# Patient Record
Sex: Male | Born: 1955 | Race: Black or African American | Hispanic: No | Smoking: Current some day smoker
Health system: Southern US, Community
[De-identification: ages and names within clinical notes are randomized; demographics above are authoritative.]

## PROBLEM LIST (undated history)

## (undated) DIAGNOSIS — R5383 Other fatigue: Secondary | ICD-10-CM

## (undated) DIAGNOSIS — R55 Syncope and collapse: Secondary | ICD-10-CM

## (undated) DIAGNOSIS — K219 Gastro-esophageal reflux disease without esophagitis: Secondary | ICD-10-CM

## (undated) DIAGNOSIS — Z87891 Personal history of nicotine dependence: Secondary | ICD-10-CM

## (undated) DIAGNOSIS — Q828 Other specified congenital malformations of skin: Secondary | ICD-10-CM

## (undated) DIAGNOSIS — R21 Rash and other nonspecific skin eruption: Secondary | ICD-10-CM

## (undated) DIAGNOSIS — R5381 Other malaise: Secondary | ICD-10-CM

## (undated) DIAGNOSIS — I10 Essential (primary) hypertension: Secondary | ICD-10-CM

## (undated) DIAGNOSIS — S022XXA Fracture of nasal bones, initial encounter for closed fracture: Secondary | ICD-10-CM

## (undated) DIAGNOSIS — R259 Unspecified abnormal involuntary movements: Secondary | ICD-10-CM

## (undated) DIAGNOSIS — S139XXA Sprain of joints and ligaments of unspecified parts of neck, initial encounter: Secondary | ICD-10-CM

## (undated) DIAGNOSIS — M5412 Radiculopathy, cervical region: Secondary | ICD-10-CM

## (undated) DIAGNOSIS — R209 Unspecified disturbances of skin sensation: Secondary | ICD-10-CM

## (undated) DIAGNOSIS — F1020 Alcohol dependence, uncomplicated: Secondary | ICD-10-CM

## (undated) DIAGNOSIS — M79609 Pain in unspecified limb: Secondary | ICD-10-CM

## (undated) DIAGNOSIS — J45909 Unspecified asthma, uncomplicated: Secondary | ICD-10-CM

## (undated) HISTORY — DX: Other specified congenital malformations of skin: Q82.8

## (undated) HISTORY — DX: Essential (primary) hypertension: I10

## (undated) HISTORY — DX: Radiculopathy, cervical region: M54.12

## (undated) HISTORY — DX: Other fatigue: R53.83

## (undated) HISTORY — DX: Fracture of nasal bones, initial encounter for closed fracture: S02.2XXA

## (undated) HISTORY — DX: Alcohol dependence, uncomplicated: F10.20

## (undated) HISTORY — DX: Unspecified abnormal involuntary movements: R25.9

## (undated) HISTORY — DX: Syncope and collapse: R55

## (undated) HISTORY — DX: Unspecified disturbances of skin sensation: R20.9

## (undated) HISTORY — DX: Unspecified asthma, uncomplicated: J45.909

## (undated) HISTORY — DX: Personal history of nicotine dependence: Z87.891

## (undated) HISTORY — DX: Sprain of joints and ligaments of unspecified parts of neck, initial encounter: S13.9XXA

## (undated) HISTORY — DX: Other malaise: R53.81

## (undated) HISTORY — DX: Rash and other nonspecific skin eruption: R21

## (undated) HISTORY — DX: Pain in unspecified limb: M79.609

## (undated) HISTORY — DX: Gastro-esophageal reflux disease without esophagitis: K21.9

---

## 2004-10-27 ENCOUNTER — Ambulatory Visit: Payer: Self-pay | Admitting: Internal Medicine

## 2005-05-03 ENCOUNTER — Ambulatory Visit: Payer: Self-pay | Admitting: Internal Medicine

## 2005-08-03 ENCOUNTER — Ambulatory Visit: Payer: Self-pay | Admitting: Internal Medicine

## 2006-09-05 ENCOUNTER — Ambulatory Visit: Payer: Self-pay | Admitting: Internal Medicine

## 2006-09-05 LAB — CONVERTED CEMR LAB
BUN: 7 mg/dL (ref 6–23)
Basophils Absolute: 0 10*3/uL (ref 0.0–0.1)
CO2: 24 meq/L (ref 19–32)
Calcium: 10.1 mg/dL (ref 8.4–10.5)
Chloride: 96 meq/L (ref 96–112)
Creatinine, Ser: 1.1 mg/dL (ref 0.4–1.5)
H Pylori IgG: NEGATIVE
Hemoglobin: 14.5 g/dL (ref 13.0–17.0)
Leukocytes, UA: NEGATIVE
Lymphocytes Relative: 14 % (ref 12.0–46.0)
MCHC: 33.5 g/dL (ref 30.0–36.0)
MCV: 92.7 fL (ref 78.0–100.0)
Monocytes Absolute: 1 10*3/uL — ABNORMAL HIGH (ref 0.2–0.7)
Monocytes Relative: 9.7 % (ref 3.0–11.0)
Neutrophils Relative %: 75.3 % (ref 43.0–77.0)
Potassium: 3.3 meq/L — ABNORMAL LOW (ref 3.5–5.1)
TSH: 1.22 microintl units/mL (ref 0.35–5.50)
Total Protein, Urine: 100 mg/dL — AB
Urine Glucose: NEGATIVE mg/dL
Urobilinogen, UA: 0.2 (ref 0.0–1.0)

## 2006-09-15 ENCOUNTER — Ambulatory Visit: Payer: Self-pay | Admitting: Internal Medicine

## 2007-04-18 ENCOUNTER — Ambulatory Visit: Payer: Self-pay | Admitting: Internal Medicine

## 2007-10-15 ENCOUNTER — Ambulatory Visit: Payer: Self-pay | Admitting: Internal Medicine

## 2007-10-15 DIAGNOSIS — K219 Gastro-esophageal reflux disease without esophagitis: Secondary | ICD-10-CM | POA: Insufficient documentation

## 2007-10-16 LAB — CONVERTED CEMR LAB
Albumin: 4.4 g/dL (ref 3.5–5.2)
Alkaline Phosphatase: 77 units/L (ref 39–117)
BUN: 7 mg/dL (ref 6–23)
Basophils Absolute: 0 10*3/uL (ref 0.0–0.1)
Bilirubin Urine: NEGATIVE
Eosinophils Absolute: 0 10*3/uL (ref 0.0–0.6)
GFR calc Af Amer: 102 mL/min
HDL: 74.7 mg/dL (ref >39.0)
Hemoglobin, Urine: NEGATIVE
Hemoglobin: 13.4 g/dL (ref 13.0–17.0)
Leukocytes, UA: NEGATIVE
Lymphocytes Relative: 17.4 % (ref 12.0–46.0)
MCHC: 33.5 g/dL (ref 30.0–36.0)
Monocytes Absolute: 0.6 10*3/uL (ref 0.2–0.7)
Monocytes Relative: 8.5 % (ref 3.0–11.0)
Neutro Abs: 4.9 10*3/uL (ref 1.4–7.7)
Potassium: 3.5 meq/L (ref 3.5–5.1)
TSH: 0.94 microintl units/mL (ref 0.35–5.50)
Triglycerides: 199 mg/dL — ABNORMAL HIGH (ref 0–149)
VLDL: 40 mg/dL (ref 0–40)
pH: 5 (ref 5.0–8.0)

## 2007-10-24 ENCOUNTER — Telehealth: Payer: Self-pay | Admitting: Internal Medicine

## 2007-12-21 ENCOUNTER — Encounter: Payer: Self-pay | Admitting: Internal Medicine

## 2007-12-21 ENCOUNTER — Ambulatory Visit: Payer: Self-pay | Admitting: Internal Medicine

## 2007-12-27 ENCOUNTER — Ambulatory Visit: Payer: Self-pay | Admitting: Internal Medicine

## 2008-01-16 ENCOUNTER — Encounter: Payer: Self-pay | Admitting: Internal Medicine

## 2008-01-23 ENCOUNTER — Encounter: Payer: Self-pay | Admitting: Internal Medicine

## 2008-01-29 ENCOUNTER — Ambulatory Visit: Payer: Self-pay | Admitting: Internal Medicine

## 2008-03-13 ENCOUNTER — Encounter: Payer: Self-pay | Admitting: Internal Medicine

## 2008-04-02 ENCOUNTER — Ambulatory Visit: Payer: Self-pay | Admitting: Internal Medicine

## 2008-05-02 ENCOUNTER — Ambulatory Visit: Payer: Self-pay | Admitting: Internal Medicine

## 2008-09-17 ENCOUNTER — Telehealth: Payer: Self-pay | Admitting: Internal Medicine

## 2008-09-22 ENCOUNTER — Ambulatory Visit: Payer: Self-pay | Admitting: Internal Medicine

## 2008-11-19 ENCOUNTER — Telehealth: Payer: Self-pay | Admitting: Internal Medicine

## 2009-07-14 ENCOUNTER — Ambulatory Visit: Payer: Self-pay | Admitting: Internal Medicine

## 2009-09-06 ENCOUNTER — Inpatient Hospital Stay (HOSPITAL_COMMUNITY): Admission: EM | Admit: 2009-09-06 | Discharge: 2009-09-09 | Payer: Self-pay | Admitting: Emergency Medicine

## 2009-09-15 ENCOUNTER — Ambulatory Visit: Payer: Self-pay | Admitting: Internal Medicine

## 2009-09-17 LAB — CONVERTED CEMR LAB
Albumin: 4.1 g/dL (ref 3.5–5.2)
Alkaline Phosphatase: 53 units/L (ref 39–117)
Bilirubin, Direct: 0.1 mg/dL (ref 0.0–0.3)
Calcium: 9.8 mg/dL (ref 8.4–10.5)
Creatinine, Ser: 0.9 mg/dL (ref 0.4–1.5)
Folate: >20 ng/mL
GFR calc non Af Amer: 114.25 mL/min (ref >60)
Glucose, Bld: 94 mg/dL (ref 70–99)
Hgb A1c MFr Bld: 5.7 % (ref 4.6–6.5)
Sodium: 142 meq/L (ref 135–145)
Vit D, 25-Hydroxy: 20 ng/mL — ABNORMAL LOW (ref 30–89)

## 2009-09-25 ENCOUNTER — Telehealth: Payer: Self-pay | Admitting: Internal Medicine

## 2009-10-05 ENCOUNTER — Ambulatory Visit: Payer: Self-pay | Admitting: Internal Medicine

## 2010-10-10 ENCOUNTER — Encounter: Payer: Self-pay | Admitting: Neurosurgery

## 2010-10-19 NOTE — Assessment & Plan Note (Signed)
Summary: 2 WK ROV /NWS   Vital Signs:  Patient profile:   55 year old male Weight:      194 pounds Temp:     98.8 degrees F oral Pulse rate:   93 / minute BP sitting:   120 / 78  (left arm)  Vitals Entered By: Tora Perches (October 05, 2009 4:06 PM) CC: f/u Is Patient Diabetic? No   CC:  f/u.  History of Present Illness: F/u hand numbness, UE weakness, nose fracture, fall. Doing better - stays dry.  Preventive Screening-Counseling & Management  Alcohol-Tobacco     Smoking Status: quit  Current Medications (verified): 1)  Azor 5-40 Mg  Tabs (Amlodipine-Olmesartan) .[name].. 1 By Mouth Qd 2)  Vitamin D3 2000 Unit Caps (Cholecalciferol) .... Once Daily 3)  Vitamin-B Complex   Tabs (B Complex Vitamins) .[name].. 1 Qd 4)  Lisinopril 20 Mg Tabs (Lisinopril) .[name].. 1 By Mouth Qd 5)  Norvasc 10 Mg Tabs (Amlodipine Besylate) .[name].. 1 By Mouth Qd  Allergies: 1)  ! Propranolol-Hctz Clinical biochemist) 2)  Zegerid (Omeprazole-Sodium Bicarbonate) 3)  Doxycycline  Past History:  Past Medical History: Reviewed history from 09/15/2009 and no changes required. Asthma Hypertension GERD Tremor Probable alcoholism C-spine contusion 2010 after a fall  Social History: Occupation: Married Former Smoker Alcohol use-yes gin 1/2 pint, stopped 12.10  Physical Exam  General:  NAD Eyes:  No corneal or conjunctival inflammation noted. EOMI. Perrla. Nose:  Not swollen NT Mouth:  Not dry Neck:  Cervical spine is tender to palpation over paraspinal muscles and with the ROM  Lungs:  CTA Heart:  RRR, slight tachy Abdomen:  Bowel sounds positive,abdomen soft and non-tender without masses, organomegaly or hernias noted. Msk:  No deformity or scoliosis noted of thoracic or lumbar spine.   Extremities:  No edema Neurologic:  No hand tremoralert & oriented X3, gait normal, and DTRs symmetrical and normal.   Skin:  Dry Psych:  Oriented X3, not anxious appearing, and not depressed appearing.      Impression & Recommendations:  Problem # 1:  WEAKNESS (ICD-780.79) due to spinal cord contusion Assessment Improved  Problem # 2:  NOSE FRACTURE (ICD-802.0) Assessment: Deteriorated Breathing better than before the fracture   Problem # 3:  PARESTHESIA (ICD-782.0) Assessment: Improved  Problem # 4:  SYNCOPE (ICD-780.2), no reccurence Assessment: Comment Only  Problem # 5:  ALCOHOLISM (ICD-303.90) Assessment: Improved Refusing to attend AA  Problem # 6:  HYPERTENSION (ICD-401.9) Assessment: Comment Only  The following medications were removed from the medication list:    Azor 5-40 Mg Tabs (Amlodipine-olmesartan) .[name]... 1 by mouth qd His updated medication list for this problem includes:    Lisinopril 20 Mg Tabs (Lisinopril) .[name]... 1 by mouth qd    Norvasc 10 Mg Tabs (Amlodipine besylate) .[name]... 1 by mouth qd  Complete Medication List: 1)  Vitamin D3 2000 Unit Caps (Cholecalciferol) .... Once daily 2)  Vitamin-b Complex Tabs (B complex vitamins) .[name].. 1 qd 3)  Lisinopril 20 Mg Tabs (Lisinopril) .[name].. 1 by mouth qd 4)  Norvasc 10 Mg Tabs (Amlodipine besylate) .[name].. 1 by mouth qd 5)  Omeprazole 40 Mg Cpdr (Omeprazole) .[name].. 1 by mouth qam for indigestion  Patient Instructions: 1)  Please schedule a follow-up appointment in 4 months. Prescriptions: OMEPRAZOLE 40 MG CPDR (OMEPRAZOLE) 1 by mouth qam for indigestion  #90 x 3   Entered and Authorized by:   Tresa Garter MD   Signed by:   Tresa Garter MD on 10/05/2009  Method used:   [facility] to        [facility]       [address]       [place], [place]  [address]       Ph: [phone]       Fax: [phone]   RxID:   [id number] NORVASC 10 MG TABS (AMLODIPINE BESYLATE) 1 by mouth qd  #90 x 3   Entered and Authorized by:   Tresa Garter MD   Signed by:   Tresa Garter MD on 10/05/2009   Method used:   Electronically to        [facility]  (retail)       [address]       [place], [place]  [address]       Ph: [phone]       Fax: [phone]   RxID:   [id number] LISINOPRIL 20 MG TABS (LISINOPRIL) 1 by mouth qd  #90 x 3   Entered and Authorized by:   Tresa Garter MD   Signed by:   Tresa Garter MD on 10/05/2009   Method used:   Electronically to        [facility] (retail)       [address]       [place], [place]  [address]       Ph: [phone]       Fax: [phone]   RxID:   [id number]

## 2010-10-19 NOTE — Progress Notes (Signed)
Summary: VITAMIN D  Phone Note Call from Patient   Summary of Call: Patient is requesting to know how much Vit D to take. He has bottle of 2000 international units caps.  Initial call taken by: Lamar Sprinkles, CMA,  September 25, 2009 9:19 AM  Follow-up for Phone Call        1 a day Follow-up by: Tresa Garter MD,  September 25, 2009 1:42 PM  Additional Follow-up for Phone Call Additional follow up Details #1::        Patient wife notified Additional Follow-up by: Rock Nephew CMA,  September 25, 2009 2:31 PM    New/Updated Medications: VITAMIN D3 2000 UNIT CAPS (CHOLECALCIFEROL) once daily

## 2010-12-20 LAB — GLUCOSE, CAPILLARY
Glucose-Capillary: 130 mg/dL — ABNORMAL HIGH (ref 70–99)
Glucose-Capillary: 140 mg/dL — ABNORMAL HIGH (ref 70–99)
Glucose-Capillary: 150 mg/dL — ABNORMAL HIGH (ref 70–99)
Glucose-Capillary: 159 mg/dL — ABNORMAL HIGH (ref 70–99)
Glucose-Capillary: 181 mg/dL — ABNORMAL HIGH (ref 70–99)
Glucose-Capillary: 94 mg/dL (ref 70–99)

## 2010-12-20 LAB — BASIC METABOLIC PANEL
BUN: 6 mg/dL (ref 6–23)
BUN: 6 mg/dL (ref 6–23)
Calcium: 8.1 mg/dL — ABNORMAL LOW (ref 8.4–10.5)
Chloride: 102 mEq/L (ref 96–112)
Chloride: 107 mEq/L (ref 96–112)
Creatinine, Ser: 0.91 mg/dL (ref 0.4–1.5)
Creatinine, Ser: 0.98 mg/dL (ref 0.4–1.5)
Glucose, Bld: 165 mg/dL — ABNORMAL HIGH (ref 70–99)
Potassium: 4.3 mEq/L (ref 3.5–5.1)

## 2010-12-20 LAB — CBC
HCT: 34.8 % — ABNORMAL LOW (ref 39.0–52.0)
HCT: 42.6 % (ref 39.0–52.0)
MCV: 94.5 fL (ref 78.0–100.0)
Platelets: 202 10*3/uL (ref 150–400)
Platelets: 267 10*3/uL (ref 150–400)
RDW: 13.2 % (ref 11.5–15.5)
WBC: 10.2 10*3/uL (ref 4.0–10.5)
WBC: 7.2 10*3/uL (ref 4.0–10.5)

## 2010-12-20 LAB — POCT CARDIAC MARKERS
Myoglobin, poc: 91.2 ng/mL (ref 12–200)
Troponin i, poc: <0.05 ng/mL (ref 0.00–0.09)
Troponin i, poc: <0.05 ng/mL (ref 0.00–0.09)

## 2010-12-20 LAB — DIFFERENTIAL
Basophils Absolute: 0 10*3/uL (ref 0.0–0.1)
Basophils Relative: 0 % (ref 0–1)
Eosinophils Absolute: 0.1 10*3/uL (ref 0.0–0.7)
Eosinophils Relative: 1 % (ref 0–5)
Monocytes Absolute: 0.5 10*3/uL (ref 0.1–1.0)
Monocytes Relative: 5 % (ref 3–12)

## 2010-12-20 LAB — COMPREHENSIVE METABOLIC PANEL
ALT: 32 U/L (ref 0–53)
AST: 60 U/L — ABNORMAL HIGH (ref 0–37)
Albumin: 4.5 g/dL (ref 3.5–5.2)
Alkaline Phosphatase: 78 U/L (ref 39–117)
CO2: 25 mEq/L (ref 19–32)
Chloride: 104 mEq/L (ref 96–112)
Creatinine, Ser: 0.93 mg/dL (ref 0.4–1.5)
GFR calc non Af Amer: >60 mL/min (ref >60)
Potassium: 3.7 mEq/L (ref 3.5–5.1)
Sodium: 141 mEq/L (ref 135–145)
Total Bilirubin: 0.4 mg/dL (ref 0.3–1.2)

## 2010-12-20 LAB — ETHANOL

## 2011-02-01 NOTE — Assessment & Plan Note (Signed)
Johnny Mcknight                                 ON-CALL NOTE   Johnny Mcknight, Johnny Mcknight                           MRN:          [id number]  DATE:03/03/2008                            DOB:          08-Nov-1955    Caller is Johnny Mcknight, his wife.  Phone number is [phone].  Primary is  Dr. Posey Rea.   SUBJECTIVE:  [name] reported to his wife that after a very stressful  day with several stressful events he began having sudden onset of  blurred vision in one of his eyes.  He has also been having problems  lately with difficulty sleeping and intermittent numbness, weakness,  confusion per wife.  She states she does not have any specific symptoms  of unilateral weakness suggesting stroke.  He has no headache.  His  blood pressure has been elevated lately.   ASSESSMENT/PLAN:  Given sudden onset of unilateral vision blurring,  recommended evaluation at an urgent care this evening.  This may be  simply due to blood pressure ratio, but given his age and risk factors,  I recommend neurologic evaluation.  This could also suggest a retinal  vision change.  Wife is in agreement, but patient got on the phone  stating he did not want to go to the urgent care and would wait and talk  to his doctor later in the week.  The wife then returned to the phone  and stated that she was interested in calling back later for a unknown  reason that she would not mention given the fact that her husband was in  the background.     Johnny Nora, MD  Electronically Signed    AB/MedQ  DD: 03/03/2008  DT: 03/03/2008  Job #: [id number]

## 2011-02-04 NOTE — Assessment & Plan Note (Signed)
[facility]                                 ON-CALL NOTE   HALO, LASKI                           MRN:          [id number]  DATE:10/07/2006                            DOB:          04-15-1956    CALLER:  Lenor Coffin.   TIME OF CALL:  On October 06, 2006, at 7:50 a.m.   PHONE NUMBER:  [phone]   REASON FOR CALL:  Diarrhea and lightheadedness.   HISTORY OF PRESENT ILLNESS:  Dewayne Hatch says Pavel is an alcoholic who continues  to drink despite warnings.  He has had some diarrhea today and feels  somewhat lightheaded.  She wants to know if it could be from any of his  medications.  She listed his medications for me.  He is on promethazine.  He was on azithromycin, but stopped it.  Sounds like chlordiazepoxide to  help with alcohol withdrawals, Benicar HCT and Zegerid as well as  vitamin B1.  She said his liver counts were elevated recently.  He sees  Dr. Posey Rea.  I told her that the azithromycin could possibly give him  a little diarrhea and the chlordiazepoxide should not be taken while  drinking use to help with withdrawal symptoms.  I said that his diarrhea  and lightheadedness could be from his liver failure, but it also could  be from a mixture of medications and alcohol and I advised him to stop  drinking alcohol immediately.  I told her if his condition does not  improve, she needs to take him to the emergency room for evaluation, and  if not that, definitely set up a follow up with Dr. Posey Rea.  She  would like him to stop the alcohol and watch him closely.  Again, if his  condition decompensates in any way she will take him to the emergency  room.     Marne A. Tower, MD  Electronically Signed    MAT/MedQ  DD: 10/07/2006  DT: 10/07/2006  Job #: [id number]   cc:   Georgina Quint. Plotnikov, MD  Marne A. Milinda Antis, MD

## 2011-07-21 ENCOUNTER — Telehealth: Payer: Self-pay | Admitting: *Deleted

## 2011-07-21 ENCOUNTER — Other Ambulatory Visit: Payer: Self-pay | Admitting: *Deleted

## 2011-07-21 MED ORDER — LISINOPRIL 20 MG PO TABS: 20.0000 mg | ORAL_TABLET | Freq: Every day | ORAL | Status: DC

## 2011-07-21 MED ORDER — AMLODIPINE BESYLATE 10 MG PO TABS: 10.0000 mg | ORAL_TABLET | Freq: Every day | ORAL | Status: DC

## 2011-07-21 NOTE — Telephone Encounter (Signed)
Pt left vm req Rf on lisinopril 20 mg and amlodipine 10 mg. He has existing OV 08/31/11. Will send Rf # 30 X 1 rf.

## 2011-08-30 DIAGNOSIS — R197 Diarrhea, unspecified: Secondary | ICD-10-CM

## 2011-08-30 DIAGNOSIS — M545 Low back pain, unspecified: Secondary | ICD-10-CM

## 2011-08-31 ENCOUNTER — Ambulatory Visit (INDEPENDENT_AMBULATORY_CARE_PROVIDER_SITE_OTHER): Payer: BC Managed Care – PPO | Admitting: Internal Medicine

## 2011-08-31 ENCOUNTER — Encounter: Payer: Self-pay | Admitting: Internal Medicine

## 2011-08-31 VITALS — BP 138/90 | HR 72 | Temp 98.6°F | Resp 16 | Wt 219.0 lb

## 2011-08-31 DIAGNOSIS — M545 Low back pain, unspecified: Secondary | ICD-10-CM

## 2011-08-31 DIAGNOSIS — F1020 Alcohol dependence, uncomplicated: Secondary | ICD-10-CM

## 2011-08-31 DIAGNOSIS — I10 Essential (primary) hypertension: Secondary | ICD-10-CM

## 2011-08-31 DIAGNOSIS — Z87891 Personal history of nicotine dependence: Secondary | ICD-10-CM

## 2011-08-31 MED ORDER — LISINOPRIL 20 MG PO TABS: 20.0000 mg | ORAL_TABLET | Freq: Every day | ORAL | Status: DC

## 2011-08-31 MED ORDER — MELOXICAM 15 MG PO TABS: 15.0000 mg | ORAL_TABLET | Freq: Every day | ORAL | Status: DC | PRN

## 2011-08-31 MED ORDER — VITAMIN D3 50 MCG (2000 UT) PO TABS: 1.0000 | ORAL_TABLET | ORAL | Status: DC

## 2011-08-31 MED ORDER — AMLODIPINE BESYLATE 10 MG PO TABS: 10.0000 mg | ORAL_TABLET | Freq: Every day | ORAL | Status: DC

## 2011-08-31 NOTE — Assessment & Plan Note (Signed)
Better  

## 2011-08-31 NOTE — Assessment & Plan Note (Addendum)
Meloxicam Xray if worse ROM exercise Start PT To work tomorrow if ok

## 2011-08-31 NOTE — Assessment & Plan Note (Signed)
Continue with current prescription therapy as reflected on the Med list re-started.

## 2011-08-31 NOTE — Assessment & Plan Note (Signed)
He quit in 1980

## 2011-08-31 NOTE — Progress Notes (Signed)
  Subjective:    Patient ID: Johnny Mcknight, male    DOB: [birth year], 55 y.o.   MRN: [id number]  HPI  F/u HTN C/o LBP since Nov 17/12  -severe - he did not work off and on x 2 wks due to severe pain  Review of Systems  Constitutional: Negative for appetite change, fatigue and unexpected weight change.  HENT: Negative for nosebleeds, congestion, sore throat, sneezing, trouble swallowing and neck pain.   Eyes: Negative for itching and visual disturbance.  Respiratory: Negative for cough.   Cardiovascular: Negative for chest pain, palpitations and leg swelling.  Gastrointestinal: Negative for nausea, diarrhea, blood in stool and abdominal distention.  Genitourinary: Negative for frequency and hematuria.  Musculoskeletal: Positive for back pain and gait problem. Negative for joint swelling.  Skin: Negative for rash.  Neurological: Negative for dizziness, tremors, speech difficulty and weakness.  Psychiatric/Behavioral: Negative for sleep disturbance, dysphoric mood and agitation. The patient is not nervous/anxious.        Objective:   Physical Exam  Constitutional: He is oriented to person, place, and time. He appears well-developed.  HENT:  Mouth/Throat: Oropharynx is clear and moist.  Eyes: Conjunctivae are normal. Pupils are equal, round, and reactive to light.  Neck: Normal range of motion. No JVD present. No thyromegaly present.  Cardiovascular: Normal rate, regular rhythm, normal heart sounds and intact distal pulses.  Exam reveals no gallop and no friction rub.   No murmur heard. Pulmonary/Chest: Effort normal and breath sounds normal. No respiratory distress. He has no wheezes. He has no rales. He exhibits no tenderness.  Abdominal: Soft. Bowel sounds are normal. He exhibits no distension and no mass. There is no tenderness. There is no rebound and no guarding.  Musculoskeletal: Normal range of motion. He exhibits tenderness. He exhibits no edema.       LS spine is tender w/ROM    Lymphadenopathy:    He has no cervical adenopathy.  Neurological: He is alert and oriented to person, place, and time. He has normal reflexes. No cranial nerve deficit. He exhibits normal muscle tone. Coordination normal.       Str leg elev is neg B  Skin: Skin is warm and dry. No rash noted.  Psychiatric: He has a normal mood and affect. His behavior is normal. Judgment and thought content normal.          Assessment & Plan:

## 2011-10-25 ENCOUNTER — Ambulatory Visit (INDEPENDENT_AMBULATORY_CARE_PROVIDER_SITE_OTHER): Payer: BC Managed Care – PPO | Admitting: Internal Medicine

## 2011-10-25 ENCOUNTER — Encounter: Payer: Self-pay | Admitting: Internal Medicine

## 2011-10-25 DIAGNOSIS — J069 Acute upper respiratory infection, unspecified: Secondary | ICD-10-CM

## 2011-10-25 DIAGNOSIS — J45909 Unspecified asthma, uncomplicated: Secondary | ICD-10-CM

## 2011-10-25 DIAGNOSIS — F1020 Alcohol dependence, uncomplicated: Secondary | ICD-10-CM

## 2011-10-25 MED ORDER — AMOXICILLIN 500 MG PO CAPS: 1000.0000 mg | ORAL_CAPSULE | Freq: Two times a day (BID) | ORAL | Status: AC

## 2011-10-25 MED ORDER — PROMETHAZINE-CODEINE 6.25-10 MG/5ML PO SYRP: 5.0000 mL | ORAL_SOLUTION | ORAL | Status: AC | PRN

## 2011-10-25 MED ORDER — METHYLPREDNISOLONE ACETATE PF 80 MG/ML IJ SUSP
120.0000 mg | Freq: Once | INTRAMUSCULAR | Status: AC
  Administered 2011-10-25: 120 mg via INTRAMUSCULAR

## 2011-10-25 NOTE — Assessment & Plan Note (Signed)
Amoxicillin Prom-cod

## 2011-10-25 NOTE — Assessment & Plan Note (Signed)
Depomedr 120 mg im 

## 2011-10-25 NOTE — Progress Notes (Signed)
  Subjective:    Patient ID: Johnny Mcknight, male    DOB: 07/27/1956, 56 y.o.   MRN: [id number]  HPI  HPI  C/o URI sx's x 20  days. C/o ST, cough, weakness. Not better with OTC medicines. Actually, the patient is getting worse. The patient did not sleep last night due to cough.  Review of Systems  Constitutional: Positive for fever, chills and fatigue.  HENT: Positive for congestion, rhinorrhea, sneezing and postnasal drip.   Eyes: Positive for photophobia and pain. Negative for discharge and visual disturbance.  Respiratory: Positive for cough and wheezing.   Positive for chest pain.  Gastrointestinal: Negative for vomiting, abdominal pain, diarrhea and abdominal distention.  Genitourinary: Negative for dysuria and difficulty urinating.  Skin: Negative for rash.  Neurological: Positive for dizziness, weakness and light-headedness.       Review of Systems     Objective:   Physical Exam  Constitutional: He is oriented to person, place, and time. He appears well-developed.  HENT:       eryth throat  Eyes: Conjunctivae are normal. Pupils are equal, round, and reactive to light.  Neck: Normal range of motion. No JVD present. No thyromegaly present.  Cardiovascular: Normal rate, regular rhythm, normal heart sounds and intact distal pulses.  Exam reveals no gallop and no friction rub.   No murmur heard. Pulmonary/Chest: Effort normal and breath sounds normal. No respiratory distress. He has no wheezes. He has no rales. He exhibits no tenderness.  Abdominal: Soft. Bowel sounds are normal. He exhibits no distension and no mass. There is no tenderness. There is no rebound and no guarding.  Musculoskeletal: Normal range of motion. He exhibits no edema and no tenderness.  Lymphadenopathy:    He has no cervical adenopathy.  Neurological: He is alert and oriented to person, place, and time. He has normal reflexes. No cranial nerve deficit. He exhibits normal muscle tone. Coordination normal.    Skin: Skin is warm and dry. No rash noted.  Psychiatric: He has a normal mood and affect. His behavior is normal. Judgment and thought content normal.          Assessment & Plan:

## 2011-10-25 NOTE — Patient Instructions (Signed)
Use over-the-counter  "cold" medicines  such as"Afrin" nasal spray for nasal congestion as directed instead. Use" Delsym" or" Robitussin" cough syrup varietis for cough.  You can use plain "Tylenol" or "Advil' for fever, chills and achyness.   

## 2011-10-25 NOTE — Assessment & Plan Note (Signed)
Chronic Dry since 2011

## 2011-10-27 ENCOUNTER — Telehealth: Payer: Self-pay | Admitting: *Deleted

## 2011-10-27 NOTE — Telephone Encounter (Signed)
Pt left vm requesting to know the status of his paperwork to return to work. Have you seen this?

## 2011-10-28 NOTE — Telephone Encounter (Signed)
No

## 2011-10-31 NOTE — Telephone Encounter (Signed)
I spoke to pt- he states its not a form. We just ned to give him one of our return to work slips with his restrictions if any. Will complete return to work slip with any restrictions. Advised pt I will have AVp advise/sign new note and he could p/u later today.

## 2011-12-02 ENCOUNTER — Ambulatory Visit: Payer: BC Managed Care – PPO | Admitting: Internal Medicine

## 2011-12-02 DIAGNOSIS — Z0289 Encounter for other administrative examinations: Secondary | ICD-10-CM

## 2012-03-20 ENCOUNTER — Ambulatory Visit: Payer: BC Managed Care – PPO | Admitting: Internal Medicine

## 2012-03-30 ENCOUNTER — Ambulatory Visit: Payer: BC Managed Care – PPO | Admitting: Internal Medicine

## 2012-04-16 ENCOUNTER — Encounter: Payer: Self-pay | Admitting: Internal Medicine

## 2012-04-16 ENCOUNTER — Ambulatory Visit (INDEPENDENT_AMBULATORY_CARE_PROVIDER_SITE_OTHER)
Admission: RE | Admit: 2012-04-16 | Discharge: 2012-04-16 | Disposition: A | Discharge status: Home / Self Care | Payer: BC Managed Care – PPO | Source: Ambulatory Visit | Attending: Internal Medicine | Admitting: Internal Medicine

## 2012-04-16 ENCOUNTER — Ambulatory Visit (INDEPENDENT_AMBULATORY_CARE_PROVIDER_SITE_OTHER): Payer: BC Managed Care – PPO | Admitting: Internal Medicine

## 2012-04-16 VITALS — BP 120/80 | HR 96 | Temp 98.8°F | Resp 16 | Wt 205.0 lb

## 2012-04-16 DIAGNOSIS — M542 Cervicalgia: Secondary | ICD-10-CM

## 2012-04-16 DIAGNOSIS — I10 Essential (primary) hypertension: Secondary | ICD-10-CM

## 2012-04-16 DIAGNOSIS — F1020 Alcohol dependence, uncomplicated: Secondary | ICD-10-CM

## 2012-04-16 MED ORDER — AMLODIPINE BESYLATE 10 MG PO TABS: 10.0000 mg | ORAL_TABLET | Freq: Every day | ORAL | Status: DC

## 2012-04-16 MED ORDER — LISINOPRIL 20 MG PO TABS: 20.0000 mg | ORAL_TABLET | Freq: Every day | ORAL | Status: DC

## 2012-04-16 MED ORDER — OMEPRAZOLE 40 MG PO CPDR: 40.0000 mg | DELAYED_RELEASE_CAPSULE | Freq: Every day | ORAL | Status: DC

## 2012-04-16 MED ORDER — MELOXICAM 15 MG PO TABS
15.0000 mg | ORAL_TABLET | Freq: Every day | ORAL | Status: AC | PRN
Start: 2012-04-16 — End: 2013-04-16

## 2012-04-16 NOTE — Progress Notes (Signed)
Subjective:    Patient ID: Johnny Mcknight, male    DOB: Nov 13, 1955, 56 y.o.   MRN: [id number]  Gastrophageal Reflux He complains of nausea. He reports no chest pain, no coughing or no sore throat. This is a chronic problem. The problem occurs occasionally. The problem has been waxing and waning. The symptoms are aggravated by ETOH and lying down. Pertinent negatives include no fatigue. Risk factors include ETOH use. He has tried an antacid for the symptoms. The treatment provided mild relief.  Shoulder Pain  The pain is present in the neck and right shoulder. This is a new (x 2 wks) problem. The current episode started 1 to 4 weeks ago (he may have pulled a muscle at work). There has been no history of extremity trauma. The problem occurs constantly. The problem has been gradually worsening. The quality of the pain is described as sharp. The pain is moderate. Pertinent negatives include no fever. The symptoms are aggravated by activity. He has tried NSAIDS for the symptoms. The treatment provided mild relief. Family history does not include gout.    F/u HTN F/u on LBP in Nov 12  -severe - resolved  BP Readings from Last 3 Encounters:  04/16/12 120/80  10/25/11 120/90  08/31/11 138/90   Wt Readings from Last 3 Encounters:  04/16/12 205 lb (92.987 kg)  10/25/11 211 lb (95.709 kg)  08/31/11 219 lb (99.338 kg)      Review of Systems  Constitutional: Negative for fever, appetite change, fatigue and unexpected weight change.  HENT: Negative for nosebleeds, congestion, sore throat, sneezing, trouble swallowing and neck pain.   Eyes: Negative for itching and visual disturbance.  Respiratory: Negative for cough.   Cardiovascular: Negative for chest pain, palpitations and leg swelling.  Gastrointestinal: Positive for nausea and vomiting. Negative for diarrhea, blood in stool and abdominal distention.  Genitourinary: Negative for frequency and hematuria.  Musculoskeletal: Negative for myalgias,  back pain, joint swelling, arthralgias and gait problem.       R neck pain  Skin: Negative for rash.  Neurological: Negative for dizziness, tremors, speech difficulty and weakness.  Psychiatric/Behavioral: Negative for disturbed wake/sleep cycle, dysphoric mood and agitation. The patient is not nervous/anxious.        Objective:   Physical Exam  Constitutional: He is oriented to person, place, and time. He appears well-developed.  HENT:  Mouth/Throat: Oropharynx is clear and moist.  Eyes: Conjunctivae are normal. Pupils are equal, round, and reactive to light.  Neck: Normal range of motion. No JVD present. No thyromegaly present.  Cardiovascular: Normal rate, regular rhythm, normal heart sounds and intact distal pulses.  Exam reveals no gallop and no friction rub.   No murmur heard. Pulmonary/Chest: Effort normal and breath sounds normal. No respiratory distress. He has no wheezes. He has no rales. He exhibits no tenderness.  Abdominal: Soft. Bowel sounds are normal. He exhibits no distension and no mass. There is no tenderness. There is no rebound and no guarding.  Musculoskeletal: Normal range of motion. He exhibits tenderness. He exhibits no edema.       LS cerv is tender w/ROM- tilting the head back - R trap  Lymphadenopathy:    He has no cervical adenopathy.  Neurological: He is alert and oriented to person, place, and time. He has normal reflexes. No cranial nerve deficit. He exhibits normal muscle tone. Coordination normal.       Str leg elev is neg B UEs DTRs, MS -- ok  Skin: Skin is  warm and dry. No rash noted.  Psychiatric: He has a normal mood and affect. His behavior is normal. Judgment and thought content normal.    Lab Results  Component Value Date   WBC 7.2 09/07/2009   HGB 12.0 DELTA CHECK NOTED* 09/07/2009   HCT 34.8* 09/07/2009   PLT 202 DELTA CHECK NOTED 09/07/2009   GLUCOSE 94 09/15/2009   CHOL 179 10/15/2007   TRIG 199* 10/15/2007   HDL 74.7 10/15/2007    LDLCALC 65 10/15/2007   ALT 33 09/15/2009   AST 28 09/15/2009   NA 142 09/15/2009   K 3.7 09/15/2009   CL 105 09/15/2009   CREATININE 0.9 09/15/2009   BUN 9 09/15/2009   CO2 30 09/15/2009   TSH 0.56 09/15/2009   PSA 2.14 10/15/2007   INR 0.91 09/06/2009   HGBA1C 5.7 09/15/2009         Assessment & Plan:

## 2012-04-16 NOTE — Assessment & Plan Note (Signed)
7/13 R -- MSK MSK

## 2012-04-16 NOTE — Assessment & Plan Note (Signed)
Continue with current prescription therapy as reflected on the Med list.  

## 2012-04-16 NOTE — Patient Instructions (Signed)
Contour pillow Exercise for ROM

## 2012-04-16 NOTE — Assessment & Plan Note (Signed)
Drinking was discussed

## 2013-04-26 ENCOUNTER — Ambulatory Visit: Payer: BC Managed Care – PPO | Admitting: Internal Medicine

## 2013-04-26 DIAGNOSIS — Z0289 Encounter for other administrative examinations: Secondary | ICD-10-CM

## 2013-04-27 ENCOUNTER — Encounter (HOSPITAL_COMMUNITY): Payer: Self-pay | Admitting: Nurse Practitioner

## 2013-04-27 ENCOUNTER — Emergency Department (HOSPITAL_COMMUNITY)
Admission: EM | Admit: 2013-04-27 | Discharge: 2013-04-27 | Disposition: A | Discharge status: Home / Self Care | Payer: Medicaid Other | Attending: Emergency Medicine | Admitting: Emergency Medicine

## 2013-04-27 DIAGNOSIS — M545 Low back pain, unspecified: Secondary | ICD-10-CM | POA: Insufficient documentation

## 2013-04-27 DIAGNOSIS — K921 Melena: Secondary | ICD-10-CM | POA: Insufficient documentation

## 2013-04-27 DIAGNOSIS — K644 Residual hemorrhoidal skin tags: Secondary | ICD-10-CM | POA: Insufficient documentation

## 2013-04-27 DIAGNOSIS — Z8659 Personal history of other mental and behavioral disorders: Secondary | ICD-10-CM | POA: Insufficient documentation

## 2013-04-27 DIAGNOSIS — Z87891 Personal history of nicotine dependence: Secondary | ICD-10-CM | POA: Insufficient documentation

## 2013-04-27 DIAGNOSIS — Z872 Personal history of diseases of the skin and subcutaneous tissue: Secondary | ICD-10-CM | POA: Insufficient documentation

## 2013-04-27 DIAGNOSIS — M5432 Sciatica, left side: Secondary | ICD-10-CM

## 2013-04-27 DIAGNOSIS — Z8739 Personal history of other diseases of the musculoskeletal system and connective tissue: Secondary | ICD-10-CM | POA: Insufficient documentation

## 2013-04-27 DIAGNOSIS — Q828 Other specified congenital malformations of skin: Secondary | ICD-10-CM | POA: Insufficient documentation

## 2013-04-27 DIAGNOSIS — M5430 Sciatica, unspecified side: Secondary | ICD-10-CM | POA: Insufficient documentation

## 2013-04-27 DIAGNOSIS — J45909 Unspecified asthma, uncomplicated: Secondary | ICD-10-CM | POA: Insufficient documentation

## 2013-04-27 DIAGNOSIS — Z8719 Personal history of other diseases of the digestive system: Secondary | ICD-10-CM | POA: Insufficient documentation

## 2013-04-27 DIAGNOSIS — Z8781 Personal history of (healed) traumatic fracture: Secondary | ICD-10-CM | POA: Insufficient documentation

## 2013-04-27 DIAGNOSIS — M79609 Pain in unspecified limb: Secondary | ICD-10-CM | POA: Insufficient documentation

## 2013-04-27 DIAGNOSIS — I10 Essential (primary) hypertension: Secondary | ICD-10-CM | POA: Insufficient documentation

## 2013-04-27 DIAGNOSIS — Z79899 Other long term (current) drug therapy: Secondary | ICD-10-CM | POA: Insufficient documentation

## 2013-04-27 LAB — CBC
MCHC: 35.5 g/dL (ref 30.0–36.0)
Platelets: 176 10*3/uL (ref 150–400)
RDW: 12.9 % (ref 11.5–15.5)
WBC: 6.7 10*3/uL (ref 4.0–10.5)

## 2013-04-27 LAB — BASIC METABOLIC PANEL
Chloride: 104 mEq/L (ref 96–112)
GFR calc Af Amer: 80 mL/min — ABNORMAL LOW (ref >90)
GFR calc non Af Amer: 69 mL/min — ABNORMAL LOW (ref >90)
Potassium: 3.7 mEq/L (ref 3.5–5.1)
Sodium: 140 mEq/L (ref 135–145)

## 2013-04-27 LAB — OCCULT BLOOD, POC DEVICE: Fecal Occult Bld: POSITIVE — AB

## 2013-04-27 MED ORDER — PREDNISONE 20 MG PO TABS
60.0000 mg | ORAL_TABLET | Freq: Once | ORAL | Status: AC
  Administered 2013-04-27: 60 mg via ORAL
  Filled 2013-04-27: qty 3

## 2013-04-27 MED ORDER — PREDNISONE 20 MG PO TABS: ORAL_TABLET | ORAL | Status: DC

## 2013-04-27 NOTE — ED Notes (Signed)
JYN:[id number]<AO> Expected date:<BR> Expected time:<BR> Means of arrival:<BR> Comments:<BR> Hold for [name]

## 2013-04-27 NOTE — ED Provider Notes (Signed)
CSN: [id number]     Arrival date & time 04/27/13  1950 History     First MD Initiated Contact with Patient 04/27/13 1957     Chief Complaint  Patient presents with  . Rectal Bleeding  . Back Pain  . Leg Pain   (Consider location/radiation/quality/duration/timing/severity/associated sxs/prior Treatment) HPI Comments: 57 year old male with a past medical history of hypertension presents the emergency department complaining of bloody stool over the past 3 months, worsening this morning. Patient states over the past month he's had a small amount of bright red blood in his stool, this morning he was having a normal bowel movement and noticed that the toilet was filled with a large amount of both bright and dark red blood. His stool is formed and has not had any rectal pain or pain with defecating. Denies abdominal pain, nausea or vomiting. He is never had this problem evaluated in the past. Also complaining of low back pain over the past month, more so towards the left radiating down his left leg. Pain worse when he sits in a truck for long period of time and when he is on his feet for long period of time. Denies numbness or tingling, loss of control of bowels or bladder or saddle anesthesia. No known injury or trauma. She does have a history of neck injury back in 2010 but did not hurt his back at that time.  The history is provided by the spouse and the patient.    Past Medical History  Diagnosis Date  . ALCOHOLISM 01/29/2008  . ARM PAIN, RIGHT 05/02/2008  . ASTHMA 04/18/2007  . CERVICAL RADICULITIS 05/02/2008  . DRY SKIN 10/15/2007  . GERD 10/15/2007  . HYPERTENSION 04/18/2007  . NECK SPRAIN AND STRAIN 09/15/2009  . NOSE FRACTURE 09/15/2009  . PARESTHESIA 05/02/2008  . Rash and other nonspecific skin eruption 04/02/2008  . SYNCOPE 09/15/2009  . TOBACCO USE, QUIT 10/05/2009  . TREMOR 04/18/2007  . WEAKNESS 09/15/2009   History reviewed. No pertinent past surgical history. Family History    Problem Relation Age of Onset  . Hypertension Other   . Heart disease Father 71    MI   History  Substance Use Topics  . Smoking status: Former Games developer  . Smokeless tobacco: Not on file  . Alcohol Use: 0.0 oz/week     Comment: gin 1/2 pint beer, stopped in 2011; 1-2 beers/d in 2013    Review of Systems  Constitutional: Negative for activity change and fatigue.  Gastrointestinal: Positive for blood in stool. Negative for nausea, vomiting, abdominal pain, diarrhea, constipation and rectal pain.  Musculoskeletal: Positive for back pain.  Neurological: Negative for weakness and numbness.  All other systems reviewed and are negative.    Allergies  Doxycycline; Omeprazole-sodium bicarbonate; and Propranolol-hctz  Home Medications   Current Outpatient Rx  Name  Route  Sig  Dispense  Refill  . Aspirin-Acetaminophen-Caffeine (GOODY HEADACHE PO)   Oral   Take 1 packet by mouth every 6 (six) hours as needed.          BP 162/105  Pulse 75  Temp(Src) 98.5 F (36.9 C) (Oral)  Resp 18  Ht 6' (1.829 m)  Wt 203 lb (92.08 kg)  BMI 27.53 kg/m2  SpO2 100% Physical Exam  Nursing note and vitals reviewed. Constitutional: He is oriented to person, place, and time. He appears well-developed and well-nourished. No distress.  HENT:  Head: Normocephalic and atraumatic.  Mouth/Throat: Oropharynx is clear and moist.  Eyes: Conjunctivae and EOM  are normal. Pupils are equal, round, and reactive to light.  Neck: Normal range of motion. Neck supple.  Cardiovascular: Normal rate, regular rhythm and normal heart sounds.   Pulmonary/Chest: Effort normal and breath sounds normal.  Abdominal: Soft. Normal appearance and bowel sounds are normal. He exhibits no distension and no mass. There is no tenderness. There is no rigidity, no rebound and no guarding.  Genitourinary: Rectal exam shows external hemorrhoid. Rectal exam shows no internal hemorrhoid, no mass and no tenderness. Fissure: no  thrombosis or bleeding. Guaiac positive stool.  No gross blood on exam glove.  Musculoskeletal: Normal range of motion. He exhibits no edema.       Lumbar back: He exhibits normal range of motion, no edema, no deformity and normal pulse.       Back:  Neurological: He is alert and oriented to person, place, and time. He has normal strength. No sensory deficit. Gait normal.  Skin: Skin is warm and dry. He is not diaphoretic.  Psychiatric: He has a normal mood and affect. His behavior is normal.    ED Course   Procedures (including critical care time)  Labs Reviewed  CBC - Abnormal; Notable for the following:    RBC 3.84 (*)    Hemoglobin 11.9 (*)    HCT 33.5 (*)    All other components within normal limits  BASIC METABOLIC PANEL - Abnormal; Notable for the following:    GFR calc non Af Amer 69 (*)    GFR calc Af Amer 80 (*)    All other components within normal limits  OCCULT BLOOD, POC DEVICE - Abnormal; Notable for the following:    Fecal Occult Bld POSITIVE (*)    All other components within normal limits   No results found. 1. Bloody stool   2. Sciatica, left     MDM  Patient with bloody stool, asymptomatic. No abdominal pain. No gross blood on exam glove. Hemoccult positive. Hgb 11.9, 12.0 in 2010. He is in NAD. F/u outpatient GI for colonoscopy. Regarding back pain- symptoms of sciatica. No red flags concerning patient's back pain. No s/s of central cord compression or cauda equina. Lower extremities are neurovascularly intact and patient is ambulating without difficulty. Close return precautions discussed in detail regarding both issues. Prednisone for sciatica. He will also f/u with PCP. Patient states understanding of treatment care plan and is agreeable.   Trevor Mace, PA-C 04/27/13 2201

## 2013-04-27 NOTE — ED Notes (Signed)
Per pt:  Pt has had small amounts of blood in stool for about 3 months.  This morning, pt had a large amount of blood in toilet.  Denies abdominal pain, nausea or vomiting.  Pt also c/o lower back pain that radiates down to left leg.

## 2013-04-29 ENCOUNTER — Ambulatory Visit (INDEPENDENT_AMBULATORY_CARE_PROVIDER_SITE_OTHER): Payer: Medicaid Other | Admitting: Internal Medicine

## 2013-04-29 ENCOUNTER — Ambulatory Visit (INDEPENDENT_AMBULATORY_CARE_PROVIDER_SITE_OTHER)
Admission: RE | Admit: 2013-04-29 | Discharge: 2013-04-29 | Disposition: A | Discharge status: Home / Self Care | Payer: Medicaid Other | Source: Ambulatory Visit | Attending: Internal Medicine | Admitting: Internal Medicine

## 2013-04-29 ENCOUNTER — Encounter: Payer: Self-pay | Admitting: Internal Medicine

## 2013-04-29 VITALS — BP 132/102 | HR 72 | Temp 98.5°F | Resp 16 | Wt 202.0 lb

## 2013-04-29 DIAGNOSIS — M5432 Sciatica, left side: Secondary | ICD-10-CM

## 2013-04-29 DIAGNOSIS — M5431 Sciatica, right side: Secondary | ICD-10-CM

## 2013-04-29 DIAGNOSIS — M545 Low back pain, unspecified: Secondary | ICD-10-CM

## 2013-04-29 DIAGNOSIS — K921 Melena: Secondary | ICD-10-CM

## 2013-04-29 DIAGNOSIS — M5430 Sciatica, unspecified side: Secondary | ICD-10-CM

## 2013-04-29 MED ORDER — PREDNISONE 10 MG PO TABS: ORAL_TABLET | ORAL | Status: DC

## 2013-04-29 MED ORDER — CYCLOBENZAPRINE HCL 5 MG PO TABS: 5.0000 mg | ORAL_TABLET | Freq: Two times a day (BID) | ORAL | Status: DC | PRN

## 2013-04-29 MED ORDER — TRAMADOL HCL 50 MG PO TABS: 50.0000 mg | ORAL_TABLET | Freq: Two times a day (BID) | ORAL | Status: DC | PRN

## 2013-04-29 NOTE — Assessment & Plan Note (Signed)
S/p ER visit - he is on Prednisone

## 2013-04-29 NOTE — Assessment & Plan Note (Signed)
Xray Prednisone 10 mg: take 4 tabs a day x 3 days; then 3 tabs a day x 4 days; then 2 tabs a day x 4 days, then 1 tab a day x 6 days, then stop. Take pc. Flexeril Exercises

## 2013-04-29 NOTE — Progress Notes (Signed)
  Subjective:    Patient ID: Johnny Mcknight, male    DOB: [birth year], 57 y.o.   MRN: [id number]  Back Pain This is a recurrent problem. The current episode started more than 1 month ago. The problem occurs constantly. The problem is unchanged. The pain is present in the gluteal, lumbar spine and sacro-iliac. The quality of the pain is described as aching. The pain radiates to the left thigh. The pain is at a severity of 7/10. The pain is severe. The symptoms are aggravated by bending and sitting. Pertinent negatives include no fever or headaches.  Rectal Bleeding  The current episode started 3 to 5 days ago. The onset was sudden. Episode frequency: 1-2/d. The problem has been unchanged. The patient is experiencing no pain. The stool is described as soft. Pertinent negatives include no anorexia, no fever, no diarrhea, no rectal pain, no vomiting, no headaches, no difficulty breathing and no rash. He has been eating and drinking normally.      Review of Systems  Constitutional: Negative for fever.  Respiratory: Negative for wheezing and stridor.   Gastrointestinal: Positive for hematochezia. Negative for vomiting, diarrhea, rectal pain and anorexia.  Musculoskeletal: Positive for back pain.  Skin: Negative for rash.  Neurological: Negative for headaches.       Objective:   Physical Exam  Constitutional: He is oriented to person, place, and time. He appears well-developed. No distress.  HENT:  Mouth/Throat: No oropharyngeal exudate.  Neck: No thyromegaly present.  Cardiovascular:  Murmur heard. Abdominal: There is no tenderness. There is no rebound.  Musculoskeletal: He exhibits tenderness (LS spine is tender). He exhibits no edema.  Lymphadenopathy:    He has no cervical adenopathy.  Neurological: He is alert and oriented to person, place, and time. Coordination normal.  Skin: No rash noted. There is erythema.   L strait  leg elev is pos        Assessment & Plan:

## 2013-04-29 NOTE — Assessment & Plan Note (Signed)
R sciatica See meds

## 2013-04-29 NOTE — Assessment & Plan Note (Signed)
GI ref 

## 2013-05-01 NOTE — ED Provider Notes (Signed)
Medical screening examination/treatment/procedure(s) were performed by non-physician practitioner and as supervising physician I was immediately available for consultation/collaboration.  Derwood Kaplan, MD 05/01/13 1422

## 2013-05-02 ENCOUNTER — Telehealth: Payer: Self-pay | Admitting: Gastroenterology

## 2013-05-02 NOTE — Telephone Encounter (Signed)
Patient with 3-5 days of rectal bleeding.  He will come in and see Mike Gip PA 05/03/13 10:00

## 2013-05-03 ENCOUNTER — Encounter: Payer: Self-pay | Admitting: Physician Assistant

## 2013-05-03 ENCOUNTER — Ambulatory Visit (INDEPENDENT_AMBULATORY_CARE_PROVIDER_SITE_OTHER): Payer: Medicaid Other | Admitting: Physician Assistant

## 2013-05-03 VITALS — BP 110/80 | HR 60 | Ht 72.0 in | Wt 204.5 lb

## 2013-05-03 DIAGNOSIS — K921 Melena: Secondary | ICD-10-CM

## 2013-05-03 DIAGNOSIS — D649 Anemia, unspecified: Secondary | ICD-10-CM

## 2013-05-03 MED ORDER — MOVIPREP 100 G PO SOLR: 1.0000 | Freq: Once | ORAL | Status: DC

## 2013-05-03 NOTE — Patient Instructions (Addendum)
You have been scheduled for a colonoscopy with propofol. Please follow written instructions given to you at your visit today.  Please pick up your prep kit at the pharmacy within the next 1-3 days. [facility]. If you use inhalers (even only as needed), please bring them with you on the day of your procedure. Your physician has requested that you go to www.startemmi.com and enter the access code given to you at your visit today. This web site gives a general overview about your procedure. However, you should still follow specific instructions given to you by our office regarding your preparation for the procedure.

## 2013-05-03 NOTE — Progress Notes (Signed)
Subjective:    Patient ID: Johnny Mcknight, male    DOB: [birth year], 57 y.o.   MRN: [id number]  HPI Johnny Mcknight is a pleasant 56 year old African American male new to GI today referred by Dr. Posey Rea for evaluation of new onset of rectal bleeding. Patient has not had any prior GI evaluation. He said he had onset this past weekend noticing bright red blood mixed in with the bowel movement.  Was actually seen in the emergency room and had labs done showing a WBC of 6.7 hemoglobin 11.9 hematocrit of 37.5 MCV of 87. And documented Hemoccult-positive without any gross bleeding He was then seen by Dr. Posey Rea on 811. His also being treated for sciatica. Patient says he had one other episode on Monday, August 11 with a brown bowel movement containing bright red blood. He says he has not noticed any blood since and has not had any melena. He has no complaints of abdominal pain or rectal pain pressure etc. He says his bowel movements have been for the most part normal occasionally has constipation. His appetite has been good his weight has generally been stable. Is not taking any regular aspirin or NSAIDs. He does drink alcohol on a daily basis. Family history is positive for a maternal uncle and cousin's on the maternal side with colon cancer     Review of Systems  Constitutional: Negative.   HENT: Negative.   Eyes: Negative.   Respiratory: Negative.   Cardiovascular: Negative.   Gastrointestinal: Negative.   Endocrine: Negative.   Genitourinary: Negative.   Musculoskeletal: Positive for back pain.  Skin: Negative.   Allergic/Immunologic: Negative.   Neurological: Negative.   Hematological: Negative.   Psychiatric/Behavioral: Negative.    Outpatient Prescriptions Prior to Visit  Medication Sig Dispense Refill  . Aspirin-Acetaminophen-Caffeine (GOODY HEADACHE PO) Take 1 packet by mouth every 6 (six) hours as needed. Took one goodie powder last week for back pain      . cyclobenzaprine (FLEXERIL) 5 MG  tablet Take 1 tablet (5 mg total) by mouth 2 (two) times daily as needed for muscle spasms.  60 tablet  1  . predniSONE (DELTASONE) 10 MG tablet Prednisone 10 mg: take 4 tabs a day x 3 days; then 3 tabs a day x 4 days; then 2 tabs a day x 4 days, then 1 tab a day x 6 days, then stop. Take pc.  38 tablet  1  . traMADol (ULTRAM) 50 MG tablet Take 1-2 tablets (50-100 mg total) by mouth 2 (two) times daily as needed for pain.  60 tablet  1   No facility-administered medications prior to visit.   Allergies  Allergen Reactions  . Doxycycline     REACTION: sleepy  . Omeprazole-Sodium Bicarbonate     REACTION: diarrhea  . Propranolol-Hctz    Patient Active Problem List   Diagnosis Date Noted  . Hematochezia 04/29/2013  . Sciatica of left side 04/29/2013  . Cervical spine pain 04/16/2012  . URI (upper respiratory infection) 10/25/2011  . SYNCOPE 09/15/2009  . WEAKNESS 09/15/2009  . NOSE FRACTURE 09/15/2009  . NECK SPRAIN AND STRAIN 09/15/2009  . CERVICAL RADICULITIS 05/02/2008  . ARM PAIN, RIGHT 05/02/2008  . PARESTHESIA 05/02/2008  . RASH AND OTHER NONSPECIFIC SKIN ERUPTION 04/02/2008  . ALCOHOLISM 01/29/2008  . DIARRHEA 01/29/2008  . LOW BACK PAIN, ACUTE 12/21/2007  . GERD 10/15/2007  . DRY SKIN 10/15/2007  . HYPERTENSION 04/18/2007  . ASTHMA 04/18/2007  . TREMOR 04/18/2007   History  Substance Use Topics  .  Smoking status: Former Games developer  . Smokeless tobacco: Never Used  . Alcohol Use: 0.0 oz/week     Comment: gin 1/2 pint beer, stopped in 2011; 1-2 beers/d in 2013   family history includes Heart disease (age of onset: 9) in his father; Hypertension in his other. Maternal uncle with colon caner     Objective:   Physical Exam well-developed African American male in no acute distress. Blood pressure 110/80 pulse 60 height 6 foot weight 2043 HEENT; nontraumatic normocephalic EOMI PERRLA sclera anicteric, Supple; no JVD, Cardiovascula;r regular rate and rhythm with S1-S2 no  murmur or gallop, Pulmonary; clear bilaterally, Abdomen; soft nontender nondistended bowel sounds are active there is no palpable mass or hepatosplenomegaly, Rectal ;exam not done he had seen Dr. Posey Rea earlier this week he was documented Hemoccult positive, Extremities; no clubbing cyanosis or edema skin warm and dry, Psych; mood and affect normal and appropriate        Assessment & Plan:  #68  57 year old African American male with 2 recent episodes of hematochezia with bright red blood noticed mixed with bowel movement. Patient also has a mild normocytic anemia. Will need to rule out occult colon lesion, versus hemorrhoidal bleeding #2 history of alcoholism #3 hypertension #4 sciatica  Plan; patient is scheduled for colonoscopy with Dr. Jarold Motto. Procedure was discussed in detail with the patient and he is agreeable to proceed. Further plans pending findings of above.

## 2013-05-09 ENCOUNTER — Encounter: Payer: Self-pay | Admitting: Internal Medicine

## 2013-05-10 ENCOUNTER — Encounter: Payer: Self-pay | Admitting: Internal Medicine

## 2013-05-10 ENCOUNTER — Ambulatory Visit (INDEPENDENT_AMBULATORY_CARE_PROVIDER_SITE_OTHER): Payer: Medicaid Other | Admitting: Internal Medicine

## 2013-05-10 VITALS — BP 130/82 | HR 80 | Temp 98.7°F | Resp 16 | Wt 200.0 lb

## 2013-05-10 DIAGNOSIS — M545 Low back pain, unspecified: Secondary | ICD-10-CM

## 2013-05-10 DIAGNOSIS — M79609 Pain in unspecified limb: Secondary | ICD-10-CM

## 2013-05-10 DIAGNOSIS — G8929 Other chronic pain: Secondary | ICD-10-CM

## 2013-05-10 DIAGNOSIS — M79604 Pain in right leg: Secondary | ICD-10-CM

## 2013-05-10 MED ORDER — BUPRENORPHINE 15 MCG/HR TD PTWK: 1.0000 | MEDICATED_PATCH | TRANSDERMAL | Status: DC

## 2013-05-10 MED ORDER — METHYLPREDNISOLONE ACETATE 80 MG/ML IJ SUSP
120.0000 mg | Freq: Once | INTRAMUSCULAR | Status: AC
  Administered 2013-05-10: 120 mg via INTRAMUSCULAR

## 2013-05-10 NOTE — Assessment & Plan Note (Signed)
Poss spinal stenosis 8/14 Refractory pain MRI Depomedrol 80 mg im Butrans patch

## 2013-05-10 NOTE — Assessment & Plan Note (Signed)
Poss spinal stenosis 8/14 Refractory pain MRI Depomedrol 80 mg im Butrans patch  

## 2013-05-10 NOTE — Assessment & Plan Note (Signed)
Not better - irrad to B legs

## 2013-05-10 NOTE — Progress Notes (Signed)
   Subjective:    Back Pain This is a chronic problem. The current episode started more than 1 month ago. The problem occurs constantly. The problem is unchanged. The pain is present in the gluteal, lumbar spine and sacro-iliac. The quality of the pain is described as aching. The pain radiates to the left thigh, left knee, right foot, right knee, right thigh and left foot. The pain is at a severity of 7/10. The pain is severe. The symptoms are aggravated by bending and sitting. Pertinent negatives include no fever or headaches.  Rectal Bleeding  The current episode started more than 2 weeks ago. The onset was sudden. The problem occurs rarely (1-2/d). The problem has been rapidly improving. The patient is experiencing no pain. The stool is described as soft. Pertinent negatives include no anorexia, no fever, no diarrhea, no rectal pain, no vomiting, no headaches, no difficulty breathing and no rash. He has been eating and drinking normally.      Review of Systems  Constitutional: Negative for fever.  Respiratory: Negative for wheezing and stridor.   Gastrointestinal: Positive for hematochezia. Negative for vomiting, diarrhea, rectal pain and anorexia.  Musculoskeletal: Positive for back pain.  Skin: Negative for rash.  Neurological: Negative for headaches.       Objective:   Physical Exam  Constitutional: He is oriented to person, place, and time. He appears well-developed. No distress.  HENT:  Mouth/Throat: No oropharyngeal exudate.  Neck: No thyromegaly present.  Cardiovascular:  Murmur heard. Abdominal: There is no tenderness. There is no rebound.  Musculoskeletal: He exhibits tenderness (LS spine is tender). He exhibits no edema.  Lymphadenopathy:    He has no cervical adenopathy.  Neurological: He is alert and oriented to person, place, and time. Coordination normal.  Skin: No rash noted. There is erythema.   L>R strait  leg elev is pos        Assessment & Plan:

## 2013-05-13 ENCOUNTER — Telehealth: Payer: Self-pay | Admitting: *Deleted

## 2013-05-13 ENCOUNTER — Telehealth: Payer: Self-pay | Admitting: Gastroenterology

## 2013-05-13 NOTE — Telephone Encounter (Signed)
Pt called requesting a call back earlier about Butrans savings card that PCP gave pt at last OV. I called her back and left a detailed mess informing her to call the [phone] help number on the savings card itself.

## 2013-05-13 NOTE — Telephone Encounter (Signed)
Left message for patient to call office back.

## 2013-05-15 ENCOUNTER — Encounter: Payer: Self-pay | Admitting: Gastroenterology

## 2013-05-15 ENCOUNTER — Ambulatory Visit (AMBULATORY_SURGERY_CENTER): Payer: Medicaid Other | Admitting: Gastroenterology

## 2013-05-15 VITALS — BP 135/89 | HR 67 | Temp 97.9°F | Resp 12 | Ht 72.0 in | Wt 200.0 lb

## 2013-05-15 DIAGNOSIS — K649 Unspecified hemorrhoids: Secondary | ICD-10-CM

## 2013-05-15 DIAGNOSIS — Z1211 Encounter for screening for malignant neoplasm of colon: Secondary | ICD-10-CM

## 2013-05-15 DIAGNOSIS — K921 Melena: Secondary | ICD-10-CM

## 2013-05-15 DIAGNOSIS — K5730 Diverticulosis of large intestine without perforation or abscess without bleeding: Secondary | ICD-10-CM

## 2013-05-15 MED ORDER — SODIUM CHLORIDE 0.9 % IV SOLN: 500.0000 mL | INTRAVENOUS | Status: DC

## 2013-05-15 NOTE — Progress Notes (Signed)
Patient did not experience any of the following events: a burn prior to discharge; a fall within the facility; wrong site/side/patient/procedure/implant event; or a hospital transfer or hospital admission upon discharge from the facility. (G8907) Patient did not have preoperative order for IV antibiotic SSI prophylaxis. (G8918)  

## 2013-05-15 NOTE — Op Note (Signed)
[facility] [address] [place], [address]   COLONOSCOPY PROCEDURE REPORT  PATIENT: Mitsuru, Dault  MR#: [id number] BIRTHDATE: 1956/01/11 , 57  yrs. old GENDER: Male ENDOSCOPIST: Mardella Layman, MD, [facility] REFERRED BY:Amy Esterwood, PA-C PROCEDURE DATE:  05/15/2013 PROCEDURE:   Colonoscopy, diagnostic and Colonoscopy, screening First Screening Colonoscopy - Avg.  risk and is 50 yrs.  old or older Yes.  Prior Negative Screening - Now for repeat screening. N/A  History of Adenoma - Now for follow-up colonoscopy & has been > or = to 3 yrs.  N/A ASA CLASS:   Class III INDICATIONS:Rectal Bleeding. MEDICATIONS: propofol (Diprivan) 300mg  IV  DESCRIPTION OF PROCEDURE:   After the risks benefits and alternatives of the procedure were thoroughly explained, informed consent was obtained.  A digital rectal exam revealed external hemorrhoids.   The LB [id number]  endoscope was introduced through the anus and advanced to the cecum, which was identified by both the appendix and ileocecal valve. No adverse events experienced.   The quality of the prep was excellent, using MoviPrep  The instrument was then slowly withdrawn as the colon was fully examined.      COLON FINDINGS: There was moderate diverticulosis noted in the descending colon and sigmoid colon with associated muscular hypertrophy and petechiae.   The colon was otherwise normal.  There was no diverticulosis, inflammation, polyps or cancers unless previously stated.  Retroflexed views revealed no abnormalities. The time to cecum=3 minutes 10 seconds.  Withdrawal time=6 minutes 00 seconds.  The scope was withdrawn and the procedure completed. COMPLICATIONS: There were no complications.  ENDOSCOPIC IMPRESSION: 1.   There was moderate diverticulosis noted in the descending colon and sigmoid colon 2.   The colon was otherwise normal ..no polyps noted and no heme noted...  RECOMMENDATIONS: 1.  Continue  current medications 2.  High fiber diet 3.   Primary care f/u  eSigned:  Mardella Layman, MD, [facility] 05/15/2013 1:55 PM   cc: Linda Hedges.  Plotnikov, MD   PATIENT NAME:  Jeanpierre, Thebeau MR#: [id number]

## 2013-05-15 NOTE — Patient Instructions (Addendum)

## 2013-05-16 ENCOUNTER — Telehealth: Payer: Self-pay | Admitting: *Deleted

## 2013-05-16 NOTE — Telephone Encounter (Signed)
  Follow up Call-  Call back number 05/15/2013  Post procedure Call Back phone  # [phone]  Permission to leave phone message Yes     Left message on answering machie to call us back if experiencing problems or has questions

## 2013-05-17 ENCOUNTER — Ambulatory Visit
Admission: RE | Admit: 2013-05-17 | Discharge: 2013-05-17 | Disposition: A | Discharge status: Home / Self Care | Payer: No Typology Code available for payment source | Source: Ambulatory Visit | Attending: Internal Medicine | Admitting: Internal Medicine

## 2013-05-17 DIAGNOSIS — M545 Low back pain, unspecified: Secondary | ICD-10-CM

## 2013-05-17 DIAGNOSIS — G8929 Other chronic pain: Secondary | ICD-10-CM

## 2013-05-17 DIAGNOSIS — M79604 Pain in right leg: Secondary | ICD-10-CM

## 2013-05-22 ENCOUNTER — Other Ambulatory Visit: Payer: Self-pay | Admitting: Internal Medicine

## 2013-05-22 DIAGNOSIS — M545 Low back pain, unspecified: Secondary | ICD-10-CM

## 2013-05-27 ENCOUNTER — Ambulatory Visit: Payer: BC Managed Care – PPO | Admitting: Gastroenterology

## 2013-06-03 ENCOUNTER — Encounter: Payer: Self-pay | Admitting: Internal Medicine

## 2013-06-03 ENCOUNTER — Ambulatory Visit (INDEPENDENT_AMBULATORY_CARE_PROVIDER_SITE_OTHER): Payer: Medicaid Other | Admitting: Internal Medicine

## 2013-06-03 VITALS — BP 160/94 | HR 72 | Temp 98.6°F | Resp 14 | Wt 198.0 lb

## 2013-06-03 DIAGNOSIS — M545 Low back pain, unspecified: Secondary | ICD-10-CM

## 2013-06-03 DIAGNOSIS — I10 Essential (primary) hypertension: Secondary | ICD-10-CM

## 2013-06-03 MED ORDER — HYDROCODONE-ACETAMINOPHEN 7.5-325 MG PO TABS: 1.0000 | ORAL_TABLET | Freq: Four times a day (QID) | ORAL | Status: DC | PRN

## 2013-06-03 MED ORDER — IBUPROFEN 600 MG PO TABS: 600.0000 mg | ORAL_TABLET | Freq: Three times a day (TID) | ORAL | Status: DC | PRN

## 2013-06-03 MED ORDER — HYDROCHLOROTHIAZIDE 25 MG PO TABS: 25.0000 mg | ORAL_TABLET | Freq: Every day | ORAL | Status: DC

## 2013-06-03 NOTE — Assessment & Plan Note (Signed)
BP Readings from Last 3 Encounters:  06/03/13 160/94  05/15/13 135/89  05/10/13 130/82  start HCTZ NAS diet

## 2013-06-03 NOTE — Progress Notes (Signed)
   Subjective:    Back Pain This is a chronic problem. The current episode started more than 1 month ago. The problem occurs constantly. The problem is unchanged. The pain is present in the gluteal, lumbar spine and sacro-iliac. The quality of the pain is described as aching. The pain radiates to the left thigh, left knee, right foot, right knee, right thigh and left foot. The pain is at a severity of 7/10. The pain is severe. The symptoms are aggravated by bending and sitting. Pertinent negatives include no fever or headaches. The treatment provided no relief.  Rectal Bleeding  The current episode started more than 2 weeks ago. The onset was sudden. The problem occurs rarely (1-2/d). The problem has been rapidly improving. The patient is experiencing no pain. The stool is described as soft. Pertinent negatives include no anorexia, no fever, no diarrhea, no rectal pain, no vomiting, no headaches, no difficulty breathing and no rash. He has been eating and drinking normally. His past medical history does not include inflammatory bowel disease.      Review of Systems  Constitutional: Negative for fever.  Respiratory: Negative for wheezing and stridor.   Gastrointestinal: Positive for hematochezia. Negative for vomiting, diarrhea, rectal pain and anorexia.  Musculoskeletal: Positive for back pain.  Skin: Negative for rash.  Neurological: Negative for headaches.       Objective:   Physical Exam  Constitutional: He is oriented to person, place, and time. He appears well-developed. No distress.  HENT:  Mouth/Throat: No oropharyngeal exudate.  Neck: No thyromegaly present.  Cardiovascular:  Murmur heard. Abdominal: There is no tenderness. There is no rebound.  Musculoskeletal: He exhibits tenderness (LS spine is tender). He exhibits no edema.  Lymphadenopathy:    He has no cervical adenopathy.  Neurological: He is alert and oriented to person, place, and time. Coordination normal.  Skin:  No rash noted. There is erythema.   L>R strait  leg elev is pos   LS MRI     Assessment & Plan:

## 2013-06-03 NOTE — Assessment & Plan Note (Addendum)
05/18/13 MRI: IMPRESSION:  1. 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  2. Broad-based disc bulge at L4-5 results in right greater than  left lateral recess narrowing. Either L5 root could be affected.  The thecal sac appears patent.  Original Report Authenticated By: Holley Dexter, M.D.   NS appt is pending Pt states - no better w/Rx  Change to Vicodin prn Will try PT

## 2014-01-10 ENCOUNTER — Ambulatory Visit: Payer: Medicaid Other | Admitting: Internal Medicine

## 2014-06-09 ENCOUNTER — Encounter: Payer: Self-pay | Admitting: Internal Medicine

## 2014-06-09 ENCOUNTER — Ambulatory Visit: Payer: Self-pay | Attending: Internal Medicine | Admitting: Internal Medicine

## 2014-06-09 VITALS — BP 140/80 | HR 85 | Temp 98.0°F | Resp 16 | Wt 197.0 lb

## 2014-06-09 DIAGNOSIS — R0981 Nasal congestion: Secondary | ICD-10-CM

## 2014-06-09 DIAGNOSIS — G8929 Other chronic pain: Secondary | ICD-10-CM | POA: Insufficient documentation

## 2014-06-09 DIAGNOSIS — Z87891 Personal history of nicotine dependence: Secondary | ICD-10-CM | POA: Insufficient documentation

## 2014-06-09 DIAGNOSIS — R030 Elevated blood-pressure reading, without diagnosis of hypertension: Secondary | ICD-10-CM

## 2014-06-09 DIAGNOSIS — J3489 Other specified disorders of nose and nasal sinuses: Secondary | ICD-10-CM | POA: Insufficient documentation

## 2014-06-09 DIAGNOSIS — M545 Low back pain, unspecified: Secondary | ICD-10-CM | POA: Insufficient documentation

## 2014-06-09 DIAGNOSIS — I10 Essential (primary) hypertension: Secondary | ICD-10-CM | POA: Insufficient documentation

## 2014-06-09 DIAGNOSIS — IMO0001 Reserved for inherently not codable concepts without codable children: Secondary | ICD-10-CM

## 2014-06-09 DIAGNOSIS — Z139 Encounter for screening, unspecified: Secondary | ICD-10-CM

## 2014-06-09 LAB — CBC WITH DIFFERENTIAL/PLATELET
BASOS ABS: 0 10*3/uL (ref 0.0–0.1)
BASOS PCT: 0 % (ref 0–1)
EOS PCT: 1 % (ref 0–5)
Eosinophils Absolute: 0.1 10*3/uL (ref 0.0–0.7)
HEMATOCRIT: 38.3 % — AB (ref 39.0–52.0)
Hemoglobin: 13.6 g/dL (ref 13.0–17.0)
LYMPHS PCT: 14 % (ref 12–46)
Lymphs Abs: 1 10*3/uL (ref 0.7–4.0)
MCH: 32.1 pg (ref 26.0–34.0)
MCHC: 35.5 g/dL (ref 30.0–36.0)
MCV: 90.3 fL (ref 78.0–100.0)
MONO ABS: 0.7 10*3/uL (ref 0.1–1.0)
Monocytes Relative: 10 % (ref 3–12)
NEUTROS ABS: 5.6 10*3/uL (ref 1.7–7.7)
Neutrophils Relative %: 75 % (ref 43–77)
PLATELETS: 210 10*3/uL (ref 150–400)
RBC: 4.24 MIL/uL (ref 4.22–5.81)
RDW: 14.3 % (ref 11.5–15.5)
WBC: 7.4 10*3/uL (ref 4.0–10.5)

## 2014-06-09 LAB — COMPLETE METABOLIC PANEL WITH GFR
ALK PHOS: 98 U/L (ref 39–117)
ALT: 37 U/L (ref 0–53)
AST: 56 U/L — AB (ref 0–37)
Albumin: 4.6 g/dL (ref 3.5–5.2)
BUN: 7 mg/dL (ref 6–23)
CO2: 23 mEq/L (ref 19–32)
CREATININE: 0.96 mg/dL (ref 0.50–1.35)
Calcium: 9.4 mg/dL (ref 8.4–10.5)
Chloride: 104 mEq/L (ref 96–112)
GFR, Est African American: >89 mL/min
GFR, Est Non African American: 88 mL/min
Glucose, Bld: 107 mg/dL — ABNORMAL HIGH (ref 70–99)
POTASSIUM: 4 meq/L (ref 3.5–5.3)
Sodium: 141 mEq/L (ref 135–145)
Total Bilirubin: 1.1 mg/dL (ref 0.2–1.2)
Total Protein: 7.6 g/dL (ref 6.0–8.3)

## 2014-06-09 LAB — LIPID PANEL
CHOLESTEROL: 160 mg/dL (ref 0–200)
HDL: 76 mg/dL (ref >39)
LDL Cholesterol: 68 mg/dL (ref 0–99)
Total CHOL/HDL Ratio: 2.1 Ratio
Triglycerides: 80 mg/dL (ref <150)
VLDL: 16 mg/dL (ref 0–40)

## 2014-06-09 LAB — TSH: TSH: 0.981 u[IU]/mL (ref 0.350–4.500)

## 2014-06-09 MED ORDER — TRAMADOL HCL 50 MG PO TABS: 50.0000 mg | ORAL_TABLET | Freq: Three times a day (TID) | ORAL | Status: DC | PRN

## 2014-06-09 MED ORDER — BACLOFEN 10 MG PO TABS: 10.0000 mg | ORAL_TABLET | Freq: Every day | ORAL | Status: DC

## 2014-06-09 MED ORDER — CLONIDINE HCL 0.1 MG PO TABS
0.2000 mg | ORAL_TABLET | Freq: Once | ORAL | Status: AC
  Administered 2014-06-09: 0.2 mg via ORAL

## 2014-06-09 MED ORDER — LISINOPRIL-HYDROCHLOROTHIAZIDE 10-12.5 MG PO TABS: 1.0000 | ORAL_TABLET | Freq: Every day | ORAL | Status: DC

## 2014-06-09 MED ORDER — FLUTICASONE PROPIONATE 50 MCG/ACT NA SUSP: 2.0000 | Freq: Every day | NASAL | Status: DC

## 2014-06-09 NOTE — Progress Notes (Signed)
Patient here to establish care Presents in office today with elevated blood pressure

## 2014-06-09 NOTE — Patient Instructions (Signed)
DASH Eating Plan °DASH stands for "Dietary Approaches to Stop Hypertension." The DASH eating plan is a healthy eating plan that has been shown to reduce high blood pressure (hypertension). Additional health benefits may include reducing the risk of type 2 diabetes mellitus, heart disease, and stroke. The DASH eating plan may also help with weight loss. °WHAT DO I NEED TO KNOW ABOUT THE DASH EATING PLAN? °For the DASH eating plan, you will follow these general guidelines: °· Choose foods with a percent daily value for sodium of less than 5% (as listed on the food label). °· Use salt-free seasonings or herbs instead of table salt or sea salt. °· Check with your health care provider or pharmacist before using salt substitutes. °· Eat lower-sodium products, often labeled as "lower sodium" or "no salt added." °· Eat fresh foods. °· Eat more vegetables, fruits, and low-fat dairy products. °· Choose whole grains. Look for the word "whole" as the first word in the ingredient list. °· Choose fish and skinless chicken or turkey more often than red meat. Limit fish, poultry, and meat to 6 oz (170 g) each day. °· Limit sweets, desserts, sugars, and sugary drinks. °· Choose heart-healthy fats. °· Limit cheese to 1 oz (28 g) per day. °· Eat more home-cooked food and less restaurant, buffet, and fast food. °· Limit fried foods. °· Cook foods using methods other than frying. °· Limit canned vegetables. If you do use them, rinse them well to decrease the sodium. °· When eating at a restaurant, ask that your food be prepared with less salt, or no salt if possible. °WHAT FOODS CAN I EAT? °Seek help from a dietitian for individual calorie needs. °Grains °Whole grain or whole wheat bread. Brown rice. Whole grain or whole wheat pasta. Quinoa, bulgur, and whole grain cereals. Low-sodium cereals. Corn or whole wheat flour tortillas. Whole grain cornbread. Whole grain crackers. Low-sodium crackers. °Vegetables °Fresh or frozen vegetables  (raw, steamed, roasted, or grilled). Low-sodium or reduced-sodium tomato and vegetable juices. Low-sodium or reduced-sodium tomato sauce and paste. Low-sodium or reduced-sodium canned vegetables.  °Fruits °All fresh, canned (in natural juice), or frozen fruits. °Meat and Other Protein Products °Ground beef (85% or leaner), grass-fed beef, or beef trimmed of fat. Skinless chicken or turkey. Ground chicken or turkey. Pork trimmed of fat. All fish and seafood. Eggs. Dried beans, peas, or lentils. Unsalted nuts and seeds. Unsalted canned beans. °Dairy °Low-fat dairy products, such as skim or 1% milk, 2% or reduced-fat cheeses, low-fat ricotta or cottage cheese, or plain low-fat yogurt. Low-sodium or reduced-sodium cheeses. °Fats and Oils °Tub margarines without trans fats. Light or reduced-fat mayonnaise and salad dressings (reduced sodium). Avocado. Safflower, olive, or canola oils. Natural peanut or almond butter. °Other °Unsalted popcorn and pretzels. °The items listed above may not be a complete list of recommended foods or beverages. Contact your dietitian for more options. °WHAT FOODS ARE NOT RECOMMENDED? °Grains °White bread. White pasta. White rice. Refined cornbread. Bagels and croissants. Crackers that contain trans fat. °Vegetables °Creamed or fried vegetables. Vegetables in a cheese sauce. Regular canned vegetables. Regular canned tomato sauce and paste. Regular tomato and vegetable juices. °Fruits °Dried fruits. Canned fruit in light or heavy syrup. Fruit juice. °Meat and Other Protein Products °Fatty cuts of meat. Ribs, chicken wings, bacon, sausage, bologna, salami, chitterlings, fatback, hot dogs, bratwurst, and packaged luncheon meats. Salted nuts and seeds. Canned beans with salt. °Dairy °Whole or 2% milk, cream, half-and-half, and cream cheese. Whole-fat or sweetened yogurt. Full-fat   cheeses or blue cheese. Nondairy creamers and whipped toppings. Processed cheese, cheese spreads, or cheese  curds. °Condiments °Onion and garlic salt, seasoned salt, table salt, and sea salt. Canned and packaged gravies. Worcestershire sauce. Tartar sauce. Barbecue sauce. Teriyaki sauce. Soy sauce, including reduced sodium. Steak sauce. Fish sauce. Oyster sauce. Cocktail sauce. Horseradish. Ketchup and mustard. Meat flavorings and tenderizers. Bouillon cubes. Hot sauce. Tabasco sauce. Marinades. Taco seasonings. Relishes. °Fats and Oils °Butter, stick margarine, lard, shortening, ghee, and bacon fat. Coconut, palm kernel, or palm oils. Regular salad dressings. °Other °Pickles and olives. Salted popcorn and pretzels. °The items listed above may not be a complete list of foods and beverages to avoid. Contact your dietitian for more information. °WHERE CAN I FIND MORE INFORMATION? °[employer], [employer], [employer]: www.nhlbi.nih.gov/health/health-topics/topics/dash/ °Document Released: 08/25/2011 Document Revised: 01/20/2014 Document Reviewed: 07/10/2013 °Exit[employer]® Patient Information ©2015 [employer]. This information is not intended to replace advice given to you by your health care provider. Make sure you discuss any questions you have with your health care provider. ° °

## 2014-06-09 NOTE — Progress Notes (Signed)
Patient Demographics  Johnny Mcknight, is a 58 y.o. male  GEX:[id number]  WNU:[id number]  DOB - 1956/01/14  CC:  Chief Complaint  Patient presents with  . Establish Care       HPI: Johnny Mcknight is a 58 y.o. male here today to establish medical care. Patient has history of hypertension, chronic low back pain, as per patient he has not taken blood pressure medication for several months today's blood pressure was elevated, he was given clonidine and his repeat manual blood pressure is 140/80, denies any headache dizziness chest and shortness of breath patient is alcohol daily, he had a colonoscopy done last year. As per patient he used to see a neurologist for his back problem has history of disc disease, due to insurance issues he could not followed. Patient is in the process to get insurance. Patient has No headache, No chest pain, No abdominal pain - No Nausea, No new weakness tingling or numbness, No Cough - SOB.  Allergies  Allergen Reactions  . Doxycycline     REACTION: sleepy  . Omeprazole-Sodium Bicarbonate     REACTION: diarrhea  . Propranolol     tired   Past Medical History  Diagnosis Date  . ALCOHOLISM 01/29/2008  . ARM PAIN, RIGHT 05/02/2008  . ASTHMA 04/18/2007  . CERVICAL RADICULITIS 05/02/2008  . DRY SKIN 10/15/2007  . GERD 10/15/2007  . HYPERTENSION 04/18/2007  . NECK SPRAIN AND STRAIN 09/15/2009  . NOSE FRACTURE 09/15/2009  . PARESTHESIA 05/02/2008  . Rash and other nonspecific skin eruption 04/02/2008  . SYNCOPE 09/15/2009  . TOBACCO USE, QUIT 10/05/2009  . TREMOR 04/18/2007  . WEAKNESS 09/15/2009   Current Outpatient Prescriptions on File Prior to Visit  Medication Sig Dispense Refill  . cyclobenzaprine (FLEXERIL) 5 MG tablet Take 1 tablet (5 mg total) by mouth 2 (two) times daily as needed for muscle spasms.  60 tablet  1  . hydrochlorothiazide (HYDRODIURIL) 25 MG tablet Take 1 tablet (25 mg total) by mouth daily.  30 tablet  11  . HYDROcodone-acetaminophen  (NORCO) 7.5-325 MG per tablet Take 1 tablet by mouth 4 (four) times daily as needed for pain.  120 tablet  0  . ibuprofen (ADVIL,MOTRIN) 600 MG tablet Take 1 tablet (600 mg total) by mouth every 8 (eight) hours as needed for pain.  90 tablet  1   No current facility-administered medications on file prior to visit.   Family History  Problem Relation Age of Onset  . Hypertension Other   . Heart disease Father 15    MI  . Hypertension Father   . Hypertension Mother   . Diabetes Mother   . Diabetes Maternal Aunt   . Stroke Maternal Aunt   . Diabetes Maternal Grandmother    History   Social History  . Marital Status: Married    Spouse Name: N/A    Number of Children: N/A  . Years of Education: N/A   Occupational History  . Not on file.   Social History Main Topics  . Smoking status: Former Research scientist (life sciences)  . Smokeless tobacco: Never Used  . Alcohol Use: 0.0 oz/week     Comment: gin 1/2 pint beer, stopped in 2011; 1-2 beers/d in 2013  . Drug Use: No  . Sexual Activity: Yes   Other Topics Concern  . Not on file   Social History Narrative  . No narrative on file    Review of Systems: Constitutional: Negative for fever, chills, diaphoresis, activity change, appetite  change and fatigue. HENT: Negative for ear pain, nosebleeds, congestion, facial swelling, rhinorrhea, neck pain, neck stiffness and ear discharge.  Eyes: Negative for pain, discharge, redness, itching and visual disturbance. Respiratory: Negative for cough, choking, chest tightness, shortness of breath, wheezing and stridor.  Cardiovascular: Negative for chest pain, palpitations and leg swelling. Gastrointestinal: Negative for abdominal distention. Genitourinary: Negative for dysuria, urgency, frequency, hematuria, flank pain, decreased urine volume, difficulty urinating and dyspareunia.  Musculoskeletal: Negative for back pain, joint swelling, arthralgia and gait problem. Neurological: Negative for dizziness, tremors,  seizures, syncope, facial asymmetry, speech difficulty, weakness, light-headedness, numbness and headaches.  Hematological: Negative for adenopathy. Does not bruise/bleed easily. Psychiatric/Behavioral: Negative for hallucinations, behavioral problems, confusion, dysphoric mood, decreased concentration and agitation.    Objective:   Filed Vitals:   06/09/14 1142  BP: 140/80  Pulse:   Temp:   Resp:     Physical Exam: Constitutional: Patient appears well-developed and well-nourished. No distress. HENT: Normocephalic, atraumatic, External right and left ear normal. Oropharynx is clear and moist.  Eyes: Conjunctivae and EOM are normal. PERRLA, no scleral icterus. Neck: Normal ROM. Neck supple. No JVD. No tracheal deviation. No thyromegaly. CVS: RRR, S1/S2 +, no murmurs, no gallops, no carotid bruit.  Pulmonary: Effort and breath sounds normal, no stridor, rhonchi, wheezes, rales.  Abdominal: Soft. BS +, no distension, tenderness, rebound or guarding.  Musculoskeletal: Normal range of motion. No edema and no tenderness. Lower lumbar spinal and paraspinal tenderness  Neuro: Alert. Normal reflexes, muscle tone coordination. No cranial nerve deficit. Skin: Skin is warm and dry. No rash noted. Not diaphoretic. No erythema. No pallor. Psychiatric: Normal mood and affect. Behavior, judgment, thought content normal.  Lab Results  Component Value Date   WBC 6.7 04/27/2013   HGB 11.9* 04/27/2013   HCT 33.5* 04/27/2013   MCV 87.2 04/27/2013   PLT 176 04/27/2013   Lab Results  Component Value Date   CREATININE 1.17 04/27/2013   BUN 11 04/27/2013   NA 140 04/27/2013   K 3.7 04/27/2013   CL 104 04/27/2013   CO2 25 04/27/2013    Lab Results  Component Value Date   HGBA1C 5.7 09/15/2009   Lipid Panel     Component Value Date/Time   CHOL 179 10/15/2007 1118   TRIG 199* 10/15/2007 1118   HDL 74.7 10/15/2007 1118   CHOLHDL 2.4 CALC 10/15/2007 1118   VLDL 40 10/15/2007 1118   LDLCALC 65 10/15/2007 1118        Assessment and plan:   1. Elevated blood pressure  - cloNIDine (CATAPRES) tablet 0.2 mg; Take 2 tablets (0.2 mg total) by mouth once.  2. Unspecified essential hypertension Patient used to be on hydrochlorothiazide in the past, his blood pressure is uncontrolled I have started him on lisinopril/hydrochlorothiazide, advised patient for DASH diet.  - lisinopril-hydrochlorothiazide (PRINZIDE,ZESTORETIC) 10-12.5 MG per tablet; Take 1 tablet by mouth daily.  Dispense: 90 tablet; Refill: 3  3. Chronic LBP  - baclofen (LIORESAL) 10 MG tablet; Take 1 tablet (10 mg total) by mouth at bedtime.  Dispense: 30 each; Refill: 2 - traMADol (ULTRAM) 50 MG tablet; Take 1 tablet (50 mg total) by mouth every 8 (eight) hours as needed for moderate pain.  Dispense: 60 tablet; Refill: 0  4. Nasal congestion  - fluticasone (FLONASE) 50 MCG/ACT nasal spray; Place 2 sprays into both nostrils daily.  Dispense: 16 g; Refill: 3  5. Screening Ordered baseline blood work. - CBC with Differential - COMPLETE METABOLIC PANEL WITH GFR -  TSH - Lipid panel - Vit D  25 hydroxy (rtn osteoporosis monitoring)     Health Maintenance -Colonoscopy: uptodate    -Influenza shot patient declines    Return in about 3 months (around 09/08/2014) for hypertension, back pain, BP check in 2 weeks/Nurse Visit.  Lorayne Marek, MD

## 2014-06-10 ENCOUNTER — Telehealth: Payer: Self-pay | Admitting: Emergency Medicine

## 2014-06-10 ENCOUNTER — Telehealth: Payer: Self-pay | Admitting: Internal Medicine

## 2014-06-10 LAB — VITAMIN D 25 HYDROXY (VIT D DEFICIENCY, FRACTURES): VIT D 25 HYDROXY: 13 ng/mL — AB (ref 30–89)

## 2014-06-10 MED ORDER — VITAMIN D (ERGOCALCIFEROL) 1.25 MG (50000 UNIT) PO CAPS: 50000.0000 [IU] | ORAL_CAPSULE | ORAL | Status: DC

## 2014-06-10 NOTE — Telephone Encounter (Signed)
Message copied by Ricci Barker on Tue Jun 10, 2014 10:46 AM ------      Message from: Lorayne Marek      Created: Tue Jun 10, 2014  9:33 AM       Blood work reviewed noticed impaired fasting glucose, call and advise patient for low carbohydrate diet.      noticed low vitamin D, call patient advise to start ergocalciferol 50,000 units once a week for the duration of  12 weeks.       ------

## 2014-06-10 NOTE — Telephone Encounter (Signed)
Left message for pt to call when message received 

## 2014-06-10 NOTE — Telephone Encounter (Signed)
Left message for pt to call for lab results Medication Vitamin D 50,000 units e-scribed to Sharon

## 2014-06-10 NOTE — Telephone Encounter (Signed)
Pt. Called nurse back regarding results. Please f/u with pt.

## 2014-06-20 ENCOUNTER — Telehealth: Payer: Self-pay

## 2014-06-20 NOTE — Telephone Encounter (Signed)
Message copied by Dorothe Pea on Fri Jun 20, 2014  2:27 PM ------      Message from: Lorayne Marek      Created: Tue Jun 10, 2014  9:33 AM       Blood work reviewed noticed impaired fasting glucose, call and advise patient for low carbohydrate diet.      noticed low vitamin D, call patient advise to start ergocalciferol 50,000 units once a week for the duration of  12 weeks.       ------

## 2014-06-20 NOTE — Telephone Encounter (Signed)
Home number disconnected Unable to leave message on cell number

## 2014-06-23 NOTE — Telephone Encounter (Signed)
Pt's wife called stating that his pain has still not gone away. Please f/u with pt.

## 2014-07-18 ENCOUNTER — Ambulatory Visit: Payer: Self-pay | Admitting: Internal Medicine

## 2014-07-28 ENCOUNTER — Ambulatory Visit: Payer: Self-pay | Admitting: Internal Medicine

## 2015-09-11 ENCOUNTER — Telehealth: Payer: Self-pay | Admitting: Internal Medicine

## 2015-09-11 NOTE — Telephone Encounter (Signed)
Pt is wanting to know if he can re establish with you Please advise

## 2015-09-11 NOTE — Telephone Encounter (Signed)
Unfortunately, I'm not able to accept any more new patients at this time.  I'm sorry! Thank you!  

## 2015-09-22 ENCOUNTER — Encounter (HOSPITAL_COMMUNITY): Payer: Self-pay | Admitting: *Deleted

## 2015-09-22 ENCOUNTER — Inpatient Hospital Stay (HOSPITAL_COMMUNITY)
Admission: EM | Admit: 2015-09-22 | Discharge: 2015-10-01 | DRG: 871 | Disposition: A | Discharge status: Home Health | Payer: Medicare HMO | Attending: Internal Medicine | Admitting: Internal Medicine

## 2015-09-22 ENCOUNTER — Emergency Department (HOSPITAL_COMMUNITY): Payer: Medicare HMO

## 2015-09-22 DIAGNOSIS — D696 Thrombocytopenia, unspecified: Secondary | ICD-10-CM | POA: Diagnosis present

## 2015-09-22 DIAGNOSIS — K219 Gastro-esophageal reflux disease without esophagitis: Secondary | ICD-10-CM | POA: Diagnosis not present

## 2015-09-22 DIAGNOSIS — Z79899 Other long term (current) drug therapy: Secondary | ICD-10-CM

## 2015-09-22 DIAGNOSIS — J690 Pneumonitis due to inhalation of food and vomit: Secondary | ICD-10-CM

## 2015-09-22 DIAGNOSIS — D638 Anemia in other chronic diseases classified elsewhere: Secondary | ICD-10-CM | POA: Diagnosis present

## 2015-09-22 DIAGNOSIS — J154 Pneumonia due to other streptococci: Secondary | ICD-10-CM | POA: Diagnosis present

## 2015-09-22 DIAGNOSIS — A403 Sepsis due to Streptococcus pneumoniae: Principal | ICD-10-CM | POA: Diagnosis present

## 2015-09-22 DIAGNOSIS — R000 Tachycardia, unspecified: Secondary | ICD-10-CM | POA: Diagnosis present

## 2015-09-22 DIAGNOSIS — Z87891 Personal history of nicotine dependence: Secondary | ICD-10-CM | POA: Diagnosis not present

## 2015-09-22 DIAGNOSIS — F1020 Alcohol dependence, uncomplicated: Secondary | ICD-10-CM | POA: Diagnosis not present

## 2015-09-22 DIAGNOSIS — R197 Diarrhea, unspecified: Secondary | ICD-10-CM

## 2015-09-22 DIAGNOSIS — B9689 Other specified bacterial agents as the cause of diseases classified elsewhere: Secondary | ICD-10-CM | POA: Diagnosis present

## 2015-09-22 DIAGNOSIS — E872 Acidosis: Secondary | ICD-10-CM | POA: Diagnosis present

## 2015-09-22 DIAGNOSIS — I10 Essential (primary) hypertension: Secondary | ICD-10-CM | POA: Diagnosis present

## 2015-09-22 DIAGNOSIS — Z823 Family history of stroke: Secondary | ICD-10-CM

## 2015-09-22 DIAGNOSIS — R109 Unspecified abdominal pain: Secondary | ICD-10-CM | POA: Diagnosis present

## 2015-09-22 DIAGNOSIS — R634 Abnormal weight loss: Secondary | ICD-10-CM | POA: Diagnosis present

## 2015-09-22 DIAGNOSIS — J45909 Unspecified asthma, uncomplicated: Secondary | ICD-10-CM | POA: Diagnosis present

## 2015-09-22 DIAGNOSIS — R7881 Bacteremia: Secondary | ICD-10-CM | POA: Diagnosis present

## 2015-09-22 DIAGNOSIS — R002 Palpitations: Secondary | ICD-10-CM | POA: Diagnosis not present

## 2015-09-22 DIAGNOSIS — Z8249 Family history of ischemic heart disease and other diseases of the circulatory system: Secondary | ICD-10-CM | POA: Diagnosis not present

## 2015-09-22 DIAGNOSIS — Z833 Family history of diabetes mellitus: Secondary | ICD-10-CM | POA: Diagnosis not present

## 2015-09-22 DIAGNOSIS — F10239 Alcohol dependence with withdrawal, unspecified: Secondary | ICD-10-CM | POA: Diagnosis present

## 2015-09-22 DIAGNOSIS — J9601 Acute respiratory failure with hypoxia: Secondary | ICD-10-CM | POA: Diagnosis present

## 2015-09-22 DIAGNOSIS — M5410 Radiculopathy, site unspecified: Secondary | ICD-10-CM | POA: Diagnosis present

## 2015-09-22 DIAGNOSIS — Z888 Allergy status to other drugs, medicaments and biological substances status: Secondary | ICD-10-CM

## 2015-09-22 DIAGNOSIS — J13 Pneumonia due to Streptococcus pneumoniae: Secondary | ICD-10-CM | POA: Diagnosis not present

## 2015-09-22 DIAGNOSIS — I340 Nonrheumatic mitral (valve) insufficiency: Secondary | ICD-10-CM | POA: Diagnosis not present

## 2015-09-22 DIAGNOSIS — Z881 Allergy status to other antibiotic agents status: Secondary | ICD-10-CM

## 2015-09-22 DIAGNOSIS — B953 Streptococcus pneumoniae as the cause of diseases classified elsewhere: Secondary | ICD-10-CM | POA: Diagnosis present

## 2015-09-22 DIAGNOSIS — F10288 Alcohol dependence with other alcohol-induced disorder: Secondary | ICD-10-CM | POA: Diagnosis present

## 2015-09-22 DIAGNOSIS — D6959 Other secondary thrombocytopenia: Secondary | ICD-10-CM | POA: Diagnosis present

## 2015-09-22 DIAGNOSIS — F1010 Alcohol abuse, uncomplicated: Secondary | ICD-10-CM | POA: Diagnosis not present

## 2015-09-22 DIAGNOSIS — E876 Hypokalemia: Secondary | ICD-10-CM | POA: Diagnosis present

## 2015-09-22 DIAGNOSIS — R112 Nausea with vomiting, unspecified: Secondary | ICD-10-CM

## 2015-09-22 DIAGNOSIS — R509 Fever, unspecified: Secondary | ICD-10-CM | POA: Diagnosis not present

## 2015-09-22 DIAGNOSIS — J189 Pneumonia, unspecified organism: Secondary | ICD-10-CM | POA: Insufficient documentation

## 2015-09-22 LAB — CBC WITH DIFFERENTIAL/PLATELET
Basophils Absolute: 0 10*3/uL (ref 0.0–0.1)
Basophils Relative: 0 %
Eosinophils Absolute: 0 10*3/uL (ref 0.0–0.7)
Eosinophils Relative: 0 %
HCT: 32.9 % — ABNORMAL LOW (ref 39.0–52.0)
Hemoglobin: 11.3 g/dL — ABNORMAL LOW (ref 13.0–17.0)
Lymphocytes Relative: 7 %
Lymphs Abs: 0.5 10*3/uL — ABNORMAL LOW (ref 0.7–4.0)
MCH: 35.1 pg — ABNORMAL HIGH (ref 26.0–34.0)
MCHC: 34.3 g/dL (ref 30.0–36.0)
MCV: 102.2 fL — ABNORMAL HIGH (ref 78.0–100.0)
Monocytes Absolute: 0 10*3/uL — ABNORMAL LOW (ref 0.1–1.0)
Monocytes Relative: 0 %
Neutro Abs: 6.2 10*3/uL (ref 1.7–7.7)
Neutrophils Relative %: 93 %
Platelets: 146 10*3/uL — ABNORMAL LOW (ref 150–400)
RBC: 3.22 MIL/uL — ABNORMAL LOW (ref 4.22–5.81)
RDW: 17.1 % — ABNORMAL HIGH (ref 11.5–15.5)
WBC: 6.7 10*3/uL (ref 4.0–10.5)

## 2015-09-22 LAB — COMPREHENSIVE METABOLIC PANEL
ALT: 17 U/L (ref 17–63)
AST: 42 U/L — ABNORMAL HIGH (ref 15–41)
Albumin: 2.7 g/dL — ABNORMAL LOW (ref 3.5–5.0)
Alkaline Phosphatase: 90 U/L (ref 38–126)
Anion gap: 14 (ref 5–15)
BUN: 26 mg/dL — ABNORMAL HIGH (ref 6–20)
CO2: 25 mmol/L (ref 22–32)
Calcium: 7.8 mg/dL — ABNORMAL LOW (ref 8.9–10.3)
Chloride: 101 mmol/L (ref 101–111)
Creatinine, Ser: 0.98 mg/dL (ref 0.61–1.24)
GFR calc Af Amer: >60 mL/min (ref >60)
GFR calc non Af Amer: >60 mL/min (ref >60)
Glucose, Bld: 113 mg/dL — ABNORMAL HIGH (ref 65–99)
Potassium: 2.7 mmol/L — CL (ref 3.5–5.1)
Sodium: 140 mmol/L (ref 135–145)
Total Bilirubin: 6 mg/dL — ABNORMAL HIGH (ref 0.3–1.2)
Total Protein: 7.2 g/dL (ref 6.5–8.1)

## 2015-09-22 LAB — I-STAT CG4 LACTIC ACID, ED: Lactic Acid, Venous: 2.84 mmol/L (ref 0.5–2.0)

## 2015-09-22 LAB — URINE MICROSCOPIC-ADD ON

## 2015-09-22 LAB — URINALYSIS, ROUTINE W REFLEX MICROSCOPIC
Glucose, UA: NEGATIVE mg/dL
Ketones, ur: NEGATIVE mg/dL
Nitrite: POSITIVE — AB
Protein, ur: 30 mg/dL — AB
Specific Gravity, Urine: 1.022 (ref 1.005–1.030)
pH: 5.5 (ref 5.0–8.0)

## 2015-09-22 LAB — MAGNESIUM: Magnesium: 1.5 mg/dL — ABNORMAL LOW (ref 1.7–2.4)

## 2015-09-22 LAB — LIPASE, BLOOD: Lipase: 23 U/L (ref 11–51)

## 2015-09-22 LAB — INFLUENZA PANEL BY PCR (TYPE A & B)
H1N1 flu by pcr: NOT DETECTED
INFLAPCR: NEGATIVE
INFLBPCR: NEGATIVE

## 2015-09-22 LAB — ETHANOL: Alcohol, Ethyl (B): <5 mg/dL (ref <5)

## 2015-09-22 LAB — MRSA PCR SCREENING: MRSA BY PCR: NEGATIVE

## 2015-09-22 LAB — STREP PNEUMONIAE URINARY ANTIGEN: STREP PNEUMO URINARY ANTIGEN: POSITIVE — AB

## 2015-09-22 MED ORDER — POTASSIUM CHLORIDE IN NACL 40-0.9 MEQ/L-% IV SOLN
INTRAVENOUS | Status: DC
  Administered 2015-09-22: 125 mL/h via INTRAVENOUS
  Administered 2015-09-26 – 2015-09-29 (×2): 10 mL/h via INTRAVENOUS
  Filled 2015-09-22 (×9): qty 1000

## 2015-09-22 MED ORDER — POTASSIUM CHLORIDE 20 MEQ/15ML (10%) PO SOLN
40.0000 meq | Freq: Two times a day (BID) | ORAL | Status: DC
  Administered 2015-09-22: 40 meq via ORAL
  Filled 2015-09-22: qty 30

## 2015-09-22 MED ORDER — ENOXAPARIN SODIUM 40 MG/0.4ML ~~LOC~~ SOLN
40.0000 mg | SUBCUTANEOUS | Status: DC
  Administered 2015-09-22 – 2015-09-29 (×4): 40 mg via SUBCUTANEOUS
  Filled 2015-09-22 (×8): qty 0.4

## 2015-09-22 MED ORDER — POTASSIUM CHLORIDE CRYS ER 20 MEQ PO TBCR
40.0000 meq | EXTENDED_RELEASE_TABLET | Freq: Once | ORAL | Status: DC
  Filled 2015-09-22: qty 2

## 2015-09-22 MED ORDER — DEXTROMETHORPHAN POLISTIREX ER 30 MG/5ML PO SUER
30.0000 mg | Freq: Two times a day (BID) | ORAL | Status: DC
  Administered 2015-09-22 – 2015-09-30 (×16): 30 mg via ORAL
  Filled 2015-09-22 (×21): qty 5

## 2015-09-22 MED ORDER — MAGNESIUM SULFATE 2 GM/50ML IV SOLN
2.0000 g | INTRAVENOUS | Status: AC
  Administered 2015-09-22: 2 g via INTRAVENOUS
  Filled 2015-09-22: qty 50

## 2015-09-22 MED ORDER — POTASSIUM CHLORIDE CRYS ER 20 MEQ PO TBCR
40.0000 meq | EXTENDED_RELEASE_TABLET | Freq: Two times a day (BID) | ORAL | Status: DC
  Filled 2015-09-22: qty 2

## 2015-09-22 MED ORDER — ADULT MULTIVITAMIN W/MINERALS CH
1.0000 | ORAL_TABLET | Freq: Every day | ORAL | Status: DC
  Administered 2015-09-23 – 2015-09-30 (×6): 1 via ORAL
  Filled 2015-09-22 (×8): qty 1

## 2015-09-22 MED ORDER — VITAMIN B-1 100 MG PO TABS
100.0000 mg | ORAL_TABLET | Freq: Every day | ORAL | Status: DC
  Administered 2015-09-23 – 2015-09-30 (×7): 100 mg via ORAL
  Filled 2015-09-22 (×8): qty 1

## 2015-09-22 MED ORDER — ONDANSETRON HCL 4 MG/2ML IJ SOLN
4.0000 mg | Freq: Once | INTRAMUSCULAR | Status: DC
  Filled 2015-09-22: qty 2

## 2015-09-22 MED ORDER — POTASSIUM CHLORIDE 10 MEQ/100ML IV SOLN
10.0000 meq | Freq: Once | INTRAVENOUS | Status: AC
  Administered 2015-09-22: 10 meq via INTRAVENOUS
  Filled 2015-09-22: qty 100

## 2015-09-22 MED ORDER — LORAZEPAM 2 MG/ML IJ SOLN
0.0000 mg | Freq: Two times a day (BID) | INTRAMUSCULAR | Status: DC
Start: 2015-09-24 — End: 2015-09-25

## 2015-09-22 MED ORDER — LORAZEPAM 2 MG/ML IJ SOLN: 0.0000 mg | Freq: Four times a day (QID) | INTRAMUSCULAR | Status: AC

## 2015-09-22 MED ORDER — IPRATROPIUM-ALBUTEROL 0.5-2.5 (3) MG/3ML IN SOLN
3.0000 mL | Freq: Four times a day (QID) | RESPIRATORY_TRACT | Status: DC
  Administered 2015-09-22: 3 mL via RESPIRATORY_TRACT
  Filled 2015-09-22: qty 3

## 2015-09-22 MED ORDER — SODIUM CHLORIDE 0.9 % IV BOLUS (SEPSIS)
1000.0000 mL | Freq: Once | INTRAVENOUS | Status: AC
  Administered 2015-09-22: 1000 mL via INTRAVENOUS

## 2015-09-22 MED ORDER — DEXTROSE 5 % IV SOLN
500.0000 mg | Freq: Once | INTRAVENOUS | Status: AC
  Administered 2015-09-22: 500 mg via INTRAVENOUS
  Filled 2015-09-22: qty 500

## 2015-09-22 MED ORDER — ONDANSETRON HCL 4 MG/2ML IJ SOLN: 4.0000 mg | Freq: Four times a day (QID) | INTRAMUSCULAR | Status: DC | PRN

## 2015-09-22 MED ORDER — DEXTROSE 5 % IV SOLN
1.0000 g | Freq: Once | INTRAVENOUS | Status: AC
  Administered 2015-09-22: 1 g via INTRAVENOUS
  Filled 2015-09-22: qty 10

## 2015-09-22 MED ORDER — POTASSIUM CHLORIDE 20 MEQ/15ML (10%) PO SOLN
40.0000 meq | Freq: Once | ORAL | Status: AC
  Administered 2015-09-22: 40 meq via ORAL
  Filled 2015-09-22: qty 30

## 2015-09-22 MED ORDER — IPRATROPIUM-ALBUTEROL 0.5-2.5 (3) MG/3ML IN SOLN
3.0000 mL | Freq: Two times a day (BID) | RESPIRATORY_TRACT | Status: DC
  Administered 2015-09-23 – 2015-09-26 (×6): 3 mL via RESPIRATORY_TRACT
  Filled 2015-09-22 (×7): qty 3

## 2015-09-22 MED ORDER — LORAZEPAM 2 MG/ML IJ SOLN: 1.0000 mg | Freq: Four times a day (QID) | INTRAMUSCULAR | Status: DC | PRN

## 2015-09-22 MED ORDER — LEVOFLOXACIN IN D5W 750 MG/150ML IV SOLN
750.0000 mg | INTRAVENOUS | Status: DC
  Administered 2015-09-23: 750 mg via INTRAVENOUS
  Filled 2015-09-22 (×2): qty 150

## 2015-09-22 MED ORDER — THIAMINE HCL 100 MG/ML IJ SOLN
100.0000 mg | Freq: Every day | INTRAMUSCULAR | Status: DC
  Administered 2015-09-22: 100 mg via INTRAVENOUS
  Filled 2015-09-22 (×2): qty 1
  Filled 2015-09-22: qty 2

## 2015-09-22 MED ORDER — LORAZEPAM 1 MG PO TABS: 1.0000 mg | ORAL_TABLET | Freq: Four times a day (QID) | ORAL | Status: DC | PRN

## 2015-09-22 MED ORDER — FOLIC ACID 1 MG PO TABS
1.0000 mg | ORAL_TABLET | Freq: Every day | ORAL | Status: DC
  Administered 2015-09-23 – 2015-09-30 (×7): 1 mg via ORAL
  Filled 2015-09-22 (×8): qty 1

## 2015-09-22 MED ORDER — FAMOTIDINE 20 MG PO TABS
20.0000 mg | ORAL_TABLET | Freq: Two times a day (BID) | ORAL | Status: DC
Start: 2015-09-22 — End: 2015-10-01
  Administered 2015-09-22 – 2015-09-30 (×16): 20 mg via ORAL
  Filled 2015-09-22 (×17): qty 1

## 2015-09-22 NOTE — ED Notes (Signed)
Pt placed on telemetry with continuous pulse ox.  Mask provided for transport.

## 2015-09-22 NOTE — ED Provider Notes (Signed)
Complains of cough for the past 2 weeks accompanied by diarrhea onset 4 days ago and vomiting onset 4 days ago. Chest x-ray reviewed by me in light of immunocompromise state, hypokalemia suggest admission for potassium  replacement, intravenous antibiotics  Orlie Dakin, MD 09/22/15 1333

## 2015-09-22 NOTE — H&P (Signed)
History and Physical:    Johnny Mcknight   [id number] DOB: 06-21-1956 DOA: 09/22/2015  Referring MD/provider: Orlie Dakin, MD PCP: No primary care provider on file.   Chief Complaint: Cough  History of Present Illness:   Johnny Mcknight is an 60 y.o. male with a PMH of asthma, HTN and alcoholism who presents with a chief complaint of a 5 day history of diarrhea, emesis and cough. Diarrhea and emesis began Thursday after 3 shots of vodka. No hematemesis. He has had 7 episodes of diarrhea and 5 episodes of vomiting since Thursday. Denies hematochezia, melena, NSAID use and recent antibiotics. He has not been able to eat since onset and nothing improves his symptoms. This has happened before while drinking. Endorses subjective fever, fatigue, weight loss, wheezing, occasional abdominal pain and weakness. Cough is mostly non-productive. He drinks up to a pint of Vodka daily. CXR shows airspace consolidation consistent with pneumonia throughout the axillary subsegment and posterior segment of the right upper lobe. His lactate is mildly elevated.  He is at high risk for DTs.  ROS:   Review of Systems  Constitutional: Positive for fever, chills and malaise/fatigue.  HENT: Positive for congestion. Negative for ear pain and sore throat.   Eyes: Negative.   Respiratory: Positive for cough and shortness of breath. Negative for hemoptysis and sputum production.   Cardiovascular: Negative.        Right sided pleuritic chest pain  Gastrointestinal: Positive for nausea, vomiting and diarrhea. Negative for heartburn, abdominal pain, blood in stool and melena.  Genitourinary: Positive for dysuria and hematuria.       2 day hx  Musculoskeletal: Positive for joint pain.  Skin: Negative.        Dry skin  Neurological: Positive for weakness. Negative for dizziness, seizures and headaches.  Endo/Heme/Allergies: Negative.      Past Medical History:   Past Medical History  Diagnosis Date  . ALCOHOLISM  01/29/2008  . ARM PAIN, RIGHT 05/02/2008  . ASTHMA 04/18/2007  . CERVICAL RADICULITIS 05/02/2008  . DRY SKIN 10/15/2007  . GERD 10/15/2007  . HYPERTENSION 04/18/2007  . NECK SPRAIN AND STRAIN 09/15/2009  . NOSE FRACTURE 09/15/2009  . PARESTHESIA 05/02/2008  . Rash and other nonspecific skin eruption 04/02/2008  . SYNCOPE 09/15/2009  . TOBACCO USE, QUIT 10/05/2009  . TREMOR 04/18/2007  . WEAKNESS 09/15/2009    Past Surgical History:   History reviewed. No pertinent past surgical history.  Social History:   Social History   Social History  . Marital Status: Married    Spouse Name: N/A  . Number of Children: N/A  . Years of Education: N/A   Occupational History  . Disabled.    Social History Main Topics  . Smoking status: Former Research scientist (life sciences)  . Smokeless tobacco: Never Used  . Alcohol Use: 0.0 oz/week     Comment: gin 1/2 pint beer, stopped in 2011; 1-2 beers/d in 2013, pint of liquor a day on1/3/16  . Drug Use: No  . Sexual Activity: Yes   Other Topics Concern  . Not on file   Social History Narrative   Lives with wife.      Family history:   Family History  Problem Relation Age of Onset  . Hypertension Other   . Heart disease Father 22    MI  . Hypertension Father   . Hypertension Mother   . Diabetes Mother   . Diabetes Maternal Aunt   . Stroke Maternal Aunt   . Diabetes  Maternal Grandmother     Allergies   Doxycycline; Omeprazole-sodium bicarbonate; and Propranolol  Current Medications:   Prior to Admission medications   Medication Sig Start Date End Date Taking? Authorizing Provider  baclofen (LIORESAL) 10 MG tablet Take 1 tablet (10 mg total) by mouth at bedtime. Patient not taking: Reported on 09/22/2015 06/09/14   Lorayne Marek, MD  cyclobenzaprine (FLEXERIL) 5 MG tablet Take 1 tablet (5 mg total) by mouth 2 (two) times daily as needed for muscle spasms. Patient not taking: Reported on 09/22/2015 04/29/13   Cassandria Anger, MD  fluticasone (FLONASE) 50  MCG/ACT nasal spray Place 2 sprays into both nostrils daily. Patient not taking: Reported on 09/22/2015 06/09/14   Lorayne Marek, MD  hydrochlorothiazide (HYDRODIURIL) 25 MG tablet Take 1 tablet (25 mg total) by mouth daily. Patient not taking: Reported on 09/22/2015 06/03/13   Cassandria Anger, MD  lisinopril-hydrochlorothiazide (PRINZIDE,ZESTORETIC) 10-12.5 MG per tablet Take 1 tablet by mouth daily. Patient not taking: Reported on 09/22/2015 06/09/14   Lorayne Marek, MD  traMADol (ULTRAM) 50 MG tablet Take 1 tablet (50 mg total) by mouth every 8 (eight) hours as needed for moderate pain. Patient not taking: Reported on 09/22/2015 06/09/14   Lorayne Marek, MD  Vitamin D, Ergocalciferol, (DRISDOL) 50000 UNITS CAPS capsule Take 1 capsule (50,000 Units total) by mouth every 7 (seven) days. Patient not taking: Reported on 09/22/2015 06/10/14   Lorayne Marek, MD    Physical Exam:   Filed Vitals:   09/22/15 0956 09/22/15 1000 09/22/15 1312 09/22/15 1615  BP: 107/73 107/73 118/75   Pulse: 111 110 116 124  Temp: 100.3 F (37.9 C)  99.3 F (37.4 C) 100.9 F (38.3 C)  TempSrc: Oral  Oral Oral  Resp: 18  18 24   Height: 6' (1.829 m)     SpO2: 99% 99% 99% 91%     Physical Exam: Blood pressure 118/75, pulse 124, temperature 100.9 F (38.3 C), temperature source Oral, resp. rate 24, height 6' (1.829 m), SpO2 91 %. Gen: Mild distress from paroxysms of cough. Head: Normocephalic, atraumatic. Eyes: PERRL, EOMI, sclerae nonicteric. Mouth: Oropharynx with posterior pharyngeal erythema and white coating to tongue. Neck: Supple, no thyromegaly, no lymphadenopathy, no jugular venous distention. Chest: Lungs diminished with scattered rales and rhonchi. CV: Heart sounds are tachy/reg. Abdomen: Soft, nontender, nondistended with normal active bowel sounds. Extremities: Extremities are without C/E/C. Skin: Warm and dry, very dry skin. Neuro: Alert and oriented times 3; Non-focal. Psych: Mood and affect  normal.   Data Review:    Labs: Basic Metabolic Panel:  Recent Labs Lab 09/22/15 1143 09/22/15 1411  NA 140  --   K 2.7*  --   CL 101  --   CO2 25  --   GLUCOSE 113*  --   BUN 26*  --   CREATININE 0.98  --   CALCIUM 7.8*  --   MG  --  1.5*   Liver Function Tests:  Recent Labs Lab 09/22/15 1143  AST 42*  ALT 17  ALKPHOS 90  BILITOT 6.0*  PROT 7.2  ALBUMIN 2.7*    Recent Labs Lab 09/22/15 1143  LIPASE 23   CBC:  Recent Labs Lab 09/22/15 1143  WBC 6.7  NEUTROABS 6.2  HGB 11.3*  HCT 32.9*  MCV 102.2*  PLT 146*    Radiographic Studies: Dg Chest 2 View  09/22/2015  CLINICAL DATA:  Shortness of breath and right-sided chest pain EXAM: CHEST  2 VIEW COMPARISON:  September 06, 2009 FINDINGS: There is airspace consolidation throughout the posterior segment of the right upper lobe. There is also airspace disease in the axillary subsegment of the right upper lobe. Lungs elsewhere clear. Heart size and pulmonary vascularity are normal. No adenopathy. No bone lesions. IMPRESSION: Airspace consolidation consistent with pneumonia throughout the axillary subsegment and posterior segment of the right upper lobe. Lungs elsewhere clear. Followup PA and lateral chest radiographs recommended in 3-4 weeks following trial of antibiotic therapy to ensure resolution and exclude underlying malignancy. Electronically Signed   By: Lowella Grip III M.D.   On: 09/22/2015 12:25   EKG: None done.   Assessment/Plan:   Principal Problem:   Aspiration pneumonia ([place]) / R/O early sepsis / lactic acidosis / asthma - Pneumonia findings noted on CXR, and in the setting of N/V, suspect aspiration PNA. - R/O influenza, strep pneumonia, HIV. - Blood cultures, sputum culture requested. - Lactate elevated. - Admit to SDU given high risk for decompensation. - Duoneb Q 6 hours given h/o asthma. - Delsym for cough.  Active Problems:   GERD - Pepcid ordered.    Alcohol dependency  ([place]) - Detox per CIWA protocol, high risk for DTs. - Supplement thiamine/folic acid.    Nausea vomiting and diarrhea - Possibly from alcoholic gastritis. Lipase WNL. - Pepcid, anti-emetics ordered.    Hypokalemia/hypomagnesemia - Replete.    DVT prophylaxis - Lovenox ordered.  Code Status / Family Communication / Disposition Plan:   Code Status: Full. Family Communication: Wife, Noe Glod. Disposition Plan: Home when stable. Likely 3-4 days.  Attestation regarding necessity of inpatient status:   The appropriate admission status for this patient is INPATIENT. Inpatient status is judged to be reasonable and necessary in order to provide the required intensity of service to ensure the patient's safety. The patient's presenting symptoms, physical exam findings, and initial radiographic and laboratory data in the context of their chronic comorbidities is felt to place them at high risk for further clinical deterioration. Furthermore, it is not anticipated that the patient will be medically stable for discharge from the hospital within 2 midnights of admission. The following factors support the admission status of inpatient.   -The patient's presenting symptoms include cough, dyspnea, N/V/D. - The worrisome physical exam findings include tachypnea, tachycardia. - The initial radiographic and laboratory data are worrisome because of RUL pneumonia, hypokalemia. - The chronic co-morbidities include alcoholism/asthma. - Patient requires inpatient status due to high intensity of service, high risk for further deterioration and high frequency of surveillance required. - I certify that at the point of admission it is my clinical judgment that the patient will require inpatient hospital care spanning beyond 2 midnights from the point of admission.   Time spent: 1 hour.  RAMA,CHRISTINA [facility] Pager [phone] Cell: [phone]   If 7PM-7AM, please contact  night-coverage [email or web address] Password [email or web address] 09/22/2015, 4:25 PM

## 2015-09-22 NOTE — ED Notes (Signed)
Bed: WA06 Expected date:  Expected time:  Means of arrival:  Comments: EMS 

## 2015-09-22 NOTE — ED Notes (Signed)
Per EMS report: pt from home: pt is known to abuse ETOH and doesn't see his PCP often.  He is noncompliant with any medications.  Today, pt has been drinking heavily for the past 5 days and as a result has increased diarrhea.  Pt hx of diarrhea with heavy ETOH use. N/v on and off but no nausea at this time.  Hx diverticulitis. Pt a/o x 4 and ambulatory.

## 2015-09-22 NOTE — ED Notes (Signed)
Critical Potassium: 2.7  Notified primary nurse

## 2015-09-22 NOTE — ED Provider Notes (Signed)
CSN: [id number]     Arrival date & time 09/22/15  [id number] History   First MD Initiated Contact with Patient 09/22/15 1010     Chief Complaint  Patient presents with  . Diarrhea  . Emesis     (Consider location/radiation/quality/duration/timing/severity/associated sxs/prior Treatment) Patient is a 60 y.o. male presenting with diarrhea and vomiting.  Diarrhea Associated symptoms: abdominal pain and vomiting   Associated symptoms: no chills, no diaphoresis and no headaches   Emesis Associated symptoms: abdominal pain and diarrhea   Associated symptoms: no chills, no headaches and no sore throat     Patient presents to the emergency department with diarrhea, emesis and cough. Diarrhea and emesis began Thursday after 3 shots of vodka. . He has had 7 episodes of diarrhea and 5 episodes of vomiting since Thursday. Denies hematochezia, melena, hematemesis. NSAID use and recent antibiotics. He has not been able to eat since onset and nothing improves his symptoms. This has happened before while drinking. Endorses subjective fever, fatigue, weight loss, wheezing, occasional abdominal pain and weakness. Denies HA, dizziness, syncope, CP , palpitations, hemoptysis. Nonproductive cough has been going on for 2 weeks and is associated with sharp right upper chest pain. Patient also endorses peripheral edema, orthopnea and PND. Denies SOB while sitting upright. Patient drinks at least 1 pint of vodka a day and smokes a pipe regularly. Patient denies illicit drug use.   Past Medical History  Diagnosis Date  . ALCOHOLISM 01/29/2008  . ARM PAIN, RIGHT 05/02/2008  . ASTHMA 04/18/2007  . CERVICAL RADICULITIS 05/02/2008  . DRY SKIN 10/15/2007  . GERD 10/15/2007  . HYPERTENSION 04/18/2007  . NECK SPRAIN AND STRAIN 09/15/2009  . NOSE FRACTURE 09/15/2009  . PARESTHESIA 05/02/2008  . Rash and other nonspecific skin eruption 04/02/2008  . SYNCOPE 09/15/2009  . TOBACCO USE, QUIT 10/05/2009  . TREMOR 04/18/2007  . WEAKNESS  09/15/2009   History reviewed. No pertinent past surgical history. Family History  Problem Relation Age of Onset  . Hypertension Other   . Heart disease Father 63    MI  . Hypertension Father   . Hypertension Mother   . Diabetes Mother   . Diabetes Maternal Aunt   . Stroke Maternal Aunt   . Diabetes Maternal Grandmother    Social History  Substance Use Topics  . Smoking status: Former Research scientist (life sciences)  . Smokeless tobacco: Never Used  . Alcohol Use: 0.0 oz/week     Comment: gin 1/2 pint beer, stopped in 2011; 1-2 beers/d in 2013, pint of liquor a day on1/3/16    Review of Systems  Constitutional: Positive for activity change, appetite change, fatigue and unexpected weight change. Negative for chills and diaphoresis.  HENT: Positive for congestion. Negative for mouth sores, nosebleeds and sore throat.   Eyes: Negative for visual disturbance.  Respiratory: Positive for cough. Negative for chest tightness and shortness of breath.   Cardiovascular: Positive for leg swelling. Negative for chest pain and palpitations.  Gastrointestinal: Positive for nausea, vomiting, abdominal pain and diarrhea. Negative for constipation and abdominal distention.  Genitourinary: Negative for dysuria, urgency, hematuria and difficulty urinating.  Skin: Positive for pallor.  Neurological: Positive for weakness. Negative for dizziness, syncope, light-headedness and headaches.      Allergies  Doxycycline; Omeprazole-sodium bicarbonate; and Propranolol  Home Medications   Prior to Admission medications   Medication Sig Start Date End Date Taking? Authorizing Provider  baclofen (LIORESAL) 10 MG tablet Take 1 tablet (10 mg total) by mouth at bedtime. 06/09/14  Lorayne Marek, MD  cyclobenzaprine (FLEXERIL) 5 MG tablet Take 1 tablet (5 mg total) by mouth 2 (two) times daily as needed for muscle spasms. 04/29/13   Aleksei Plotnikov V, MD  fluticasone (FLONASE) 50 MCG/ACT nasal spray Place 2 sprays into both  nostrils daily. 06/09/14   Lorayne Marek, MD  hydrochlorothiazide (HYDRODIURIL) 25 MG tablet Take 1 tablet (25 mg total) by mouth daily. 06/03/13   Aleksei Plotnikov V, MD  HYDROcodone-acetaminophen ([place]) 7.5-325 MG per tablet Take 1 tablet by mouth 4 (four) times daily as needed for pain. 06/03/13   Aleksei Plotnikov V, MD  ibuprofen (ADVIL,MOTRIN) 600 MG tablet Take 1 tablet (600 mg total) by mouth every 8 (eight) hours as needed for pain. 06/03/13   Aleksei Plotnikov V, MD  lisinopril-hydrochlorothiazide (PRINZIDE,ZESTORETIC) 10-12.5 MG per tablet Take 1 tablet by mouth daily. 06/09/14   Lorayne Marek, MD  traMADol (ULTRAM) 50 MG tablet Take 1 tablet (50 mg total) by mouth every 8 (eight) hours as needed for moderate pain. 06/09/14   Lorayne Marek, MD  Vitamin D, Ergocalciferol, (DRISDOL) 50000 UNITS CAPS capsule Take 1 capsule (50,000 Units total) by mouth every 7 (seven) days. 06/10/14   Lorayne Marek, MD   BP 107/73 mmHg  Pulse 111  Temp(Src) 100.3 F (37.9 C) (Oral)  Resp 18  Ht 6' (1.829 m)  SpO2 99% Physical Exam  Constitutional: He is oriented to person, place, and time. He appears well-developed.  HENT:  Head: Normocephalic and atraumatic.  Mouth/Throat: Oropharynx is clear and moist. No oropharyngeal exudate.  Eyes: Pupils are equal, round, and reactive to light.  Neck: Normal range of motion. Neck supple.  Cardiovascular: Normal rate, regular rhythm, normal heart sounds and intact distal pulses.  Exam reveals no gallop and no friction rub.   No murmur heard. Pulmonary/Chest: Effort normal and breath sounds normal. No respiratory distress. He has no wheezes. He exhibits no tenderness.  Decreased breath sounds in LLL  Abdominal: Bowel sounds are normal. He exhibits no distension. There is tenderness. There is no rebound and no guarding.  Musculoskeletal: He exhibits no edema.  Lymphadenopathy:    He has no cervical adenopathy.  Neurological: He is alert and oriented to person,  place, and time. He exhibits normal muscle tone. Coordination normal.  Skin: Skin is warm and dry. No rash noted. He is not diaphoretic. No erythema.  Pallor of palms.Diffuse scaling on bilateral lower extremities.  Elongated nail on right fifth phalanx  Nursing note and vitals reviewed.   ED Course  Procedures (including critical care time) Labs Review Labs Reviewed  COMPREHENSIVE METABOLIC PANEL - Abnormal; Notable for the following:    Potassium 2.7 (*)    Glucose, Bld 113 (*)    BUN 26 (*)    Calcium 7.8 (*)    Albumin 2.7 (*)    AST 42 (*)    Total Bilirubin 6.0 (*)    All other components within normal limits  CBC WITH DIFFERENTIAL/PLATELET - Abnormal; Notable for the following:    RBC 3.22 (*)    Hemoglobin 11.3 (*)    HCT 32.9 (*)    MCV 102.2 (*)    MCH 35.1 (*)    RDW 17.1 (*)    Platelets 146 (*)    Lymphs Abs 0.5 (*)    Monocytes Absolute 0.0 (*)    All other components within normal limits  URINALYSIS, ROUTINE W REFLEX MICROSCOPIC (NOT AT [facility]) - Abnormal; Notable for the following:    Color, Urine  ORANGE (*)    APPearance CLOUDY (*)    Hgb urine dipstick TRACE (*)    Bilirubin Urine LARGE (*)    Protein, ur 30 (*)    Nitrite POSITIVE (*)    Leukocytes, UA SMALL (*)    All other components within normal limits  URINE MICROSCOPIC-ADD ON - Abnormal; Notable for the following:    Squamous Epithelial / LPF 0-5 (*)    Bacteria, UA MANY (*)    Casts GRANULAR CAST (*)    All other components within normal limits  I-STAT CG4 LACTIC ACID, ED - Abnormal; Notable for the following:    Lactic Acid, Venous 2.84 (*)    All other components within normal limits  CULTURE, BLOOD (ROUTINE X 2)  CULTURE, BLOOD (ROUTINE X 2)  ETHANOL  LIPASE, BLOOD  MAGNESIUM    Imaging Review Dg Chest 2 View  09/22/2015  CLINICAL DATA:  Shortness of breath and right-sided chest pain EXAM: CHEST  2 VIEW COMPARISON:  September 06, 2009 FINDINGS: There is airspace consolidation  throughout the posterior segment of the right upper lobe. There is also airspace disease in the axillary subsegment of the right upper lobe. Lungs elsewhere clear. Heart size and pulmonary vascularity are normal. No adenopathy. No bone lesions. IMPRESSION: Airspace consolidation consistent with pneumonia throughout the axillary subsegment and posterior segment of the right upper lobe. Lungs elsewhere clear. Followup PA and lateral chest radiographs recommended in 3-4 weeks following trial of antibiotic therapy to ensure resolution and exclude underlying malignancy. Electronically Signed   By: Lowella Grip III M.D.   On: 09/22/2015 12:25   I have personally reviewed and evaluated these images and lab results as part of my medical decision-making.  Patient be admitted to the hospital for further evaluation and care of his low potassium and community acquired pneumonia.  Dalia Heading, PA-C 09/22/15 1442  Orlie Dakin, MD 09/22/15 [phone]

## 2015-09-23 DIAGNOSIS — A403 Sepsis due to Streptococcus pneumoniae: Principal | ICD-10-CM

## 2015-09-23 DIAGNOSIS — A499 Bacterial infection, unspecified: Secondary | ICD-10-CM

## 2015-09-23 DIAGNOSIS — J9621 Acute and chronic respiratory failure with hypoxia: Secondary | ICD-10-CM

## 2015-09-23 LAB — URINE CULTURE: Culture: 2000

## 2015-09-23 LAB — BASIC METABOLIC PANEL
Anion gap: 9 (ref 5–15)
BUN: 18 mg/dL (ref 6–20)
CHLORIDE: 111 mmol/L (ref 101–111)
CO2: 22 mmol/L (ref 22–32)
Calcium: 7.4 mg/dL — ABNORMAL LOW (ref 8.9–10.3)
Creatinine, Ser: 0.65 mg/dL (ref 0.61–1.24)
GFR calc Af Amer: >60 mL/min (ref >60)
GFR calc non Af Amer: >60 mL/min (ref >60)
GLUCOSE: 98 mg/dL (ref 65–99)
POTASSIUM: 3.4 mmol/L — AB (ref 3.5–5.1)
Sodium: 142 mmol/L (ref 135–145)

## 2015-09-23 LAB — HIV ANTIBODY (ROUTINE TESTING W REFLEX): HIV SCREEN 4TH GENERATION: NONREACTIVE

## 2015-09-23 LAB — CBC
HEMATOCRIT: 30.1 % — AB (ref 39.0–52.0)
Hemoglobin: 10.4 g/dL — ABNORMAL LOW (ref 13.0–17.0)
MCH: 35.1 pg — ABNORMAL HIGH (ref 26.0–34.0)
MCHC: 34.6 g/dL (ref 30.0–36.0)
MCV: 101.7 fL — AB (ref 78.0–100.0)
Platelets: 148 10*3/uL — ABNORMAL LOW (ref 150–400)
RBC: 2.96 MIL/uL — ABNORMAL LOW (ref 4.22–5.81)
RDW: 17.2 % — AB (ref 11.5–15.5)
WBC: 4.9 10*3/uL (ref 4.0–10.5)

## 2015-09-23 MED ORDER — POTASSIUM CHLORIDE 20 MEQ/15ML (10%) PO SOLN
40.0000 meq | Freq: Once | ORAL | Status: AC
  Administered 2015-09-23: 40 meq via ORAL
  Filled 2015-09-23: qty 30

## 2015-09-23 MED ORDER — BACLOFEN 5 MG HALF TABLET
5.0000 mg | ORAL_TABLET | Freq: Three times a day (TID) | ORAL | Status: DC | PRN
  Administered 2015-09-23 (×2): 5 mg via ORAL
  Filled 2015-09-23 (×3): qty 1

## 2015-09-23 MED ORDER — MAGNESIUM SULFATE 2 GM/50ML IV SOLN
2.0000 g | Freq: Once | INTRAVENOUS | Status: AC
  Administered 2015-09-23: 2 g via INTRAVENOUS
  Filled 2015-09-23: qty 50

## 2015-09-23 NOTE — Progress Notes (Addendum)
Patient ID: Johnny Mcknight, male   DOB: 29-Oct-1955, 60 y.o.   MRN: [id number] [facility] PROGRESS NOTE  Johnny Mcknight [id number] DOB: 10-05-1955 DOA: 09/22/2015 PCP: No primary care provider on file. will set up with community [place] [facility]  Brief narrative:    61 y.o. male with past medical history of asthma, hypertension, history of alcohol abuse who presented to [facility] long hospital with reports of ongoing nausea, vomiting, diarrhea for past 5 days prior to this admission. He has been drinking vodka on daily basis. No reports of blood in the stool or hematemesis. Patient also reported subjective fever and wheezing.  On admission chest x-ray demonstrated airspace consolidation consistent with pneumonia in the right lung. He was given azithromycin and Rocephin in ED and then was started on Levaquin to cover for possible aspiration.   Assessment/Plan:    Principal Problem: Acute respiratory failure with hypoxemia / aspiration pneumonia right upper lung lobe / sepsis secondary to Streptococcus pneumonia / gram-positive bacteremia - Sepsis criteria met on the admission with tachycardia, tachypnea, fever, lactic acidosis. Source of infection thought to be pneumonia as seen on chest x-ray. Chest x-ray on the admission showed airspace consolidation in the right lung. Now we note that strep pneumonia is positive so sepsis is secondary to Streptococcus pneumonia. - Patient was given azithromycin and Rocephin in ED but then started Levaquin to cover for possible aspiration pneumonia - Blood cultures obtained on the admission show preliminary of result with gram-positive cocci in chains. We'll follow up on final report - Influenza is negative; HIV is pending - Patient is hemodynamically stable and respiratory status is stable - We will continue DuoNeb nebulizer twice daily - Transfer to telemetry floor today  Active Problems: History of alcohol abuse / alcohol withdrawal without  complications - Alcohol level was within normal limits at the time of the admission - Continue CIWA protocol - Continue IV fluids for next 24 hours.  GERD - Continue Pepcid  Hypokalemia/hypomagnesemia - Secondary to alcohol abuse, dietary depletion - Supplemented  Anemia of chronic disease / thrombocytopenia - Secondary to bone marrow suppression from alcohol abuse - Hemoglobin is stable at 10.4, platelets are 148 - If blood counts drop then stop Lovenox    DVT Prophylaxis  - Lovenox subcutaneous ordered   Code Status: Full.  Family Communication:  plan of care discussed with the patient Disposition Plan: transfer to telemetry today   IV access:  Peripheral IV  Procedures and diagnostic studies:    Dg Chest 2 View 09/22/2015  Airspace consolidation consistent with pneumonia throughout the axillary subsegment and posterior segment of the right upper lobe. Lungs elsewhere clear. Followup PA and lateral chest radiographs recommended in 3-4 weeks following trial of antibiotic therapy to ensure resolution and exclude underlying malignancy.   Medical Consultants:  None   Other Consultants:  Physical therapy   IAnti-Infectives:   Levaquin 09/23/2015 --> Azithromycin and Rocephin on admission 1 dose  Leisa Lenz, MD  [facility] Pager [phone]  Time spent in minutes: 25 minutes  If 7PM-7AM, please contact night-coverage [email or web address] Password [email or web address] 09/23/2015, 10:48 AM   LOS: 1 day    HPI/Subjective: No acute overnight events. Patient reports he feels better.   Objective: Filed Vitals:   09/23/15 0600 09/23/15 0703 09/23/15 0800 09/23/15 1026  BP: 147/79  141/86   Pulse:    110  Temp:  98.6 F (37 C)    TempSrc:      Resp: 29  27 20  Height:      Weight:      SpO2: 94%  96% 98%    Intake/Output Summary (Last 24 hours) at 09/23/15 1048 Last data filed at 09/23/15 0800  Gross per 24 hour  Intake   2650 ml  Output   1050 ml  Net   1600 ml     Exam:   General:  Pt is alert, not in acute distress  Cardiovascular: tachycardic, S1/S2, no murmurs  Respiratory: Clear to auscultation bilaterally, no wheezing, no crackles, no rhonchi  Abdomen: Soft, non tender, non distended, bowel sounds present  Extremities: No edema, pulses DP and PT palpable bilaterally  Neuro: Grossly nonfocal  Data Reviewed: Basic Metabolic Panel:  Recent Labs Lab 09/22/15 1143 09/22/15 1411 09/23/15 0344  NA 140  --  142  K 2.7*  --  3.4*  CL 101  --  111  CO2 25  --  22  GLUCOSE 113*  --  98  BUN 26*  --  18  CREATININE 0.98  --  0.65  CALCIUM 7.8*  --  7.4*  MG  --  1.5*  --    Liver Function Tests:  Recent Labs Lab 09/22/15 1143  AST 42*  ALT 17  ALKPHOS 90  BILITOT 6.0*  PROT 7.2  ALBUMIN 2.7*    Recent Labs Lab 09/22/15 1143  LIPASE 23   No results for input(s): AMMONIA in the last 168 hours. CBC:  Recent Labs Lab 09/22/15 1143 09/23/15 0344  WBC 6.7 4.9  NEUTROABS 6.2  --   HGB 11.3* 10.4*  HCT 32.9* 30.1*  MCV 102.2* 101.7*  PLT 146* 148*   Cardiac Enzymes: No results for input(s): CKTOTAL, CKMB, CKMBINDEX, TROPONINI in the last 168 hours. BNP: Invalid input(s): POCBNP CBG: No results for input(s): GLUCAP in the last 168 hours.  Culture, blood (routine x 2)     Status: None (Preliminary result)   Collection Time: 09/22/15  1:42 PM  Result Value Ref Range Status   Specimen Description BLOOD BLOOD RIGHT FOREARM  Final   Special Requests BOTTLES DRAWN AEROBIC AND ANAEROBIC 5CC EACH  Final   Culture  Setup Time   Final    GRAM POSITIVE COCCI IN CHAINS IN BOTH AEROBIC AND ANAEROBIC BOTTLES    Culture PENDING  Incomplete   Report Status PENDING  Incomplete  MRSA PCR Screening     Status: None   Collection Time: 09/22/15  4:36 PM  Result Value Ref Range Status   MRSA by PCR NEGATIVE NEGATIVE Final  Urine culture     Status: None (Preliminary result)   Collection Time: 09/22/15  6:23 PM  Result  Value Ref Range Status   Specimen Description URINE, RANDOM  Final   Special Requests Immunocompromised  Final   Culture   Final    NO GROWTH < 24 HOURS Performed at [facility]    Report Status PENDING  Incomplete     Scheduled Meds: . dextromethorphan  30 mg Oral BID  . enoxaparin (LOVENOX) injection  40 mg Subcutaneous Q24H  . famotidine  20 mg Oral BID  . folic acid  1 mg Oral Daily  . ipratropium-albuterol  3 mL Nebulization BID  . levofloxacin (LEVAQUIN) IV  750 mg Intravenous Q24H  . LORazepam  0-4 mg Intravenous Q6H   Followed by  . [START ON 09/24/2015] LORazepam  0-4 mg Intravenous Q12H  . multivitamin with minerals  1 tablet Oral Daily  . ondansetron (ZOFRAN) IV  4 mg Intravenous Once  . thiamine  100 mg Oral Daily   Or  . thiamine  100 mg Intravenous Daily   Continuous Infusions: . 0.9 % NaCl with KCl 40 mEq / L 75 mL/hr (09/23/15 0830)

## 2015-09-23 NOTE — Care Management Note (Signed)
Case Management Note  Patient Details  Name: Johnny Mcknight MRN: [id number] Date of Birth: [birth year]  Subjective/Objective:        Confirmed pna            Action/Plan:  Date: September 23, 2015 Chart reviewed for concurrent status and case management needs. Will continue to follow patient for changes and needs: Velva Harman, RN, BSN, [place]   [phone]  Expected Discharge Date:   (UNKNOWN)               Expected Discharge Plan:  Home/Self Care  In-House Referral:  NA  Discharge planning Services  CM Consult  Post Acute Care Choice:    Choice offered to:     DME Arranged:    DME Agency:     HH Arranged:    HH Agency:     Status of Service:  In process, will continue to follow  Medicare Important Message Given:    Date Medicare IM Given:    Medicare IM give by:    Date Additional Medicare IM Given:    Additional Medicare Important Message give by:     If discussed at [place] of Stay Meetings, dates discussed:    Additional Comments:  Leeroy Cha, RN 09/23/2015, 12:56 PM

## 2015-09-24 MED ORDER — CHLORPROMAZINE HCL 10 MG PO TABS
10.0000 mg | ORAL_TABLET | Freq: Three times a day (TID) | ORAL | Status: DC
  Administered 2015-09-24 – 2015-09-25 (×4): 10 mg via ORAL
  Filled 2015-09-24 (×5): qty 1

## 2015-09-24 MED ORDER — ACETAMINOPHEN 325 MG PO TABS
650.0000 mg | ORAL_TABLET | Freq: Four times a day (QID) | ORAL | Status: DC | PRN
  Administered 2015-09-24 – 2015-09-25 (×2): 650 mg via ORAL
  Filled 2015-09-24 (×2): qty 2

## 2015-09-24 MED ORDER — LEVOFLOXACIN 750 MG PO TABS
750.0000 mg | ORAL_TABLET | Freq: Every day | ORAL | Status: DC
  Administered 2015-09-24 – 2015-09-25 (×2): 750 mg via ORAL
  Filled 2015-09-24 (×2): qty 1

## 2015-09-24 NOTE — Evaluation (Signed)
Physical Therapy Evaluation Patient Details Name: Johnny Mcknight MRN: [id number] DOB: Mar 21, 1956 Today's Date: 09/24/2015   History of Present Illness  60 yo male admitted with aspiration pna, acute resp failure, sepsis, diarrhea. Hx of ETOH abuse, non compliance, asthma, HTN.   Clinical Impression  On eval, pt was Min guard assist for mobility-walked ~135 feet while using IV pole for support. Pt is unsteady at times. Limited distance due to pt c/o back, bil knee pain (chronic). LOB x1 during session. Encouraged pt to continue mobilizing/ambulating with supervision of nursing. Recommend HHPT follow up for general strength and balance training.     Follow Up Recommendations Home health PT;Supervision for mobility/OOB    Equipment Recommendations  None recommended by PT    Recommendations for Other Services       Precautions / Restrictions Precautions Precautions: Fall Restrictions Weight Bearing Restrictions: No      Mobility  Bed Mobility Overal bed mobility: Modified Independent Bed Mobility: Supine to Sit;Sit to Supine              Transfers Overall transfer level: Needs assistance   Transfers: Sit to/from Stand Sit to Stand: Supervision         General transfer comment: for safety. LOB x1 while standing at EOB (pt reaching over to tidy up)  Ambulation/Gait Ambulation/Gait assistance: Min guard Ambulation Distance (Feet): 135 Feet Assistive device:  (IV pole) Gait Pattern/deviations: Step-through pattern;Decreased stride length     General Gait Details: close guard for safety. unsteady at times. Used IV pole for support. Pt reports he uses cane sometimes. Tolerated distance well.   Stairs            Wheelchair Mobility    Modified Rankin (Stroke Patients Only)       Balance Overall balance assessment: Needs assistance         Standing balance support: During functional activity Standing balance-Leahy Scale: Fair Standing balance comment: LOB  x1                             Pertinent Vitals/Pain Pain Assessment: 0-10 Pain Score: 5  Pain Location: knees, back Pain Intervention(s): Monitored during session    [place] expects to be discharged to:: Private residence Living Arrangements: Spouse/significant other   Type of Home: House Home Access: Stairs to enter Entrance Stairs-Rails: Right Entrance Stairs-Number of Steps: 7 Home Layout: Two level;Bed/bath upstairs Home Equipment: Cane - single point Additional Comments: borrowed cane    Prior Function Level of Independence: Independent with assistive device(s)               Hand Dominance        Extremity/Trunk Assessment   Upper Extremity Assessment: Overall WFL for tasks assessed           Lower Extremity Assessment: Generalized weakness      Cervical / Trunk Assessment: Normal  Communication   Communication: No difficulties  Cognition Arousal/Alertness: Awake/alert Behavior During Therapy: WFL for tasks assessed/performed Overall Cognitive Status: Within Functional Limits for tasks assessed                      General Comments      Exercises        Assessment/Plan    PT Assessment Patient needs continued PT services  PT Diagnosis Difficulty walking;Generalized weakness   PT Problem List Decreased balance;Decreased mobility  PT Treatment Interventions Gait training;Functional mobility training;Therapeutic activities;Patient/family  education;Balance training;Therapeutic exercise   PT Goals (Current goals can be found in the Care Plan section) Acute Rehab PT Goals Patient Stated Goal: home soon PT Goal Formulation: With patient Time For Goal Achievement: 10/08/15 Potential to Achieve Goals: Good    Frequency Min 3X/week   Barriers to discharge        Co-evaluation               End of Session Equipment Utilized During Treatment: Gait belt Activity Tolerance: Patient tolerated  treatment well Patient left: in bed;with call bell/phone within reach           Time: 1000-1016 PT Time Calculation (min) (ACUTE ONLY): 16 min   Charges:   PT Evaluation $PT Eval Low Complexity: 1 Procedure     PT G Codes:        Weston Anna, MPT Pager: [phone]

## 2015-09-24 NOTE — Care Management Note (Signed)
Case Management Note  Patient Details  Name: Johnny Mcknight MRN: [id number] Date of Birth: 1955-12-25  Subjective/Objective:      60 y/o m admitted w/Asp pna. From home. HHPT-[facility] chosen-TC [facility] rep aware of HHPT order.pcp-dr. Plotikov appt 10/02/15.              Action/Plan:d/c home w/HHPT   Expected Discharge Date:   (UNKNOWN)               Expected Discharge Plan:  [place]  In-House Referral:  NA  Discharge planning Services  CM Consult  Post Acute Care Choice:    Choice offered to:  Patient  DME Arranged:    DME Agency:     HH Arranged:  PT [place]:  [place]  Status of Service:  In process, will continue to follow  Medicare Important Message Given:    Date Medicare IM Given:    Medicare IM give by:    Date Additional Medicare IM Given:    Additional Medicare Important Message give by:     If discussed at [place] of Stay Meetings, dates discussed:    Additional Comments:  Dessa Phi, RN 09/24/2015, 2:56 PM

## 2015-09-24 NOTE — Progress Notes (Signed)
PHARMACIST - PHYSICIAN COMMUNICATION DR:   Charlies Silvers CONCERNING: Antibiotic IV to Oral Route Change Policy  RECOMMENDATION: This patient is receiving Levaquin by the intravenous route.  Based on criteria approved by the Pharmacy and Therapeutics Committee, the antibiotic(s) is/are being converted to the equivalent oral dose form(s).   DESCRIPTION: These criteria include:  Patient being treated for a respiratory tract infection, urinary tract infection, cellulitis or clostridium difficile associated diarrhea if on metronidazole  The patient is not neutropenic and does not exhibit a GI malabsorption state  The patient is eating (either orally or via tube) and/or has been taking other orally administered medications for a least 24 hours  The patient is improving clinically and has a Tmax < 100.5  If you have questions about this conversion, please contact the Pharmacy Department  []   [phone] )  [facility] []   [phone] )  [facility] []   [phone] )  [facility] []   [phone] )  [facility] [x]   [phone] )  [place], PharmD, BCPS Pager: [phone] 09/24/2015@10 :03 AM

## 2015-09-24 NOTE — Progress Notes (Signed)
Patient ID: Johnny Mcknight, male   DOB: March 13, 1956, 60 y.o.   MRN: [id number] [facility] PROGRESS NOTE  Johnny Mcknight [id number] DOB: March 17, 1956 DOA: 09/22/2015 PCP: No primary care provider on file. will set up with community [place] [facility]  Brief narrative:    60 y.o. male with past medical history of asthma, hypertension, history of alcohol abuse who presented to [facility] long hospital with reports of ongoing nausea, vomiting, diarrhea for past 5 days prior to this admission. He has been drinking vodka on daily basis. No reports of blood in the stool or hematemesis. Patient also reported subjective fever and wheezing.   On admission chest x-ray demonstrated airspace consolidation consistent with pneumonia in the right lung. He was given azithromycin and Rocephin in ED and then was started on Levaquin to cover for possible aspiration.   Hospital course complicated with finding of gram positive cocci. In addition, strep pneumonia antigen was positive on this admission.   Transferred to telemetry 09/23/2015.  Assessment/Plan:    Principal Problem: Acute respiratory failure with hypoxemia / aspiration pneumonia right upper lung lobe / sepsis secondary to Streptococcus pneumonia / gram-positive bacteremia - Sepsis criteria met on the admission with tachycardia, tachypnea, fever, lactic acidosis. Source of infection likely pneumonia in the right lung which was seen on chest x-ray.  - Strep pneumonia ag positive - As noted above patient was given azithromycin and Rocephin in ED but then started Levaquin to cover for possible aspiration pneumonia - Blood cultures obtained on the admission show gram-positive cocci in one of the blood cultures. Final report is pending. - Influenza is negative; HIV is negative.  - Continue DuoNeb nebulizer twice daily  Active Problems: History of alcohol abuse / alcohol withdrawal without complications - Alcohol level was within normal limits at the  time of the admission - Continue CIWA protocol - No reports of withdrawals - Continue multivitamin, folic acid and thiamine  GERD - Continue Pepcid  Hypokalemia / hypomagnesemia - Secondary to alcohol abuse, dietary depletion - Supplemented - Check magnesium and BNP tomorrow morning  Anemia of chronic disease / thrombocytopenia - Secondary to bone marrow suppression from alcohol abuse - Hemoglobin is stable at 10.4, platelets are 148 - Check CBC tomorrow morning  Hiccups - Baclofen did not help so we'll try Thorazine today   DVT Prophylaxis  - Lovenox subQ   Code Status: Full.  Family Communication:  plan of care discussed with the patient Disposition Plan: home 09/25/2015.  IV access:  Peripheral IV  Procedures and diagnostic studies:    Dg Chest 2 View 09/22/2015  Airspace consolidation consistent with pneumonia throughout the axillary subsegment and posterior segment of the right upper lobe. Lungs elsewhere clear. Followup PA and lateral chest radiographs recommended in 3-4 weeks following trial of antibiotic therapy to ensure resolution and exclude underlying malignancy.   Medical Consultants:  None   Other Consultants:  Physical therapy   IAnti-Infectives:   Levaquin 09/23/2015 --> Azithromycin and Rocephin on admission 1 dose   Leisa Lenz, MD  [facility] Pager [phone]  Time spent in minutes: 25 minutes  If 7PM-7AM, please contact night-coverage [email or web address] Password [email or web address] 09/24/2015, 1:29 PM   LOS: 2 days    HPI/Subjective: No acute overnight events. Patient reports hiccups.   Objective: Filed Vitals:   09/23/15 2004 09/23/15 2058 09/24/15 0606 09/24/15 1121  BP: 161/99  142/86   Pulse: 119  119   Temp: 98.5 F (36.9 C)  98 F (36.7 C)  TempSrc: Oral  Oral   Resp: 20  18   Height:      Weight:      SpO2: 95% 97% 94% 96%    Intake/Output Summary (Last 24 hours) at 09/24/15 1329 Last data filed at 09/24/15 0948  Gross per 24  hour  Intake   1560 ml  Output   1275 ml  Net    285 ml    Exam:   General:  Pt is alert, awake   Cardiovascular: RRR, (+)S1, S2   Respiratory: No wheezing, no crackles, no rhonchi  Abdomen: (+) BS, non tender   Extremities: No leg swelling, pulses palpable   Neuro: Nonfocal  Data Reviewed: Basic Metabolic Panel:  Recent Labs Lab 09/22/15 1143 09/22/15 1411 09/23/15 0344  NA 140  --  142  K 2.7*  --  3.4*  CL 101  --  111  CO2 25  --  22  GLUCOSE 113*  --  98  BUN 26*  --  18  CREATININE 0.98  --  0.65  CALCIUM 7.8*  --  7.4*  MG  --  1.5*  --    Liver Function Tests:  Recent Labs Lab 09/22/15 1143  AST 42*  ALT 17  ALKPHOS 90  BILITOT 6.0*  PROT 7.2  ALBUMIN 2.7*    Recent Labs Lab 09/22/15 1143  LIPASE 23   No results for input(s): AMMONIA in the last 168 hours. CBC:  Recent Labs Lab 09/22/15 1143 09/23/15 0344  WBC 6.7 4.9  NEUTROABS 6.2  --   HGB 11.3* 10.4*  HCT 32.9* 30.1*  MCV 102.2* 101.7*  PLT 146* 148*   Cardiac Enzymes: No results for input(s): CKTOTAL, CKMB, CKMBINDEX, TROPONINI in the last 168 hours. BNP: Invalid input(s): POCBNP CBG: No results for input(s): GLUCAP in the last 168 hours.  Results for orders placed or performed during the hospital encounter of 09/22/15  Culture, blood (routine x 2)     Status: None (Preliminary result)   Collection Time: 09/22/15  1:42 PM  Result Value Ref Range Status   Specimen Description BLOOD BLOOD RIGHT FOREARM  Final   Special Requests BOTTLES DRAWN AEROBIC AND ANAEROBIC 5CC EACH  Final   Culture  Setup Time   Final    GRAM POSITIVE COCCI IN CHAINS IN BOTH AEROBIC AND ANAEROBIC BOTTLES CRITICAL RESULT CALLED TO, READ BACK BY AND VERIFIED WITH: J SUGGS '@[id number]'  09/23/15 MKELLY    Culture   Final    GRAM POSITIVE COCCI IDENTIFICATION AND SUSCEPTIBILITIES TO FOLLOW Performed at [facility]    Report Status PENDING  Incomplete  Culture, blood (routine x 2)     Status:  None (Preliminary result)   Collection Time: 09/22/15  2:15 PM  Result Value Ref Range Status   Specimen Description BLOOD LEFT HAND  Final   Special Requests IN PEDIATRIC BOTTLE [place]  Final   Culture   Final    NO GROWTH 2 DAYS Performed at [facility]    Report Status PENDING  Incomplete  MRSA PCR Screening     Status: None   Collection Time: 09/22/15  4:36 PM  Result Value Ref Range Status   MRSA by PCR NEGATIVE NEGATIVE Final    Comment:        The GeneXpert MRSA Assay (FDA approved for NASAL specimens only), is one component of a comprehensive MRSA colonization surveillance program. It is not intended to diagnose MRSA infection nor to guide or monitor treatment  for MRSA infections.   Urine culture     Status: None   Collection Time: 09/22/15  6:23 PM  Result Value Ref Range Status   Specimen Description URINE, RANDOM  Final   Special Requests Immunocompromised  Final   Culture   Final    2,000 COLONIES/mL INSIGNIFICANT GROWTH Performed at [facility]    Report Status 09/23/2015 FINAL  Final     Scheduled Meds: . chlorproMAZINE  10 mg Oral TID  . dextromethorphan  30 mg Oral BID  . enoxaparin (LOVENOX) injection  40 mg Subcutaneous Q24H  . famotidine  20 mg Oral BID  . folic acid  1 mg Oral Daily  . ipratropium-albuterol  3 mL Nebulization BID  . levofloxacin  750 mg Oral Daily  . LORazepam  0-4 mg Intravenous Q6H   Followed by  . LORazepam  0-4 mg Intravenous Q12H  . multivitamin with minerals  1 tablet Oral Daily  . ondansetron (ZOFRAN) IV  4 mg Intravenous Once  . thiamine  100 mg Oral Daily   Or  . thiamine  100 mg Intravenous Daily   Continuous Infusions: . 0.9 % NaCl with KCl 40 mEq / L 50 mL/hr (09/23/15 1050)

## 2015-09-25 ENCOUNTER — Inpatient Hospital Stay (HOSPITAL_COMMUNITY): Payer: Medicare HMO

## 2015-09-25 DIAGNOSIS — D638 Anemia in other chronic diseases classified elsewhere: Secondary | ICD-10-CM

## 2015-09-25 DIAGNOSIS — J9601 Acute respiratory failure with hypoxia: Secondary | ICD-10-CM

## 2015-09-25 DIAGNOSIS — R7881 Bacteremia: Secondary | ICD-10-CM

## 2015-09-25 LAB — CULTURE, BLOOD (ROUTINE X 2)

## 2015-09-25 LAB — BASIC METABOLIC PANEL
ANION GAP: 9 (ref 5–15)
BUN: 13 mg/dL (ref 6–20)
CALCIUM: 8 mg/dL — AB (ref 8.9–10.3)
CO2: 22 mmol/L (ref 22–32)
CREATININE: 0.58 mg/dL — AB (ref 0.61–1.24)
Chloride: 107 mmol/L (ref 101–111)
Glucose, Bld: 95 mg/dL (ref 65–99)
Potassium: 3.5 mmol/L (ref 3.5–5.1)
Sodium: 138 mmol/L (ref 135–145)

## 2015-09-25 LAB — MAGNESIUM: Magnesium: 1.6 mg/dL — ABNORMAL LOW (ref 1.7–2.4)

## 2015-09-25 MED ORDER — MAGNESIUM SULFATE 2 GM/50ML IV SOLN
2.0000 g | Freq: Once | INTRAVENOUS | Status: AC
  Administered 2015-09-25: 2 g via INTRAVENOUS
  Filled 2015-09-25: qty 50

## 2015-09-25 MED ORDER — LEVOFLOXACIN IN D5W 750 MG/150ML IV SOLN
750.0000 mg | INTRAVENOUS | Status: DC
  Administered 2015-09-26 – 2015-09-27 (×2): 750 mg via INTRAVENOUS
  Filled 2015-09-25 (×2): qty 150

## 2015-09-25 MED ORDER — LORAZEPAM 2 MG/ML IJ SOLN: 1.0000 mg | Freq: Four times a day (QID) | INTRAMUSCULAR | Status: DC | PRN

## 2015-09-25 MED ORDER — CHLORPROMAZINE HCL 10 MG PO TABS
15.0000 mg | ORAL_TABLET | Freq: Three times a day (TID) | ORAL | Status: DC
  Administered 2015-09-25 – 2015-09-30 (×16): 15 mg via ORAL
  Filled 2015-09-25 (×21): qty 2

## 2015-09-25 NOTE — Progress Notes (Signed)
  Echocardiogram 2D Echocardiogram has been performed.  Diamond Nickel 09/25/2015, 3:55 PM

## 2015-09-25 NOTE — Progress Notes (Signed)
Patient ID: Johnny Mcknight, male   DOB: [birth year], 58 y.o.   MRN: [id number] [facility] PROGRESS NOTE  Johnny Mcknight [id number] DOB: [birth year] DOA: 09/22/2015 PCP: No primary care provider on file. will set up with [facility]  Brief narrative:    60 y.o. male with past medical history of asthma, hypertension, history of alcohol abuse who presented to [facility] long hospital with reports of ongoing nausea, vomiting, diarrhea for past 5 days prior to this admission. He has been drinking vodka on daily basis. No reports of blood in the stool or hematemesis. Patient also reported subjective fever and wheezing.   On admission chest x-ray demonstrated airspace consolidation consistent with pneumonia in the right lung. He was given azithromycin and Rocephin in ED and then was started on Levaquin to cover for possible aspiration.   Strep pneumonia antigen was positive on this admission.    Transferred to telemetry 09/23/2015.  Hospital course complicated with finding of Streptococcus pneumonia on one of the blood cultures collected on the admission 09/22/2015. Patient is already on Levaquin.  Assessment/Plan:    Principal Problem: Acute respiratory failure with hypoxemia / aspiration pneumonia right upper lung lobe / sepsis secondary to Streptococcus pneumonia / streptococcus bacteremia - Sepsis criteria met on the admission with tachycardia, tachypnea, fever, lactic acidosis. CXR on admission showed right upper lung lobe pneumonia. - Strep pneumonia antigen is positive. - Blood culture on admission grew strep pneumonia in 1 of the sets. Repeat blood cultures today. - Influenza is negative; HIV is negative.  - Continue DuoNeb nebulizer twice daily  Active Problems: History of alcohol abuse / alcohol withdrawal without complications - Alcohol level was within normal limits at the time of the admission - No withdrawals - Continue multivitamin, folic acid and  thiamine - We will use Ativan 1 mg every 6 hours as needed   GERD - We will continue Pepcid  Hypokalemia / hypomagnesemia - Secondary to alcohol abuse, dietary depletion - Supplemented and potassium WNL - Magnesium still low at 1.6 and supplemented today as well.  Anemia of chronic disease / thrombocytopenia - Secondary to bone marrow suppression from alcohol abuse - Hemoglobin is stable at 10.4, platelets are 148  Hiccups - Continue Thorazine   DVT Prophylaxis  - Lovenox subQ in hospital   Code Status: Full.  Family Communication:  plan of care discussed with the patient Disposition Plan: home once repeat blood culture results are back.  IV access:  Peripheral IV  Procedures and diagnostic studies:    Dg Chest 2 View 09/22/2015  Airspace consolidation consistent with pneumonia throughout the axillary subsegment and posterior segment of the right upper lobe. Lungs elsewhere clear. Followup PA and lateral chest radiographs recommended in 3-4 weeks following trial of antibiotic therapy to ensure resolution and exclude underlying malignancy.   Medical Consultants:  None   Other Consultants:  Physical therapy   IAnti-Infectives:   Levaquin 09/23/2015 --> Azithromycin and Rocephin on admission 1 dose   Johnny Lenz, MD  [facility] Pager [phone]  Time spent in minutes: 25 minutes  If 7PM-7AM, please contact night-coverage [email or web address] Password [email or web address] 09/25/2015, 12:44 PM   LOS: 3 days    HPI/Subjective: No acute overnight events. Patient reports hiccups are better.  Objective: Filed Vitals:   09/24/15 2142 09/25/15 0004 09/25/15 0458 09/25/15 0804  BP:   141/92   Pulse:   113   Temp: 101.8 F (38.8 C) 99.8 F (37.7 C) 100.3 F (37.9 C)  TempSrc: Oral Oral Oral   Resp:   22   Height:      Weight:      SpO2:   93% 94%    Intake/Output Summary (Last 24 hours) at 09/25/15 1244 Last data filed at 09/25/15 0850  Gross per 24 hour  Intake    864  ml  Output    401 ml  Net    463 ml    Exam:   General:  Pt is not in distress  Cardiovascular: Rate controlled, appreciate S1,S2   Respiratory: bilateral air entry, no wheezing   Abdomen: non tender, (+) BS  Extremities: No swelling, palpable pulses   Neuro: No focal deficits   Data Reviewed: Basic Metabolic Panel:  Recent Labs Lab 09/22/15 1143 09/22/15 1411 09/23/15 0344 09/25/15 0520  NA 140  --  142 138  K 2.7*  --  3.4* 3.5  CL 101  --  111 107  CO2 25  --  22 22  GLUCOSE 113*  --  98 95  BUN 26*  --  18 13  CREATININE 0.98  --  0.65 0.58*  CALCIUM 7.8*  --  7.4* 8.0*  MG  --  1.5*  --  1.6*   Liver Function Tests:  Recent Labs Lab 09/22/15 1143  AST 42*  ALT 17  ALKPHOS 90  BILITOT 6.0*  PROT 7.2  ALBUMIN 2.7*    Recent Labs Lab 09/22/15 1143  LIPASE 23   No results for input(s): AMMONIA in the last 168 hours. CBC:  Recent Labs Lab 09/22/15 1143 09/23/15 0344  WBC 6.7 4.9  NEUTROABS 6.2  --   HGB 11.3* 10.4*  HCT 32.9* 30.1*  MCV 102.2* 101.7*  PLT 146* 148*   Cardiac Enzymes: No results for input(s): CKTOTAL, CKMB, CKMBINDEX, TROPONINI in the last 168 hours. BNP: Invalid input(s): POCBNP CBG: No results for input(s): GLUCAP in the last 168 hours.  Culture, blood (routine x 2)     Status: None   Collection Time: 09/22/15  1:42 PM  Result Value Ref Range Status   Specimen Description BLOOD BLOOD RIGHT FOREARM  Final   Culture  Setup Time   Final   Culture   Final    STREPTOCOCCUS PNEUMONIAE Performed at [facility]    Report Status 09/25/2015 FINAL  Final   Organism ID, Bacteria STREPTOCOCCUS PNEUMONIAE  Final      Susceptibility   Streptococcus pneumoniae - MIC*    ERYTHROMYCIN <=0.12 SENSITIVE Sensitive     LEVOFLOXACIN 1 SENSITIVE Sensitive     VANCOMYCIN 0.25 SENSITIVE Sensitive     PENICILLIN <=0.06 SENSITIVE Sensitive     CEFTRIAXONE <=0.12 SENSITIVE Sensitive     * STREPTOCOCCUS PNEUMONIAE  Culture,  blood (routine x 2)     Status: None (Preliminary result)   Collection Time: 09/22/15  2:15 PM  Result Value Ref Range Status   Specimen Description BLOOD LEFT HAND  Final   Special Requests IN PEDIATRIC BOTTLE 2CC  Final   Culture   Final    NO GROWTH 2 DAYS    Report Status PENDING  Incomplete  MRSA PCR Screening     Status: None   Collection Time: 09/22/15  4:36 PM  Result Value Ref Range Status   MRSA by PCR NEGATIVE NEGATIVE Final  Urine culture     Status: None   Collection Time: 09/22/15  6:23 PM  Result Value Ref Range Status   Specimen Description URINE, RANDOM  Final  Special Requests Immunocompromised  Final   Culture   Final    2,000 COLONIES/mL INSIGNIFICANT GROWTH Performed at [facility]    Report Status 09/23/2015 FINAL  Final     Scheduled Meds: . chlorproMAZINE  15 mg Oral TID  . dextromethorphan  30 mg Oral BID  . enoxaparin (LOVENOX) injection  40 mg Subcutaneous Q24H  . famotidine  20 mg Oral BID  . folic acid  1 mg Oral Daily  . ipratropium-albuterol  3 mL Nebulization BID  . levofloxacin  750 mg Oral Daily  . multivitamin with minerals  1 tablet Oral Daily  . thiamine  100 mg Oral Daily   Continuous Infusions: . 0.9 % NaCl with KCl 40 mEq / L 10 mL/hr (09/24/15 1336)

## 2015-09-25 NOTE — Care Management Important Message (Signed)
Important Message  Patient Details  Name: Lari Zerby MRN: [id number] Date of Birth: [birth year]   Medicare Important Message Given:  Yes    [name] 09/25/2015, 11:48 AMImportant Message  Patient Details  Name: Derrick Mockler MRN: [id number] Date of Birth: [birth year]   Medicare Important Message Given:  Yes    [name] 09/25/2015, 11:48 AM

## 2015-09-26 LAB — BASIC METABOLIC PANEL
Anion gap: 8 (ref 5–15)
BUN: 10 mg/dL (ref 6–20)
CALCIUM: 7.8 mg/dL — AB (ref 8.9–10.3)
CHLORIDE: 108 mmol/L (ref 101–111)
CO2: 21 mmol/L — ABNORMAL LOW (ref 22–32)
CREATININE: 0.56 mg/dL — AB (ref 0.61–1.24)
GFR calc Af Amer: >60 mL/min (ref >60)
GLUCOSE: 89 mg/dL (ref 65–99)
POTASSIUM: 3.7 mmol/L (ref 3.5–5.1)
Sodium: 137 mmol/L (ref 135–145)

## 2015-09-26 LAB — MAGNESIUM: MAGNESIUM: 1.6 mg/dL — AB (ref 1.7–2.4)

## 2015-09-26 MED ORDER — MAGNESIUM SULFATE 2 GM/50ML IV SOLN
2.0000 g | Freq: Once | INTRAVENOUS | Status: AC
  Administered 2015-09-26: 2 g via INTRAVENOUS
  Filled 2015-09-26: qty 50

## 2015-09-26 MED ORDER — MAGNESIUM OXIDE 400 (241.3 MG) MG PO TABS
400.0000 mg | ORAL_TABLET | Freq: Every day | ORAL | Status: DC
  Administered 2015-09-26 – 2015-09-30 (×4): 400 mg via ORAL
  Filled 2015-09-26 (×4): qty 1

## 2015-09-26 NOTE — Progress Notes (Signed)
Pt was transferred from the 4th floor to 1322. Pt was transported by RN and NT accompanied by wife. V/S are stable. Will continue to monitor him closely.

## 2015-09-26 NOTE — Progress Notes (Addendum)
Patient ID: Johnny Mcknight, male   DOB: 20-Aug-1956, 60 y.o.   MRN: [id number] [facility] PROGRESS NOTE  Johnny Mcknight [id number] DOB: Sep 17, 1956 DOA: 09/22/2015 PCP: No primary care provider on file. will set up with community [place] [facility]  Brief narrative:    60 y.o. male with past medical history of asthma, hypertension, history of alcohol abuse who presented to [facility] hospital with reports of ongoing nausea, vomiting, diarrhea for past 5 days prior to this admission. He has been drinking vodka on daily basis. No reports of blood in the stool or hematemesis. Patient also reported subjective fever and wheezing.   On admission chest x-ray demonstrated airspace consolidation consistent with pneumonia in the right lung. He was given azithromycin and Rocephin in ED and then was started on Levaquin to cover for possible aspiration.   Strep pneumonia antigen was positive on this admission.    Transferred to telemetry 09/23/2015.  Hospital course complicated with finding of Streptococcus pneumonia on one of the blood cultures collected on the admission 09/22/2015. Patient is on Levaquin.  Anticipated discharge: 09/27/2015  Assessment/Plan:    Principal Problem: Acute respiratory failure with hypoxemia / sepsis secondary to streptococcus pneumonia of the right upper lung lobe / streptococcus bacteremia - Sepsis criteria met on the admission with tachycardia, tachypnea, fever, lactic acidosis. CXR on admission showed right upper lung lobe pneumonia. - Strep pneumonia antigen is positive. - Blood culture on admission grew strep pneumonia in 1 of the sets. Repeat blood cultures so far show no growth. - Influenza is negative; HIV is negative.  - Continue DuoNeb nebulizer twice daily as needed  Active Problems: History of alcohol abuse / alcohol withdrawal without complications - Alcohol level was within normal limits at the time of the admission - No withdrawals - Continue  multivitamin, folic acid and thiamine - We will use Ativan 1 mg every 6 hours as needed   GERD - Continue Pepcid  Hypokalemia / hypomagnesemia - Secondary to alcohol abuse, dietary depletion - Supplemented and potassium WNL - Magnesium still low at 1.6. We will supplemented with magnesium sulfate IV 1 dose now and will start magnesium oxide supplementation 400 mg daily. - Recheck BMP and magnesium tomorrow morning  Anemia of chronic disease / thrombocytopenia - Secondary to bone marrow suppression from alcohol abuse - Hemoglobin is stable at 10.4, platelets are 148 - Check CBC tomorrow morning   Hiccups - Continue Thorazine, seems to be helping   DVT Prophylaxis  - Lovenox subQ   Code Status: Full.  Family Communication:  plan of care discussed with the patient Disposition Plan: home likely 09/27/2015.  IV access:  Peripheral IV  Procedures and diagnostic studies:    Dg Chest 2 View 09/22/2015  Airspace consolidation consistent with pneumonia throughout the axillary subsegment and posterior segment of the right upper lobe. Lungs elsewhere clear. Followup PA and lateral chest radiographs recommended in 3-4 weeks following trial of antibiotic therapy to ensure resolution and exclude underlying malignancy.   Medical Consultants:  None   Other Consultants:  Physical therapy   IAnti-Infectives:   Levaquin 09/23/2015 --> Azithromycin and Rocephin on admission 1 dose   Leisa Lenz, MD  [facility] Pager [phone]  Time spent in minutes: 25 minutes  If 7PM-7AM, please contact night-coverage [email or web address] Password [email or web address] 09/26/2015, 10:30 AM   LOS: 4 days    HPI/Subjective: No acute overnight events. Patient reports no respiratory distress.  Objective: Filed Vitals:   09/26/15 3154 09/26/15 0086 09/26/15 7619  09/26/15 0854  BP: 127/80 137/79    Pulse: 105 100    Temp: 97.7 F (36.5 C) 99.8 F (37.7 C)    TempSrc: Oral Oral    Resp: 20 20    Height:       Weight:      SpO2: 98% 96% 97% 97%    Intake/Output Summary (Last 24 hours) at 09/26/15 1030 Last data filed at 09/25/15 1900  Gross per 24 hour  Intake    120 ml  Output    250 ml  Net   -130 ml    Exam:   General:  Pt is alert, awake  Cardiovascular: RRR, (+) S1, S2  Respiratory: Clear to auscultation bilaterally, no rhonchi  Abdomen: Appreciate bowel sounds, nontender abdomen  Extremities: No lower extremity edema, pulses palpable  Neuro: Nonfocal  Data Reviewed: Basic Metabolic Panel:  Recent Labs Lab 09/22/15 1143 09/22/15 1411 09/23/15 0344 09/25/15 0520 09/26/15 0635  NA 140  --  142 138 137  K 2.7*  --  3.4* 3.5 3.7  CL 101  --  111 107 108  CO2 25  --  22 22 21*  GLUCOSE 113*  --  98 95 89  BUN 26*  --  '18 13 10  ' CREATININE 0.98  --  0.65 0.58* 0.56*  CALCIUM 7.8*  --  7.4* 8.0* 7.8*  MG  --  1.5*  --  1.6* 1.6*   Liver Function Tests:  Recent Labs Lab 09/22/15 1143  AST 42*  ALT 17  ALKPHOS 90  BILITOT 6.0*  PROT 7.2  ALBUMIN 2.7*    Recent Labs Lab 09/22/15 1143  LIPASE 23   No results for input(s): AMMONIA in the last 168 hours. CBC:  Recent Labs Lab 09/22/15 1143 09/23/15 0344  WBC 6.7 4.9  NEUTROABS 6.2  --   HGB 11.3* 10.4*  HCT 32.9* 30.1*  MCV 102.2* 101.7*  PLT 146* 148*   Cardiac Enzymes: No results for input(s): CKTOTAL, CKMB, CKMBINDEX, TROPONINI in the last 168 hours. BNP: Invalid input(s): POCBNP CBG: No results for input(s): GLUCAP in the last 168 hours. Results for orders placed or performed during the hospital encounter of 09/22/15  Culture, blood (routine x 2)     Status: None   Collection Time: 09/22/15  1:42 PM  Result Value Ref Range Status   Specimen Description BLOOD BLOOD RIGHT FOREARM  Final   Special Requests BOTTLES DRAWN AEROBIC AND ANAEROBIC 5CC EACH  Final   Culture  Setup Time   Final    GRAM POSITIVE COCCI IN CHAINS IN BOTH AEROBIC AND ANAEROBIC BOTTLES CRITICAL RESULT CALLED TO,  READ BACK BY AND VERIFIED WITH: J SUGGS '@[id number]'  09/23/15 MKELLY    Culture   Final    STREPTOCOCCUS PNEUMONIAE Performed at [facility]    Report Status 09/25/2015 FINAL  Final   Organism ID, Bacteria STREPTOCOCCUS PNEUMONIAE  Final      Susceptibility   Streptococcus pneumoniae - MIC*    ERYTHROMYCIN <=0.12 SENSITIVE Sensitive     LEVOFLOXACIN 1 SENSITIVE Sensitive     VANCOMYCIN 0.25 SENSITIVE Sensitive     PENICILLIN <=0.06 SENSITIVE Sensitive     CEFTRIAXONE <=0.12 SENSITIVE Sensitive     * STREPTOCOCCUS PNEUMONIAE  Culture, blood (routine x 2)     Status: None (Preliminary result)   Collection Time: 09/22/15  2:15 PM  Result Value Ref Range Status   Specimen Description BLOOD LEFT HAND  Final   Special Requests IN  PEDIATRIC BOTTLE 2CC  Final   Culture   Final    NO GROWTH 4 DAYS Performed at [facility]    Report Status PENDING  Incomplete  MRSA PCR Screening     Status: None   Collection Time: 09/22/15  4:36 PM  Result Value Ref Range Status   MRSA by PCR NEGATIVE NEGATIVE Final    Comment:        The GeneXpert MRSA Assay (FDA approved for NASAL specimens only), is one component of a comprehensive MRSA colonization surveillance program. It is not intended to diagnose MRSA infection nor to guide or monitor treatment for MRSA infections.   Urine culture     Status: None   Collection Time: 09/22/15  6:23 PM  Result Value Ref Range Status   Specimen Description URINE, RANDOM  Final   Special Requests Immunocompromised  Final   Culture   Final    2,000 COLONIES/mL INSIGNIFICANT GROWTH Performed at [facility]    Report Status 09/23/2015 FINAL  Final  Culture, blood (routine x 2)     Status: None (Preliminary result)   Collection Time: 09/25/15  1:15 PM  Result Value Ref Range Status   Specimen Description BLOOD LEFT ARM  Final   Special Requests BOTTLES DRAWN AEROBIC AND ANAEROBIC  10CC  Final   Culture   Final    NO GROWTH < 24  HOURS Performed at [facility]    Report Status PENDING  Incomplete  Culture, blood (routine x 2)     Status: None (Preliminary result)   Collection Time: 09/25/15  1:15 PM  Result Value Ref Range Status   Specimen Description BLOOD LEFT ARM  Final   Special Requests BOTTLES DRAWN AEROBIC AND ANAEROBIC  10CC  Final   Culture   Final    NO GROWTH < 24 HOURS Performed at  [facility]    Report Status PENDING  Incomplete    Scheduled Meds: . chlorproMAZINE  15 mg Oral TID  . dextromethorphan  30 mg Oral BID  . enoxaparin (LOVENOX) injection  40 mg Subcutaneous Q24H  . famotidine  20 mg Oral BID  . folic acid  1 mg Oral Daily  . levofloxacin (LEVAQUIN) IV  750 mg Intravenous Q24H  . multivitamin with minerals  1 tablet Oral Daily  . thiamine  100 mg Oral Daily   Continuous Infusions: . 0.9 % NaCl with KCl 40 mEq / L 10 mL/hr (09/24/15 1336)

## 2015-09-27 ENCOUNTER — Encounter (HOSPITAL_COMMUNITY): Payer: Self-pay | Admitting: Internal Medicine

## 2015-09-27 DIAGNOSIS — R7881 Bacteremia: Secondary | ICD-10-CM | POA: Diagnosis present

## 2015-09-27 DIAGNOSIS — F10288 Alcohol dependence with other alcohol-induced disorder: Secondary | ICD-10-CM | POA: Diagnosis present

## 2015-09-27 DIAGNOSIS — D696 Thrombocytopenia, unspecified: Secondary | ICD-10-CM | POA: Diagnosis present

## 2015-09-27 DIAGNOSIS — J154 Pneumonia due to other streptococci: Secondary | ICD-10-CM

## 2015-09-27 DIAGNOSIS — B953 Streptococcus pneumoniae as the cause of diseases classified elsewhere: Secondary | ICD-10-CM | POA: Diagnosis present

## 2015-09-27 DIAGNOSIS — J9601 Acute respiratory failure with hypoxia: Secondary | ICD-10-CM | POA: Diagnosis present

## 2015-09-27 DIAGNOSIS — D638 Anemia in other chronic diseases classified elsewhere: Secondary | ICD-10-CM | POA: Diagnosis present

## 2015-09-27 HISTORY — DX: Pneumonia due to other streptococci: J15.4

## 2015-09-27 LAB — MAGNESIUM: MAGNESIUM: 1.7 mg/dL (ref 1.7–2.4)

## 2015-09-27 LAB — CULTURE, BLOOD (ROUTINE X 2): Culture: NO GROWTH

## 2015-09-27 MED ORDER — CEFAZOLIN SODIUM-DEXTROSE 2-3 GM-% IV SOLR
2.0000 g | Freq: Three times a day (TID) | INTRAVENOUS | Status: DC
  Administered 2015-09-27 – 2015-09-30 (×9): 2 g via INTRAVENOUS
  Filled 2015-09-27 (×9): qty 50

## 2015-09-27 NOTE — Progress Notes (Addendum)
Patient ID: Johnny Mcknight, male   DOB: 09-02-1956, 60 y.o.   MRN: [id number] [facility] PROGRESS NOTE  Brix Brearley POE:[id number] DOB: 1956-05-31 DOA: 09/22/2015 PCP: No primary care provider on file. will set up with community [place] [facility]  Brief narrative:    60 y.o. male with past medical history of asthma, hypertension, history of alcohol abuse who presented to [facility] hospital with reports of ongoing nausea, vomiting, diarrhea for past 5 days prior to this admission. He has been drinking vodka on daily basis. Patient also reported subjective fever and wheezing.   On admission chest x-ray demonstrated airspace consolidation consistent with pneumonia in the right lung. He was given azithromycin and Rocephin in ED and then was started on Levaquin to cover for possible aspiration.   During this hospital stay patient was found to have positive streptococcal pneumonia antigen in addition to streptococcus pneumoniae in one of the blood cultures collected on the admission. He was on Levaquin but we changed that to Ancef per infectious disease recommendation 09/27/2015 when we got the report the 2-D echo has possible mobile mass on the mitral valve concerning for SBE. As part of the back to recurrent endocarditis workup cardiology will set up TEE for 09/28/2015.   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 CXR on admission showed right upper lung lobe pneumonia. - Strep pneumonia antigen was positive on the admission - In addition, one of the blood cultures on the admission grew Streptococcus pneumonia. Repeat blood cultures show no growth so far - Influenza is negative; HIV is negative.  - Off note, as part of the  streptococcus bacteremia we ordered 2-D echo for evaluation of possible vegetations, endocarditis. 2 D ECHO concernign for mitral valve possibel small mobile mass on atrial surface of anterior mitral leaflet suggest TEE if clinical suspicion for SBE - Consulted cardiology and appreciate their services setting up TEE for tomorrow  - Infectious disease will see the patient in consultation  - Stop Levaquin and use Ancef    Active Problems: History of alcohol abuse / alcohol withdrawal without complications - Alcohol level was within normal limits at the time of the admission - No reports of withdrawals - Continue multivitamin, folic acid and thiamine  GERD - Continue Pepcid  Hypokalemia / hypomagnesemia - Secondary to alcohol abuse, dietary depletion - Supplemented and potassium WNL - Started supplementation for magnesium, magnesium oxide 400 mg daily and magnesium is within normal limits this morning  Anemia of chronic disease / thrombocytopenia - Secondary to history of alcohol abuse - Hemoglobin is stable at 10.4, platelets are 148  Hiccups - Thorazine seems to be helping   DVT Prophylaxis  - Lovenox subQ in hospital  Code Status: Full.  Family Communication:  plan of care discussed with the patient and his wife at the bedside Disposition Plan: needs TEE tomorrow, anticipate D/C by 1/11or 1/12 depending on when the work up is completed   IV access:  Peripheral IV  Procedures and diagnostic studies:    Dg Chest 2 View 09/22/2015  Airspace consolidation consistent with pneumonia throughout the axillary subsegment and posterior segment of the right upper lobe. Lungs elsewhere clear. Followup PA and lateral chest radiographs recommended in 3-4 weeks following trial of antibiotic therapy to ensure resolution and exclude  underlying malignancy.   Medical Consultants:  Cardio ID  Other Consultants:  Physical therapy   IAnti-Infectives:   Levaquin 09/23/2015 --> Azithromycin and  Rocephin on admission 1 dose   Leisa Lenz, MD  [facility] Pager [phone]  Time spent in minutes: 25 minutes  If 7PM-7AM, please contact night-coverage [email or web address] Password [email or web address] 09/27/2015, 10:22 AM   LOS: 5 days    HPI/Subjective: No acute overnight events. Patient reports hiccups are improved.  Objective: Filed Vitals:   09/26/15 0854 09/26/15 1334 09/26/15 2242 09/27/15 0610  BP:  127/71 146/80 144/80  Pulse:  102 99 98  Temp:  97.3 F (36.3 C) 99.1 F (37.3 C) 98.5 F (36.9 C)  TempSrc:  Oral Oral Oral  Resp:  _0 Height:      Weight:      SpO2: 97% 97% 95% 96%    Intake/Output Summary (Last 24 hours) at 09/27/15 1022 Last data filed at 09/27/15 5733  Gross per 24 hour  Intake    480 ml  Output    800 ml  Net   -320 ml    Exam:   General:  Pt is not in acute distress  Cardiovascular: Rate controlled, appreciate S1-S2  Respiratory: Bilateral air entry, no wheezing  Abdomen: Nontender, nondistended abdomen, appreciate bowel sounds  Extremities: No edema, pulses palpable  Neuro: No focal neurological deficits, affect, mood and thought process normal  Data Reviewed: Basic Metabolic Panel:  Recent Labs Lab 09/22/15 1143 09/22/15 1411 09/23/15 0344 09/25/15 0520 09/26/15 0635 09/27/15 0352  NA 140  --  142 138 137  --   K 2.7*  --  3.4* 3.5 3.7  --   CL 101  --  111 107 108  --   CO2 25  --  22 22 21*  --   GLUCOSE 113*  --  98 95 89  --   BUN 26*  --  _1 --   CREATININE 0.98  --  0.65 0.58* 0.56*  --   CALCIUM 7.8*  --  7.4* 8.0* 7.8*  --   MG  --  1.5*  --  1.6* 1.6* 1.7   Liver Function Tests:  Recent Labs Lab 09/22/15 1143  AST 42*  ALT 17  ALKPHOS 90  BILITOT 6.0*  PROT 7.2  ALBUMIN 2.7*    Recent Labs Lab 09/22/15 1143  LIPASE 23   No results for input(s): AMMONIA in the last 168 hours. CBC:  Recent Labs Lab 09/22/15 1143 09/23/15 0344  WBC 6.7 4.9  NEUTROABS 6.2  --   HGB 11.3* 10.4*   HCT 32.9* 30.1*  MCV 102.2* 101.7*  PLT 146* 148*   Cardiac Enzymes: No results for input(s): CKTOTAL, CKMB, CKMBINDEX, TROPONINI in the last 168 hours. BNP: Invalid input(s): POCBNP CBG: No results for input(s): GLUCAP in the last 168 hours. Results for orders placed or performed during the hospital encounter of 09/22/15  Culture, blood (routine x 2)     Status: None   Collection Time: 09/22/15  1:42 PM  Result Value Ref Range Status   Specimen Description BLOOD BLOOD RIGHT FOREARM  Final   Special Requests BOTTLES DRAWN AEROBIC AND ANAEROBIC 5CC EACH  Final   Culture  Setup Time   Final    GRAM POSITIVE COCCI IN CHAINS IN BOTH AEROBIC AND ANAEROBIC BOTTLES CRITICAL RESULT CALLED TO, READ BACK BY AND VERIFIED WITH: J SUGGS _2  09/23/15 MKELLY    Culture   Final  STREPTOCOCCUS PNEUMONIAE Performed at [facility]    Report Status 09/25/2015 FINAL  Final   Organism ID, Bacteria STREPTOCOCCUS PNEUMONIAE  Final      Susceptibility   Streptococcus pneumoniae - MIC*    ERYTHROMYCIN <=0.12 SENSITIVE Sensitive     LEVOFLOXACIN 1 SENSITIVE Sensitive     VANCOMYCIN 0.25 SENSITIVE Sensitive     PENICILLIN <=0.06 SENSITIVE Sensitive     CEFTRIAXONE <=0.12 SENSITIVE Sensitive     * STREPTOCOCCUS PNEUMONIAE  Culture, blood (routine x 2)     Status: None (Preliminary result)   Collection Time: 09/22/15  2:15 PM  Result Value Ref Range Status   Specimen Description BLOOD LEFT HAND  Final   Special Requests IN PEDIATRIC BOTTLE 2CC  Final   Culture   Final    NO GROWTH 4 DAYS Performed at [facility]    Report Status PENDING  Incomplete  MRSA PCR Screening     Status: None   Collection Time: 09/22/15  4:36 PM  Result Value Ref Range Status   MRSA by PCR NEGATIVE NEGATIVE Final    Comment:        The GeneXpert MRSA Assay (FDA approved for NASAL specimens only), is one component of a comprehensive MRSA colonization surveillance program. It is not intended to  diagnose MRSA infection nor to guide or monitor treatment for MRSA infections.   Urine culture     Status: None   Collection Time: 09/22/15  6:23 PM  Result Value Ref Range Status   Specimen Description URINE, RANDOM  Final   Special Requests Immunocompromised  Final   Culture   Final    2,000 COLONIES/mL INSIGNIFICANT GROWTH Performed at [facility]    Report Status 09/23/2015 FINAL  Final  Culture, blood (routine x 2)     Status: None (Preliminary result)   Collection Time: 09/25/15  1:15 PM  Result Value Ref Range Status   Specimen Description BLOOD LEFT ARM  Final   Special Requests BOTTLES DRAWN AEROBIC AND ANAEROBIC  10CC  Final   Culture   Final    NO GROWTH < 24 HOURS Performed at [facility]    Report Status PENDING  Incomplete  Culture, blood (routine x 2)     Status: None (Preliminary result)   Collection Time: 09/25/15  1:15 PM  Result Value Ref Range Status   Specimen Description BLOOD LEFT ARM  Final   Special Requests BOTTLES DRAWN AEROBIC AND ANAEROBIC  10CC  Final   Culture   Final    NO GROWTH < 24 HOURS Performed at [facility]    Report Status PENDING  Incomplete    Scheduled Meds: . chlorproMAZINE  15 mg Oral TID  . dextromethorphan  30 mg Oral BID  . enoxaparin (LOVENOX) injection  40 mg Subcutaneous Q24H  . famotidine  20 mg Oral BID  . folic acid  1 mg Oral Daily  . levofloxacin (LEVAQUIN) IV  750 mg Intravenous Q24H  . magnesium oxide  400 mg Oral Daily  . multivitamin with minerals  1 tablet Oral Daily  . thiamine  100 mg Oral Daily   Continuous Infusions: . 0.9 % NaCl with KCl 40 mEq / L 10 mL/hr (09/26/15 1111)

## 2015-09-27 NOTE — Progress Notes (Signed)
    [facility] has been requested to perform a transesophageal echocardiogram on 09/28/15 for possible endocarditis.  After careful review of history and examination, the risks and benefits of transesophageal echocardiogram have been explained including risks of esophageal damage, perforation (1:10,000 risk), bleeding, pharyngeal hematoma as well as other potential complications associated with conscious sedation including aspiration, arrhythmia, respiratory failure and death. Alternatives to treatment were discussed, questions were answered. Patient is willing to proceed.  ( i spoke to this patient over the phone)  Eileen Stanford, PA-C 09/27/2015 10:51 AM

## 2015-09-27 NOTE — Progress Notes (Signed)
ANTIBIOTIC CONSULT NOTE - INITIAL  Pharmacy Consult for Ancef Indication: Bacteremia  Allergies  Allergen Reactions  . Doxycycline     REACTION: sleepy  . Omeprazole-Sodium Bicarbonate     REACTION: diarrhea  . Propranolol     tired    Patient Measurements: Height: 6' (182.9 cm) Weight: 171 lb 15.3 oz (78 kg) IBW/kg (Calculated) : 77.6   Vital Signs: Temp: 98.5 F (36.9 C) (01/08 0610) Temp Source: Oral (01/08 0610) BP: 144/80 mmHg (01/08 0610) Pulse Rate: 98 (01/08 0610) Intake/Output from previous day: 01/07 0701 - 01/08 0700 In: 720 [P.O.:720] Out: 1000 [Urine:1000] Intake/Output from this shift:    Labs:  Recent Labs  09/25/15 0520 09/26/15 0635  CREATININE 0.58* 0.56*   Estimated Creatinine Clearance: 111.8 mL/min (by C-G formula based on Cr of 0.56). No results for input(s): VANCOTROUGH, VANCOPEAK, VANCORANDOM, GENTTROUGH, GENTPEAK, GENTRANDOM, TOBRATROUGH, TOBRAPEAK, TOBRARND, AMIKACINPEAK, AMIKACINTROU, AMIKACIN in the last 72 hours.   Microbiology: Recent Results (from the past 720 hour(s))  Culture, blood (routine x 2)     Status: None   Collection Time: 09/22/15  1:42 PM  Result Value Ref Range Status   Specimen Description BLOOD BLOOD RIGHT FOREARM  Final   Special Requests BOTTLES DRAWN AEROBIC AND ANAEROBIC 5CC EACH  Final   Culture  Setup Time   Final    GRAM POSITIVE COCCI IN CHAINS IN BOTH AEROBIC AND ANAEROBIC BOTTLES CRITICAL RESULT CALLED TO, READ BACK BY AND VERIFIED WITH: J SUGGS @0657  09/23/15 MKELLY    Culture   Final    STREPTOCOCCUS PNEUMONIAE Performed at [facility]    Report Status 09/25/2015 FINAL  Final   Organism ID, Bacteria STREPTOCOCCUS PNEUMONIAE  Final      Susceptibility   Streptococcus pneumoniae - MIC*    ERYTHROMYCIN <=0.12 SENSITIVE Sensitive     LEVOFLOXACIN 1 SENSITIVE Sensitive     VANCOMYCIN 0.25 SENSITIVE Sensitive     PENICILLIN <=0.06 SENSITIVE Sensitive     CEFTRIAXONE <=0.12 SENSITIVE  Sensitive     * STREPTOCOCCUS PNEUMONIAE  Culture, blood (routine x 2)     Status: None (Preliminary result)   Collection Time: 09/22/15  2:15 PM  Result Value Ref Range Status   Specimen Description BLOOD LEFT HAND  Final   Special Requests IN PEDIATRIC BOTTLE 2CC  Final   Culture   Final    NO GROWTH 4 DAYS Performed at [facility]    Report Status PENDING  Incomplete  MRSA PCR Screening     Status: None   Collection Time: 09/22/15  4:36 PM  Result Value Ref Range Status   MRSA by PCR NEGATIVE NEGATIVE Final    Comment:        The GeneXpert MRSA Assay (FDA approved for NASAL specimens only), is one component of a comprehensive MRSA colonization surveillance program. It is not intended to diagnose MRSA infection nor to guide or monitor treatment for MRSA infections.   Urine culture     Status: None   Collection Time: 09/22/15  6:23 PM  Result Value Ref Range Status   Specimen Description URINE, RANDOM  Final   Special Requests Immunocompromised  Final   Culture   Final    2,000 COLONIES/mL INSIGNIFICANT GROWTH Performed at [facility]    Report Status 09/23/2015 FINAL  Final  Culture, blood (routine x 2)     Status: None (Preliminary result)   Collection Time: 09/25/15  1:15 PM  Result Value Ref Range Status  Specimen Description BLOOD LEFT ARM  Final   Special Requests BOTTLES DRAWN AEROBIC AND ANAEROBIC  10CC  Final   Culture   Final    NO GROWTH < 24 HOURS Performed at [facility]    Report Status PENDING  Incomplete  Culture, blood (routine x 2)     Status: None (Preliminary result)   Collection Time: 09/25/15  1:15 PM  Result Value Ref Range Status   Specimen Description BLOOD LEFT ARM  Final   Special Requests BOTTLES DRAWN AEROBIC AND ANAEROBIC  10CC  Final   Culture   Final    NO GROWTH < 24 HOURS Performed at [facility]    Report Status PENDING  Incomplete    Medical History: History reviewed. No pertinent past  medical history.  Medications:  Scheduled:  . chlorproMAZINE  15 mg Oral TID  . dextromethorphan  30 mg Oral BID  . enoxaparin (LOVENOX) injection  40 mg Subcutaneous Q24H  . famotidine  20 mg Oral BID  . folic acid  1 mg Oral Daily  . magnesium oxide  400 mg Oral Daily  . multivitamin with minerals  1 tablet Oral Daily  . thiamine  100 mg Oral Daily   Infusions:  . 0.9 % NaCl with KCl 40 mEq / L 10 mL/hr (09/26/15 1111)   PRN: acetaminophen, LORazepam, ondansetron (ZOFRAN) IV   Assessment: 39yoM with PMH of asthma, HTN and h/o of alcohol abuse who presented to the ER on 09/22/15 with ongoing N/V/D x 5 days.  He was started on Rocephin and Azithromycin for CAP and then switched to Levaquin. Pt was found to have positive streptococcal pneumonia antigen in addition to streptococcus pneumoniae in one of the blood cultures collected on the admission. Levaquin being switched to Ancef per infectious disease recommendation.  2-D echo shows possible mobile mass on the mitral valve concerning for SBE.  Cardiology to set up TEE tomorrow.   Goal of Therapy:  Appropriate antibiotic dosing for indication and renal function. Eradication of infection   Plan:  Will start Ancef 2gm IV Q8h.  Will f/u Scr and Cx   Garnet Sierras 09/27/2015,10:42 AM

## 2015-09-28 DIAGNOSIS — R509 Fever, unspecified: Secondary | ICD-10-CM

## 2015-09-28 DIAGNOSIS — J13 Pneumonia due to Streptococcus pneumoniae: Secondary | ICD-10-CM

## 2015-09-28 DIAGNOSIS — F1020 Alcohol dependence, uncomplicated: Secondary | ICD-10-CM

## 2015-09-28 LAB — BASIC METABOLIC PANEL
Anion gap: 8 (ref 5–15)
BUN: 6 mg/dL (ref 6–20)
CO2: 24 mmol/L (ref 22–32)
CREATININE: 0.68 mg/dL (ref 0.61–1.24)
Calcium: 8.4 mg/dL — ABNORMAL LOW (ref 8.9–10.3)
Chloride: 108 mmol/L (ref 101–111)
Glucose, Bld: 90 mg/dL (ref 65–99)
POTASSIUM: 3.5 mmol/L (ref 3.5–5.1)
SODIUM: 140 mmol/L (ref 135–145)

## 2015-09-28 LAB — CBC
HCT: 27.7 % — ABNORMAL LOW (ref 39.0–52.0)
Hemoglobin: 9.4 g/dL — ABNORMAL LOW (ref 13.0–17.0)
MCH: 34.8 pg — AB (ref 26.0–34.0)
MCHC: 33.9 g/dL (ref 30.0–36.0)
MCV: 102.6 fL — ABNORMAL HIGH (ref 78.0–100.0)
PLATELETS: 342 10*3/uL (ref 150–400)
RBC: 2.7 MIL/uL — AB (ref 4.22–5.81)
RDW: 17.5 % — ABNORMAL HIGH (ref 11.5–15.5)
WBC: 9.9 10*3/uL (ref 4.0–10.5)

## 2015-09-28 NOTE — Progress Notes (Signed)
Spoke with endo at [facility] who schedule the TEE.  Was informed that [name] TEE would be performed at 11 a.m. Tuesday 09/29/15.  Spoke with [facility] who are to pick patient up between 9 and 9:30 to take to [facility].  Patient and Dr. Charlies Silvers were informed of the above.

## 2015-09-28 NOTE — Consult Note (Signed)
[facility]  Total days of antibiotics 7        Day 2 cefazolin               Reason for Consult: possible strep pneumo NVE   Referring Physician: devine  Principal Problem:   Acute respiratory failure with hypoxia ([place]) Active Problems:   GERD   Hypokalemia   Hypomagnesemia   Streptococcal pneumonia ([place])   Bacteremia due to Streptococcus pneumoniae   History of alcohol abuse   Anemia of chronic disease   Thrombocytopenia (H[facility])    HPI: Jerico Cruse is a 60 y.o. male  with a PMH of asthma, HTN and alcohol dependence who was admitted on 1/3 who presents with 5 day history of diarrhea, emesis and cough.  He also reported having subjective fever, fatigue, weight loss, wheezing, occasional abdominal pain and weakness. On admit, he was found to have left shift with wbc of 6.7, CXR shows LUL infiltrate. Other labs of interests showed + strep pneumo antigen as well as 1 of 2 blood cx + for s. Pneumo. He underwent TTE that showed possible small mobile mass to anterior mv. He remains afebrile, repeat bloodcx on 1/6 is ngtd. He is slated for TEE. ID consultation sought for determining length of treatment.  He feels better than when he was admitted. He reports that he is only having 1 bm per day, formed. He denies any abdominal cramping. He did have episode of hiccups that our now improved. No jittering, sweating, heart racing from not having any alcohol.  Active Ambulatory Problems    Diagnosis Date Noted  . GERD 10/15/2007   Resolved Ambulatory Problems    Diagnosis Date Noted  . No Resolved Ambulatory Problems   No Additional Past Medical History     Allergies:  Allergies  Allergen Reactions  . Doxycycline     REACTION: sleepy  . Omeprazole-Sodium Bicarbonate     REACTION: diarrhea  . Propranolol     tired     MEDICATIONS: .  ceFAZolin (ANCEF) IV  2 g Intravenous Q8H  . chlorproMAZINE  15 mg Oral TID  . dextromethorphan  30 mg Oral BID  .  enoxaparin (LOVENOX) injection  40 mg Subcutaneous Q24H  . famotidine  20 mg Oral BID  . folic acid  1 mg Oral Daily  . magnesium oxide  400 mg Oral Daily  . multivitamin with minerals  1 tablet Oral Daily  . thiamine  100 mg Oral Daily    Social History  Substance Use Topics  . Smoking status: Former Research scientist (life sciences)  . Smokeless tobacco: Never Used  . Alcohol Use: 0.0 oz/week     Comment: gin 1/2 pint beer, stopped in 2011; 1-2 beers/d in 2013, pint of liquor a day on1/3/16    Family History  Problem Relation Age of Onset  . Hypertension Other   . Heart disease Father 109    MI  . Hypertension Father   . Hypertension Mother   . Diabetes Mother   . Diabetes Maternal Aunt   . Stroke Maternal Aunt   . Diabetes Maternal Grandmother      Review of Systems  Constitutional: Negative for fever, chills, diaphoresis, activity change, appetite change, fatigue and unexpected weight change.  HENT: Negative for congestion, sore throat, rhinorrhea, sneezing, trouble swallowing and sinus pressure.  Eyes: Negative for photophobia and visual disturbance.  Respiratory: positive for cough,but negative  chest tightness, shortness of breath, wheezing and stridor.  Cardiovascular: Negative for chest pain, palpitations and leg swelling.  Gastrointestinal: positive for hiccups. But Negative for nausea, vomiting, abdominal pain, diarrhea, constipation, blood in stool, abdominal distention and anal bleeding.  Genitourinary: Negative for dysuria, hematuria, flank pain and difficulty urinating.  Musculoskeletal: Negative for myalgias, back pain, joint swelling, arthralgias and gait problem.  Skin: Negative for color change, pallor, rash and wound.  Neurological: Negative for dizziness, tremors, weakness and light-headedness.  Hematological: Negative for adenopathy. Does not bruise/bleed easily.  Psychiatric/Behavioral: Negative for behavioral problems, confusion, sleep disturbance, dysphoric mood, decreased  concentration and agitation.     OBJECTIVE: Temp:  [97.5 F (36.4 C)-99.8 F (37.7 C)] 98.4 F (36.9 C) (01/09 0556) Pulse Rate:  [96-100] 100 (01/09 0556) Resp:  [18] 18 (01/09 0556) BP: (131-159)/(77-82) 159/82 mmHg (01/09 0556) SpO2:  [95 %-98 %] 95 % (01/09 0556) Physical Exam  Constitutional: He is oriented to person, place, and time. He appears well-developed and well-nourished. No distress.  HENT: pale conjunctiva Mouth/Throat: Oropharynx is clear and moist. No oropharyngeal exudate. Poor dentition Cardiovascular: Normal rate, regular rhythm and normal heart sounds. Exam reveals no gallop and no friction rub.  No murmur heard.  Pulmonary/Chest: Effort normal and breath sounds normal. No respiratory distress. He has no wheezes.  Abdominal: Soft. Bowel sounds are normal. He exhibits no distension. There is no tenderness.  Lymphadenopathy:  He has no cervical adenopathy.  Neurological: He is alert and oriented to person, place, and time.  Skin: Skin is warm and dry. No rash noted. No erythema.  Psychiatric: He has a normal mood and affect. His behavior is normal.   LABS: Lab Results  Component Value Date   WBC 4.9 09/23/2015   HGB 10.4* 09/23/2015   HCT 30.1* 09/23/2015   MCV 101.7* 09/23/2015   PLT 148* 09/23/2015   BMET    Component Value Date/Time   NA 137 09/26/2015 0635   K 3.7 09/26/2015 0635   CL 108 09/26/2015 0635   CO2 21* 09/26/2015 0635   GLUCOSE 89 09/26/2015 0635   GLUCOSE 113* 09/05/2006 1720   BUN 10 09/26/2015 0635   CREATININE 0.56* 09/26/2015 0635   CREATININE 0.96 06/09/2014 1148   CALCIUM 7.8* 09/26/2015 0635   GFRNONAA >60 09/26/2015 0635   GFRNONAA 88 06/09/2014 1148   GFRAA >60 09/26/2015 0635   GFRAA >89 06/09/2014 1148     MICRO: 1/3 blood cx 1 of 2 + s.pneumo 1/6 blood cx ngtd IMAGING: 1/3 cxr per my read shows RUL infiltrate 1/6 echo + mobile mass, small on MV  Assessment/Plan:  60 yo M admitted for malaise, fever,  diarrhea, cough. Found to have strep pneumo pneumonia with associated bacteremia. 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   - await for TEE results to determine length of therapy ( 14 d vs. 6 wk), if TEE is positive, then can place central line for prolonged antibiotics. For discharge, can switch to ceftriaxone 2gm iv daily. If TEE is negative, then we can consider treating with orals and not need picc line  - for health screening will check for hep C  - alcohol dependence = continue with ciwa protocol. Appears not having any symptoms of withdrawal  Thank you for consultation Caren Griffins B. [facility] for [facility] [phone]

## 2015-09-28 NOTE — Progress Notes (Addendum)
Patient ID: Johnny Mcknight, male   DOB: 11-Feb-1956, 60 y.o.   MRN: [id number] [facility] PROGRESS NOTE  Yani Lal FIE:[id number] DOB: [birth year] DOA: 09/22/2015 PCP: No primary care provider on file. will set up with community [place] [facility]  Brief narrative:    60 y.o. male with past medical history of asthma, hypertension, history of alcohol abuse who presented to [facility] hospital with reports of ongoing nausea, vomiting, diarrhea for past 5 days prior to this admission. He has been drinking vodka on daily basis. Patient also reported subjective fever and wheezing.   On admission chest x-ray demonstrated airspace consolidation consistent with pneumonia in the right lung. He was given azithromycin and Rocephin in ED and then was started on Levaquin to cover for possible aspiration.   During this hospital stay patient was found to have positive streptococcal pneumonia antigen in addition to streptococcus pneumoniae in one of the blood cultures collected on the admission. He was on Levaquin but we changed that to Ancef per infectious disease 09/27/2015. His 2-D echo had possible mobile mass on mitral valve concerning for SBE. Cardiology plans for TEE today.   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 CXR on admission showed right upper lung lobe pneumonia which is source of infection. - Now we also note the patient has strep pneumonia based on strep pneumonia antigen positive result and streptococcus pneumonia in blood cultures obtained on the admission. Please note it was one of the blood cultures that was positive for Streptococcus pneumonia. The other one show no growth. - We repeated blood cultures 09/27/2015 and  there are negative so far - As part of streptococcal bacteremia evaluation we ordered 2-D echo which was concerning for mitral valve possibel small mobile mass on atrial surface of anterior mitral leaflet suggest TEE was suggested if clinical suspicion for SBE - Cardiology plans for TEE today - Infectious disease will see the patient in consultation - Please note patient was on azithromycin and Rocephin given in ED which was then changed to [place] but with possible endocarditis antibiotics were changed to Ancef 09/27/2015 per infectious disease recommendations  Active Problems: History of alcohol abuse / alcohol withdrawal without complications - Alcohol level was within normal limits at the time of the admission - Continue multivitamin, folic acid and thiamine  GERD - Continue Pepcid  Hypokalemia / hypomagnesemia - Secondary to alcohol abuse, dietary depletion - Supplemented  - Patient is on magnesium oxide daily  Anemia of chronic disease / thrombocytopenia - Secondary to history of alcohol abuse - Hemoglobin is stable at 10.4, platelets are 148 - Check CBC and BMP today  Hiccups - Thorazine seems to be helping   DVT Prophylaxis  - Lovenox subQ   Code Status: Full.  Family Communication:  plan of care discussed with the patient and his wife at the bedside Disposition Plan: Plan for TEE today  IV access:  Peripheral IV  Procedures and diagnostic studies:    Dg Chest 2 View 09/22/2015  Airspace consolidation consistent with pneumonia throughout the axillary subsegment and posterior segment of the right upper lobe. Lungs elsewhere clear. Followup PA and lateral chest radiographs recommended in 3-4 weeks following trial of antibiotic therapy to ensure resolution and exclude underlying malignancy.   Medical Consultants:  Cardio ID  Other Consultants:  Physical therapy   IAnti-Infectives:   Ancef 09/27/2015 --> Levaquin 09/23/2015 --> 09/27/2015 Azithromycin and Rocephin  on admission 1 dose   Leisa Lenz, MD  [facility] Pager [phone]  Time spent in minutes: 25 minutes  If 7PM-7AM, please contact night-coverage [email or web address] Password [email or web address] 09/28/2015, 10:24 AM   LOS: 6 days    HPI/Subjective: No acute overnight events. Patient says he feels better today.  Objective: Filed Vitals:   09/27/15 0610 09/27/15 1340 09/27/15 2157 09/28/15 0556  BP: 144/80 131/77 140/80 159/82  Pulse: 98 96 100 100  Temp: 98.5 F (36.9 C) 97.5 F (36.4 C) 99.8 F (37.7 C) 98.4 F (36.9 C)  TempSrc: Oral Oral Oral Oral  Resp: _0 Height:      Weight:      SpO2: 96% 97% 98% 95%    Intake/Output Summary (Last 24 hours) at 09/28/15 1024 Last data filed at 09/28/15 6010  Gross per 24 hour  Intake    653 ml  Output   1200 ml  Net   -547 ml    Exam:   General:  Pt is alert, awake, no distress  Cardiovascular: S1-S2 appreciated, rate controlled  Respiratory: Clear to auscultation bilaterally, no wheezing  Abdomen: Appreciate bowel sounds, nontender abdomen  Extremities: No lower extremity swelling, palpable pulses bilaterally  Neuro: Nonfocal  Data Reviewed: Basic Metabolic Panel:  Recent Labs Lab 09/22/15 1143 09/22/15 1411 09/23/15 0344 09/25/15 0520 09/26/15 0635 09/27/15 0352  NA 140  --  142 138 137  --   K 2.7*  --  3.4* 3.5 3.7  --   CL 101  --  111 107 108  --   CO2 25  --  22 22 21*  --   GLUCOSE 113*  --  98 95 89  --   BUN 26*  --  _1 --   CREATININE 0.98  --  0.65 0.58* 0.56*  --   CALCIUM 7.8*  --  7.4* 8.0* 7.8*  --   MG  --  1.5*  --  1.6* 1.6* 1.7   Liver Function Tests:  Recent Labs Lab 09/22/15 1143  AST 42*  ALT 17  ALKPHOS 90  BILITOT 6.0*  PROT 7.2  ALBUMIN 2.7*    Recent Labs Lab 09/22/15 1143  LIPASE 23   No results for input(s): AMMONIA in the last 168 hours. CBC:  Recent Labs Lab 09/22/15 1143 09/23/15 0344  WBC 6.7 4.9  NEUTROABS 6.2  --   HGB 11.3* 10.4*  HCT  32.9* 30.1*  MCV 102.2* 101.7*  PLT 146* 148*   Cardiac Enzymes: No results for input(s): CKTOTAL, CKMB, CKMBINDEX, TROPONINI in the last 168 hours. BNP: Invalid input(s): POCBNP CBG: No results for input(s): GLUCAP in the last 168 hours. Results for orders placed or performed during the hospital encounter of 09/22/15  Culture, blood (routine x 2)     Status: None   Collection Time: 09/22/15  1:42 PM  Result Value Ref Range Status   Specimen Description BLOOD BLOOD RIGHT FOREARM  Final   Special Requests BOTTLES DRAWN AEROBIC AND ANAEROBIC 5CC EACH  Final   Culture  Setup Time   Final    GRAM POSITIVE COCCI IN CHAINS IN BOTH AEROBIC AND ANAEROBIC BOTTLES CRITICAL RESULT CALLED TO, READ BACK BY AND VERIFIED WITH: J SUGGS _2  09/23/15 MKELLY    Culture   Final    STREPTOCOCCUS PNEUMONIAE Performed at [facility]    Report Status 09/25/2015 FINAL  Final   Organism ID, Bacteria STREPTOCOCCUS PNEUMONIAE  Final      Susceptibility   Streptococcus pneumoniae - MIC*    ERYTHROMYCIN <=0.12 SENSITIVE Sensitive     LEVOFLOXACIN 1 SENSITIVE Sensitive     VANCOMYCIN 0.25 SENSITIVE Sensitive     PENICILLIN <=0.06 SENSITIVE Sensitive     CEFTRIAXONE <=0.12 SENSITIVE Sensitive     * STREPTOCOCCUS PNEUMONIAE  Culture, blood (routine x 2)     Status: None   Collection Time: 09/22/15  2:15 PM  Result Value Ref Range Status   Specimen Description BLOOD LEFT HAND  Final   Special Requests IN PEDIATRIC BOTTLE 2CC  Final   Culture   Final    NO GROWTH 5 DAYS Performed at [facility]    Report Status 09/27/2015 FINAL  Final  MRSA PCR Screening     Status: None   Collection Time: 09/22/15  4:36 PM  Result Value Ref Range Status   MRSA by PCR NEGATIVE NEGATIVE Final    Comment:        The GeneXpert MRSA Assay (FDA approved for NASAL specimens only), is one component of a comprehensive MRSA colonization surveillance program. It is not intended to diagnose MRSA infection  nor to guide or monitor treatment for MRSA infections.   Urine culture     Status: None   Collection Time: 09/22/15  6:23 PM  Result Value Ref Range Status   Specimen Description URINE, RANDOM  Final   Special Requests Immunocompromised  Final   Culture   Final    2,000 COLONIES/mL INSIGNIFICANT GROWTH Performed at [facility]    Report Status 09/23/2015 FINAL  Final  Culture, blood (routine x 2)     Status: None (Preliminary result)   Collection Time: 09/25/15  1:15 PM  Result Value Ref Range Status   Specimen Description BLOOD LEFT ARM  Final   Special Requests BOTTLES DRAWN AEROBIC AND ANAEROBIC  10CC  Final   Culture   Final    NO GROWTH 2 DAYS Performed at [facility]    Report Status PENDING  Incomplete  Culture, blood (routine x 2)     Status: None (Preliminary result)   Collection Time: 09/25/15  1:15 PM  Result Value Ref Range Status   Specimen Description BLOOD LEFT ARM  Final   Special Requests BOTTLES DRAWN AEROBIC AND ANAEROBIC  10CC  Final   Culture   Final    NO GROWTH 2 DAYS Performed at [facility]    Report Status PENDING  Incomplete    Scheduled Meds: .  ceFAZolin (ANCEF) IV  2 g Intravenous Q8H  . chlorproMAZINE  15 mg Oral TID  . dextromethorphan  30 mg Oral BID  . enoxaparin (LOVENOX) injection  40 mg Subcutaneous Q24H  . famotidine  20 mg Oral BID  . folic acid  1 mg Oral Daily  . magnesium oxide  400 mg Oral Daily  . multivitamin with minerals  1 tablet Oral Daily  . thiamine  100 mg Oral Daily   Continuous Infusions: . 0.9 % NaCl with KCl 40 mEq / L 10 mL/hr (09/26/15 1111)

## 2015-09-28 NOTE — Progress Notes (Signed)
Physical Therapy Treatment Patient Details Name: Osher Thu MRN: [id number] DOB: 1955/12/21 Today's Date: 05-Oct-2015    History of Present Illness 60 yo male admitted with aspiration pna, acute resp failure, sepsis, diarrhea. Hx of ETOH abuse, non compliance, asthma, HTN.     PT Comments    Pt progressing well; reports he has not been walking with nursing staff (? Did not verify with nursing), will continue to follow  Follow Up Recommendations  Home health PT;Supervision for mobility/OOB     Equipment Recommendations  None recommended by PT    Recommendations for Other Services       Precautions / Restrictions Precautions Precautions: Fall Restrictions Weight Bearing Restrictions: No    Mobility  Bed Mobility Overal bed mobility: Modified Independent             General bed mobility comments: requires slightly incr time and HOB elevated, although no physical assist  Transfers Overall transfer level: Needs assistance Equipment used: None Transfers: Sit to/from Stand Sit to Stand: Supervision;Modified independent (Device/Increase time)         General transfer comment: for safety  Ambulation/Gait Ambulation/Gait assistance: Supervision Ambulation Distance (Feet): 400 Feet Assistive device: None Gait Pattern/deviations: Step-through pattern;Drifts right/left;Narrow base of support;Decreased stride length   Gait velocity interpretation: Below normal speed for age/gender General Gait Details: close guard for safety. unsteady at times. did not use IV pole but required one standing rest today; pt reports he occasionally uses cane d/t his "knee"   Stairs            Wheelchair Mobility    Modified Rankin (Stroke Patients Only)       Balance             Standing balance-Leahy Scale: Fair (to good) Standing balance comment: improving balance, able to reach ~6" outside his BOS              High level balance activites: Backward  walking;Turns;Head turns;Side stepping      Cognition Arousal/Alertness: Awake/alert Behavior During Therapy: WFL for tasks assessed/performed Overall Cognitive Status: Within Functional Limits for tasks assessed                      Exercises      General Comments        Pertinent Vitals/Pain Pain Assessment: No/denies pain    Home Living                      Prior Function            PT Goals (current goals can now be found in the care plan section) Acute Rehab PT Goals Patient Stated Goal: home soon PT Goal Formulation: With patient Time For Goal Achievement: 10/08/15 Potential to Achieve Goals: Good Progress towards PT goals: Progressing toward goals    Frequency  Min 3X/week    PT Plan Current plan remains appropriate    Co-evaluation             End of Session Equipment Utilized During Treatment: Gait belt Activity Tolerance: Patient tolerated treatment well Patient left: in chair;with call bell/phone within reach;with chair alarm set     Time: [id number] PT Time Calculation (min) (ACUTE ONLY): 27 min  Charges:  $Gait Training: 23-37 mins                    G Codes:      Donyelle Enyeart 10/05/15, 4:20 PM

## 2015-09-28 NOTE — Progress Notes (Signed)
Pt was sitting up in chair finishing dinner when I arrived. He said he has been here a week b/c of pneumonia. He said he is better but they keep finding something else and he has to stay here. He said he wasn't complaining, but admitted he is ready to go home. Please page if additional spt is needed. Chaplain Ernest Haber, M.Div.   09/28/15 1900  Clinical Encounter Type  Visited With Patient

## 2015-09-29 ENCOUNTER — Encounter (HOSPITAL_COMMUNITY): Payer: Self-pay | Admitting: *Deleted

## 2015-09-29 ENCOUNTER — Inpatient Hospital Stay (HOSPITAL_COMMUNITY): Payer: Medicare HMO

## 2015-09-29 ENCOUNTER — Encounter (HOSPITAL_COMMUNITY)
Admission: EM | Disposition: A | Discharge status: Home Health | Payer: Self-pay | Source: Home / Self Care | Attending: Internal Medicine

## 2015-09-29 DIAGNOSIS — R7881 Bacteremia: Secondary | ICD-10-CM

## 2015-09-29 DIAGNOSIS — I340 Nonrheumatic mitral (valve) insufficiency: Secondary | ICD-10-CM

## 2015-09-29 DIAGNOSIS — J154 Pneumonia due to other streptococci: Secondary | ICD-10-CM

## 2015-09-29 HISTORY — PX: TEE WITHOUT CARDIOVERSION: SHX5443

## 2015-09-29 LAB — CREATININE, SERUM
CREATININE: 0.58 mg/dL — AB (ref 0.61–1.24)
GFR calc non Af Amer: >60 mL/min (ref >60)

## 2015-09-29 SURGERY — ECHOCARDIOGRAM, TRANSESOPHAGEAL
Anesthesia: Moderate Sedation

## 2015-09-29 MED ORDER — GUAIFENESIN-DM 100-10 MG/5ML PO SYRP: 5.0000 mL | ORAL_SOLUTION | ORAL | Status: DC | PRN

## 2015-09-29 MED ORDER — BUTAMBEN-TETRACAINE-BENZOCAINE 2-2-14 % EX AERO
INHALATION_SPRAY | CUTANEOUS | Status: DC | PRN
  Administered 2015-09-29: 2 via TOPICAL

## 2015-09-29 MED ORDER — FENTANYL CITRATE (PF) 100 MCG/2ML IJ SOLN
INTRAMUSCULAR | Status: DC | PRN
  Administered 2015-09-29 (×2): 25 ug via INTRAVENOUS

## 2015-09-29 MED ORDER — SODIUM CHLORIDE 0.9 % IV SOLN
INTRAVENOUS | Status: DC
  Administered 2015-09-29: 10:00:00 via INTRAVENOUS

## 2015-09-29 MED ORDER — MIDAZOLAM HCL 5 MG/ML IJ SOLN
INTRAMUSCULAR | Status: AC
  Filled 2015-09-29: qty 2

## 2015-09-29 MED ORDER — FENTANYL CITRATE (PF) 100 MCG/2ML IJ SOLN
INTRAMUSCULAR | Status: AC
  Filled 2015-09-29: qty 2

## 2015-09-29 MED ORDER — MIDAZOLAM HCL 10 MG/2ML IJ SOLN
INTRAMUSCULAR | Status: DC | PRN
  Administered 2015-09-29 (×2): 2 mg via INTRAVENOUS

## 2015-09-29 NOTE — Interval H&P Note (Signed)
History and Physical Interval Note:  09/29/2015 11:11 AM  Johnny Mcknight  has presented today for surgery, with the diagnosis of ENDOCARDITIS  The various methods of treatment have been discussed with the patient and family. After consideration of risks, benefits and other options for treatment, the patient has consented to  Procedure(s): TRANSESOPHAGEAL ECHOCARDIOGRAM (TEE) (N/A) as a surgical intervention .  The patient's history has been reviewed, patient examined, no change in status, stable for surgery.  I have reviewed the patient's chart and labs.  Questions were answered to the patient's satisfaction.     Bryan Goin

## 2015-09-29 NOTE — Progress Notes (Signed)
Echocardiogram Echocardiogram Transesophageal has been performed.  Joelene Millin 09/29/2015, 11:54 AM

## 2015-09-29 NOTE — CV Procedure (Signed)
    TEE  Indications: Evaluate mitral valve ?SBE  Time out performed.   Findings:   - There is a redundant anterior mitral leaflet chordae demonstrating motion into the outflow tract.   - Does not appear to be vegetation.   - normal EF 60%  - Mild MR  Candee Furbish, MD

## 2015-09-29 NOTE — Progress Notes (Signed)
Patient ID: Johnny Mcknight, male   DOB: 1956/06/23, 60 y.o.   MRN: [id number] [facility] PROGRESS NOTE  Johnny Mcknight MVE:[id number] DOB: 23-May-1956 DOA: 09/22/2015 PCP: No primary care provider on file. will set up with community [place] [facility]  Brief narrative:    60 y.o. male with past medical history of asthma, hypertension, history of alcohol abuse who presented to [facility] hospital with reports of ongoing nausea, vomiting, diarrhea for past 5 days prior to this admission. He has been drinking vodka on daily basis. Patient also reported subjective fever and wheezing.   On admission chest x-ray demonstrated airspace consolidation consistent with pneumonia in the right lung. He was given azithromycin and Rocephin in ED and then was started on Levaquin to cover for possible aspiration.   During this hospital stay patient was found to have positive streptococcal pneumonia antigen in addition to streptococcus pneumoniae in one of the blood cultures collected on the admission. He was on Levaquin but we changed that to Ancef per infectious disease 09/27/2015. His 2-D echo had possible mobile mass on mitral valve concerning for SBE. Cardiology plans for TEE today.   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 CXR on admission showed right upper lung lobe pneumonia which is source of infection. - Now we know pt has strep pneumonia based on strep pneumonia antigen positive result and streptococcus pneumonia in blood cultures obtained on the admission. Please note it was one of the blood cultures that was positive for Streptococcus pneumonia. The other blood culture showed no growth.  - Repeat bblood cultures 09/27/2015 show no  growth so far. - As part of streptococcal bacteremia evaluation we ordered 2-D echo which was concerning for mitral valve possibel small mobile mass on atrial surface of anterior mitral leaflet suggest TEE was suggested if clinical suspicion for SBE - Cardiology plans for TEE today - Infectious disease following as well - Please note patient was on azithromycin and Rocephin given in ED which was then changed to Levaquin but with possible endocarditis antibiotics were finally changed to Ancef 09/27/2015 per infectious disease recommendations  Active Problems: History of alcohol abuse / alcohol withdrawal without complications - Alcohol level was within normal limits at the time of the admission - Continue multivitamin, folic acid and thiamine - Stable - No reports of withdrawals   GERD - Continue Pepcid  Hypokalemia / hypomagnesemia - Secondary to alcohol abuse, dietary depletion - Supplemented  - Continue magnesium supplementation daily   Anemia of chronic disease / thrombocytopenia - Secondary to history of alcohol abuse - Hemoglobin is stable at 9.4, platelets normalized   Hiccups - Thorazine seems to be helping   DVT Prophylaxis  - Lovenox subQ ordered   Code Status: Full.  Family Communication:  plan of care discussed with the patient and his wife at the bedside Disposition Plan: Plan for TEE today  IV access:  Peripheral IV  Procedures and diagnostic studies:    Dg Chest 2 View 09/22/2015  Airspace consolidation consistent with pneumonia throughout the axillary subsegment and posterior segment of the right upper lobe. Lungs elsewhere clear. Followup PA and lateral chest radiographs recommended in 3-4 weeks following trial of antibiotic therapy to ensure resolution and exclude underlying malignancy.   Medical Consultants:  Cardio ID  Other  Consultants:  Physical therapy   IAnti-Infectives:   Ancef 09/27/2015 --> Levaquin 09/23/2015 --> 09/27/2015 Azithromycin and  Rocephin on admission 1 dose   Leisa Lenz, MD  [facility] Pager [phone]  Time spent in minutes: 25 minutes  If 7PM-7AM, please contact night-coverage [email or web address] Password [email or web address] 09/29/2015, 10:46 AM   LOS: 7 days    HPI/Subjective: No acute overnight events. Patient says he is coughing more today.   Objective: Filed Vitals:   09/28/15 1310 09/28/15 2031 09/29/15 0603 09/29/15 0933  BP: 135/87 131/85 152/83 149/94  Pulse: 100 108 97 94  Temp: 98.7 F (37.1 C) 98.6 F (37 C) 98.6 F (37 C) 98.6 F (37 C)  TempSrc: Oral Oral Oral Oral  Resp: _0 Height:      Weight:      SpO2: 97% 99% 96% 97%    Intake/Output Summary (Last 24 hours) at 09/29/15 1046 Last data filed at 09/29/15 0800  Gross per 24 hour  Intake    370 ml  Output   1200 ml  Net   -830 ml    Exam:   General:  Pt is not in distress  Cardiovascular: RRR, (+) S1, S2   Respiratory: Congested, no wheezing   Abdomen: non tender, non distended, (+) BS  Extremities: No edema, palpable pulses   Neuro: No focal deficits   Data Reviewed: Basic Metabolic Panel:  Recent Labs Lab 09/22/15 1143 09/22/15 1411 09/23/15 0344 09/25/15 0520 09/26/15 0635 09/27/15 0352 09/28/15 1138 09/29/15 0350  NA 140  --  142 138 137  --  140  --   K 2.7*  --  3.4* 3.5 3.7  --  3.5  --   CL 101  --  111 107 108  --  108  --   CO2 25  --  22 22 21*  --  24  --   GLUCOSE 113*  --  98 95 89  --  90  --   BUN 26*  --  _1 --  6  --   CREATININE 0.98  --  0.65 0.58* 0.56*  --  0.68 0.58*  CALCIUM 7.8*  --  7.4* 8.0* 7.8*  --  8.4*  --   MG  --  1.5*  --  1.6* 1.6* 1.7  --   --    Liver Function Tests:  Recent Labs Lab 09/22/15 1143  AST 42*  ALT 17  ALKPHOS 90  BILITOT 6.0*  PROT 7.2  ALBUMIN 2.7*    Recent Labs Lab 09/22/15 1143  LIPASE 23   No results for input(s): AMMONIA in the last 168 hours. CBC:  Recent Labs Lab 09/22/15 1143 09/23/15 0344 09/28/15 1138   WBC 6.7 4.9 9.9  NEUTROABS 6.2  --   --   HGB 11.3* 10.4* 9.4*  HCT 32.9* 30.1* 27.7*  MCV 102.2* 101.7* 102.6*  PLT 146* 148* 342   Cardiac Enzymes: No results for input(s): CKTOTAL, CKMB, CKMBINDEX, TROPONINI in the last 168 hours. BNP: Invalid input(s): POCBNP CBG: No results for input(s): GLUCAP in the last 168 hours. Results for orders placed or performed during the hospital encounter of 09/22/15  Culture, blood (routine x 2)     Status: None   Collection Time: 09/22/15  1:42 PM  Result Value Ref Range Status   Specimen Description BLOOD BLOOD RIGHT FOREARM  Final   Special Requests BOTTLES DRAWN AEROBIC AND ANAEROBIC [facility] EACH  Final   Culture  Setup Time   Final   Culture   Final    STREPTOCOCCUS PNEUMONIAE Performed at [facility]    Report Status 09/25/2015 FINAL  Final   Organism ID, Bacteria STREPTOCOCCUS PNEUMONIAE  Final      Susceptibility   Streptococcus pneumoniae - MIC*    ERYTHROMYCIN <=0.12 SENSITIVE Sensitive     LEVOFLOXACIN 1 SENSITIVE Sensitive     VANCOMYCIN 0.25 SENSITIVE Sensitive     PENICILLIN <=0.06 SENSITIVE Sensitive     CEFTRIAXONE <=0.12 SENSITIVE Sensitive     * STREPTOCOCCUS PNEUMONIAE  Culture, blood (routine x 2)     Status: None   Collection Time: 09/22/15  2:15 PM  Result Value Ref Range Status   Specimen Description BLOOD LEFT HAND  Final   Special Requests IN PEDIATRIC BOTTLE 2CC  Final   Culture   Final    NO GROWTH 5 DAYS Performed at [facility]    Report Status 09/27/2015 FINAL  Final  MRSA PCR Screening     Status: None   Collection Time: 09/22/15  4:36 PM  Result Value Ref Range Status   MRSA by PCR NEGATIVE NEGATIVE Final    Comment:        The GeneXpert MRSA Assay (FDA approved for NASAL specimens only), is one component of a comprehensive MRSA colonization surveillance program. It is not intended to diagnose MRSA infection nor to guide or monitor treatment for MRSA infections.   Urine culture      Status: None   Collection Time: 09/22/15  6:23 PM  Result Value Ref Range Status   Specimen Description URINE, RANDOM  Final   Special Requests Immunocompromised  Final   Culture   Final    2,000 COLONIES/mL INSIGNIFICANT GROWTH Performed at [facility]    Report Status 09/23/2015 FINAL  Final  Culture, blood (routine x 2)     Status: None (Preliminary result)   Collection Time: 09/25/15  1:15 PM  Result Value Ref Range Status   Specimen Description BLOOD LEFT ARM  Final   Special Requests BOTTLES DRAWN AEROBIC AND ANAEROBIC  10CC  Final   Culture   Final    NO GROWTH 3 DAYS Performed at [facility]    Report Status PENDING  Incomplete  Culture, blood (routine x 2)     Status: None (Preliminary result)   Collection Time: 09/25/15  1:15 PM  Result Value Ref Range Status   Specimen Description BLOOD LEFT ARM  Final   Special Requests BOTTLES DRAWN AEROBIC AND ANAEROBIC  10CC  Final   Culture   Final    NO GROWTH 3 DAYS Performed at [facility]    Report Status PENDING  Incomplete    .  ceFAZolin (ANCEF) IV  2 g Intravenous Q8H  . chlorproMAZINE  15 mg Oral TID  . dextromethorphan  30 mg Oral BID  . enoxaparin (LOVENOX) injection  40 mg Subcutaneous Q24H  . famotidine  20 mg Oral BID  . folic acid  1 mg Oral Daily  . magnesium oxide  400 mg Oral Daily  . multivitamin with minerals  1 tablet Oral Daily  . thiamine  100 mg Oral Daily

## 2015-09-29 NOTE — Care Management Important Message (Signed)
Important Message  Patient Details  Name: Johnny Mcknight MRN: [id number] Date of Birth: [birth year]   Medicare Important Message Given:  Yes    [name] 09/29/2015, 10:00 AMImportant Message  Patient Details  Name: Johnny Mcknight MRN: [id number] Date of Birth: March 04, 1956   Medicare Important Message Given:  Yes    [name] 09/29/2015, 10:00 AM

## 2015-09-29 NOTE — Progress Notes (Signed)
    [facility] for Infectious Disease    Date of Admission:  09/22/2015   Total days of antibiotics 8        Day 3 cefazolin           ID: Johnny Mcknight is a 60 y.o. male with streptococcal pneumonia with secondary bacteremia Principal Problem:   Acute respiratory failure with hypoxia (HCC) Active Problems:   GERD   Hypokalemia   Hypomagnesemia   Streptococcal pneumonia ([place])   Bacteremia due to Streptococcus pneumoniae   History of alcohol abuse   Anemia of chronic disease   Thrombocytopenia (HCC)    Subjective/interval hx: Afebrile. Underwent TEE which showed redundant anterior mitral leaflet chordae rather than a vegetation  Medications:  .  ceFAZolin (ANCEF) IV  2 g Intravenous Q8H  . chlorproMAZINE  15 mg Oral TID  . dextromethorphan  30 mg Oral BID  . enoxaparin (LOVENOX) injection  40 mg Subcutaneous Q24H  . famotidine  20 mg Oral BID  . folic acid  1 mg Oral Daily  . magnesium oxide  400 mg Oral Daily  . multivitamin with minerals  1 tablet Oral Daily  . thiamine  100 mg Oral Daily    Objective: Vital signs in last 24 hours: Temp:  [97.8 F (36.6 C)-98.6 F (37 C)] 97.8 F (36.6 C) (01/10 1240) Pulse Rate:  [86-108] 97 (01/10 1240) Resp:  [15-30] 18 (01/10 1240) BP: (131-165)/(72-99) 138/78 mmHg (01/10 1240) SpO2:  [94 %-100 %] 99 % (01/10 1240) Physical Exam  Constitutional: He is oriented to person, place, and time. He appears well-developed and well-nourished. No distress.  HENT:  Mouth/Throat: Oropharynx is clear and moist. No oropharyngeal exudate.  Cardiovascular: Normal rate, regular rhythm and normal heart sounds. Exam reveals no gallop and no friction rub.  No murmur heard.  Pulmonary/Chest: Effort normal and breath sounds normal. No respiratory distress. Rhonchi at base Lymphadenopathy:  He has no cervical adenopathy.  Neurological: He is alert and oriented to person, place, and time.  Skin: Skin is warm and dry. No rash noted. No erythema.    Psychiatric: He has a normal mood and affect. His behavior is normal.     Lab Results  Recent Labs  09/28/15 1138 09/29/15 0350  WBC 9.9  --   HGB 9.4*  --   HCT 27.7*  --   NA 140  --   K 3.5  --   CL 108  --   CO2 24  --   BUN 6  --   CREATININE 0.68 0.58*    Microbiology: 1/3 blood cx 1 of 2 sets with strep pneumo 1/6 blood cx ngtd   Assessment/Plan: 60yo M with streptococcal pneumonia with secondary bacteremia. TEE ruled out endocarditis. Currently on day 8 of 14. For discharge, can switch to cefuroxime 500mg  BID for 5 more days.  Will sign off.  Baxter Flattery [facility] for Infectious Diseases Cell: [phone] Pager: [phone]  09/29/2015, 2:15 PM

## 2015-09-29 NOTE — H&P (View-Only) (Signed)
[facility] for Infectious Disease  Total days of antibiotics 7        Day 2 cefazolin               Reason for Consult: possible strep pneumo NVE   Referring Physician: devine  Principal Problem:   Acute respiratory failure with hypoxia ([place]) Active Problems:   GERD   Hypokalemia   Hypomagnesemia   Streptococcal pneumonia ([place])   Bacteremia due to Streptococcus pneumoniae   History of alcohol abuse   Anemia of chronic disease   Thrombocytopenia (HCC)    HPI: Johnny Mcknight is a 60 y.o. male  with a PMH of asthma, HTN and alcohol dependence who was admitted on 1/3 who presents with 5 day history of diarrhea, emesis and cough.  He also reported having subjective fever, fatigue, weight loss, wheezing, occasional abdominal pain and weakness. On admit, he was found to have left shift with wbc of 6.7, CXR shows LUL infiltrate. Other labs of interests showed + strep pneumo antigen as well as 1 of 2 blood cx + for s. Pneumo. He underwent TTE that showed possible small mobile mass to anterior mv. He remains afebrile, repeat bloodcx on 1/6 is ngtd. He is slated for TEE. ID consultation sought for determining length of treatment.  He feels better than when he was admitted. He reports that he is only having 1 bm per day, formed. He denies any abdominal cramping. He did have episode of hiccups that our now improved. No jittering, sweating, heart racing from not having any alcohol.  Active Ambulatory Problems    Diagnosis Date Noted  . GERD 10/15/2007   Resolved Ambulatory Problems    Diagnosis Date Noted  . No Resolved Ambulatory Problems   No Additional Past Medical History     Allergies:  Allergies  Allergen Reactions  . Doxycycline     REACTION: sleepy  . Omeprazole-Sodium Bicarbonate     REACTION: diarrhea  . Propranolol     tired     MEDICATIONS: .  ceFAZolin (ANCEF) IV  2 g Intravenous Q8H  . chlorproMAZINE  15 mg Oral TID  . dextromethorphan  30 mg Oral BID  .  enoxaparin (LOVENOX) injection  40 mg Subcutaneous Q24H  . famotidine  20 mg Oral BID  . folic acid  1 mg Oral Daily  . magnesium oxide  400 mg Oral Daily  . multivitamin with minerals  1 tablet Oral Daily  . thiamine  100 mg Oral Daily    Social History  Substance Use Topics  . Smoking status: Former Research scientist (life sciences)  . Smokeless tobacco: Never Used  . Alcohol Use: 0.0 oz/week     Comment: gin 1/2 pint beer, stopped in 2011; 1-2 beers/d in 2013, pint of liquor a day on1/3/16    Family History  Problem Relation Age of Onset  . Hypertension Other   . Heart disease Father 74    MI  . Hypertension Father   . Hypertension Mother   . Diabetes Mother   . Diabetes Maternal Aunt   . Stroke Maternal Aunt   . Diabetes Maternal Grandmother      Review of Systems  Constitutional: Negative for fever, chills, diaphoresis, activity change, appetite change, fatigue and unexpected weight change.  HENT: Negative for congestion, sore throat, rhinorrhea, sneezing, trouble swallowing and sinus pressure.  Eyes: Negative for photophobia and visual disturbance.  Respiratory: positive for cough,but negative  chest tightness, shortness of breath, wheezing and stridor.  Cardiovascular: Negative for chest pain, palpitations and leg swelling.  Gastrointestinal: positive for hiccups. But Negative for nausea, vomiting, abdominal pain, diarrhea, constipation, blood in stool, abdominal distention and anal bleeding.  Genitourinary: Negative for dysuria, hematuria, flank pain and difficulty urinating.  Musculoskeletal: Negative for myalgias, back pain, joint swelling, arthralgias and gait problem.  Skin: Negative for color change, pallor, rash and wound.  Neurological: Negative for dizziness, tremors, weakness and light-headedness.  Hematological: Negative for adenopathy. Does not bruise/bleed easily.  Psychiatric/Behavioral: Negative for behavioral problems, confusion, sleep disturbance, dysphoric mood, decreased  concentration and agitation.     OBJECTIVE: Temp:  [97.5 F (36.4 C)-99.8 F (37.7 C)] 98.4 F (36.9 C) (01/09 0556) Pulse Rate:  [96-100] 100 (01/09 0556) Resp:  [18] 18 (01/09 0556) BP: (131-159)/(77-82) 159/82 mmHg (01/09 0556) SpO2:  [95 %-98 %] 95 % (01/09 0556) Physical Exam  Constitutional: He is oriented to person, place, and time. He appears well-developed and well-nourished. No distress.  HENT: pale conjunctiva Mouth/Throat: Oropharynx is clear and moist. No oropharyngeal exudate. Poor dentition Cardiovascular: Normal rate, regular rhythm and normal heart sounds. Exam reveals no gallop and no friction rub.  No murmur heard.  Pulmonary/Chest: Effort normal and breath sounds normal. No respiratory distress. He has no wheezes.  Abdominal: Soft. Bowel sounds are normal. He exhibits no distension. There is no tenderness.  Lymphadenopathy:  He has no cervical adenopathy.  Neurological: He is alert and oriented to person, place, and time.  Skin: Skin is warm and dry. No rash noted. No erythema.  Psychiatric: He has a normal mood and affect. His behavior is normal.   LABS: Lab Results  Component Value Date   WBC 4.9 09/23/2015   HGB 10.4* 09/23/2015   HCT 30.1* 09/23/2015   MCV 101.7* 09/23/2015   PLT 148* 09/23/2015   BMET    Component Value Date/Time   NA 137 09/26/2015 0635   K 3.7 09/26/2015 0635   CL 108 09/26/2015 0635   CO2 21* 09/26/2015 0635   GLUCOSE 89 09/26/2015 0635   GLUCOSE 113* 09/05/2006 1720   BUN 10 09/26/2015 0635   CREATININE 0.56* 09/26/2015 0635   CREATININE 0.96 06/09/2014 1148   CALCIUM 7.8* 09/26/2015 0635   GFRNONAA >60 09/26/2015 0635   GFRNONAA 88 06/09/2014 1148   GFRAA >60 09/26/2015 0635   GFRAA >89 06/09/2014 1148     MICRO: 1/3 blood cx 1 of 2 + s.pneumo 1/6 blood cx ngtd IMAGING: 1/3 cxr per my read shows RUL infiltrate 1/6 echo + mobile mass, small on MV  Assessment/Plan:  60 yo M admitted for malaise, fever,  diarrhea, cough. Found to have strep pneumo pneumonia with associated bacteremia. 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   - await for TEE results to determine length of therapy ( 14 d vs. 6 wk), if TEE is positive, then can place central line for prolonged antibiotics. For discharge, can switch to ceftriaxone 2gm iv daily. If TEE is negative, then we can consider treating with orals and not need picc line  - for health screening will check for hep C  - alcohol dependence = continue with ciwa protocol. Appears not having any symptoms of withdrawal  Thank you for consultation [facility] for Infectious Diseases [phone]

## 2015-09-30 ENCOUNTER — Encounter (HOSPITAL_COMMUNITY): Payer: Self-pay | Admitting: Cardiology

## 2015-09-30 DIAGNOSIS — F1010 Alcohol abuse, uncomplicated: Secondary | ICD-10-CM

## 2015-09-30 DIAGNOSIS — J189 Pneumonia, unspecified organism: Secondary | ICD-10-CM | POA: Insufficient documentation

## 2015-09-30 DIAGNOSIS — B953 Streptococcus pneumoniae as the cause of diseases classified elsewhere: Secondary | ICD-10-CM

## 2015-09-30 LAB — COMPREHENSIVE METABOLIC PANEL
ALT: 38 U/L (ref 17–63)
AST: 128 U/L — ABNORMAL HIGH (ref 15–41)
Albumin: 2.3 g/dL — ABNORMAL LOW (ref 3.5–5.0)
Alkaline Phosphatase: 296 U/L — ABNORMAL HIGH (ref 38–126)
Anion gap: 8 (ref 5–15)
BUN: 5 mg/dL — ABNORMAL LOW (ref 6–20)
CHLORIDE: 107 mmol/L (ref 101–111)
CO2: 25 mmol/L (ref 22–32)
CREATININE: 0.55 mg/dL — AB (ref 0.61–1.24)
Calcium: 8.1 mg/dL — ABNORMAL LOW (ref 8.9–10.3)
GFR calc non Af Amer: >60 mL/min (ref >60)
Glucose, Bld: 122 mg/dL — ABNORMAL HIGH (ref 65–99)
Potassium: 3.5 mmol/L (ref 3.5–5.1)
SODIUM: 140 mmol/L (ref 135–145)
Total Bilirubin: 1.5 mg/dL — ABNORMAL HIGH (ref 0.3–1.2)
Total Protein: 6.9 g/dL (ref 6.5–8.1)

## 2015-09-30 LAB — CULTURE, BLOOD (ROUTINE X 2)
Culture: NO GROWTH
Culture: NO GROWTH

## 2015-09-30 LAB — CBC
HCT: 26.6 % — ABNORMAL LOW (ref 39.0–52.0)
Hemoglobin: 9.3 g/dL — ABNORMAL LOW (ref 13.0–17.0)
MCH: 35.9 pg — AB (ref 26.0–34.0)
MCHC: 35 g/dL (ref 30.0–36.0)
MCV: 102.7 fL — ABNORMAL HIGH (ref 78.0–100.0)
PLATELETS: 384 10*3/uL (ref 150–400)
RBC: 2.59 MIL/uL — AB (ref 4.22–5.81)
RDW: 17.2 % — ABNORMAL HIGH (ref 11.5–15.5)
WBC: 15.1 10*3/uL — AB (ref 4.0–10.5)

## 2015-09-30 LAB — HEPATITIS C ANTIBODY

## 2015-09-30 MED ORDER — CEFUROXIME AXETIL 500 MG PO TABS
500.0000 mg | ORAL_TABLET | Freq: Two times a day (BID) | ORAL | Status: DC
  Administered 2015-09-30: 500 mg via ORAL
  Filled 2015-09-30 (×2): qty 1

## 2015-09-30 MED ORDER — FOLIC ACID 1 MG PO TABS: 1.0000 mg | ORAL_TABLET | Freq: Every day | ORAL | Status: DC

## 2015-09-30 MED ORDER — LEVOFLOXACIN 500 MG PO TABS
500.0000 mg | ORAL_TABLET | Freq: Every day | ORAL | Status: DC
  Administered 2015-09-30: 500 mg via ORAL
  Filled 2015-09-30: qty 1

## 2015-09-30 MED ORDER — SODIUM CHLORIDE 0.9 % IV BOLUS (SEPSIS)
500.0000 mL | Freq: Once | INTRAVENOUS | Status: AC
  Administered 2015-09-30: 500 mL via INTRAVENOUS

## 2015-09-30 MED ORDER — CHLORPROMAZINE HCL 10 MG PO TABS: 15.0000 mg | ORAL_TABLET | Freq: Three times a day (TID) | ORAL | Status: DC | PRN

## 2015-09-30 MED ORDER — THIAMINE HCL 100 MG PO TABS: 100.0000 mg | ORAL_TABLET | Freq: Every day | ORAL | Status: DC

## 2015-09-30 MED ORDER — ADULT MULTIVITAMIN W/MINERALS CH: 1.0000 | ORAL_TABLET | Freq: Every day | ORAL | Status: DC

## 2015-09-30 MED ORDER — MAGNESIUM OXIDE 400 (241.3 MG) MG PO TABS: 400.0000 mg | ORAL_TABLET | Freq: Every day | ORAL | Status: DC

## 2015-09-30 NOTE — Progress Notes (Signed)
Pharmacy Antibiotic Follow-up Note  Johnny Mcknight is a 60 y.o. year-old male admitted on 09/22/2015.  The patient is currently on day 4 of Ancef for strep pneumo pneumonia with associated bacteremia.    Assessment/Plan: TEE performed on 1/10 does not appear to show vegetation per notes.  Repeat blood cultures remain negative.  Afebrile with WBC WNL.  Continue Ancef 2g IV q8h.  F/u duration of therapy and transition to oral antibiotics if ok with ID.    Temp (24hrs), Avg:98.2 F (36.8 C), Min:97.8 F (36.6 C), Max:98.6 F (37 C)   Recent Labs Lab 09/28/15 1138  WBC 9.9    Recent Labs Lab 09/25/15 0520 09/26/15 0635 09/28/15 1138 09/29/15 0350  CREATININE 0.58* 0.56* 0.68 0.58*   Estimated Creatinine Clearance: 111.8 mL/min (by C-G formula based on Cr of 0.58).    Allergies  Allergen Reactions  . Doxycycline     REACTION: sleepy  . Omeprazole-Sodium Bicarbonate     REACTION: diarrhea  . Propranolol     tired    Antimicrobials this admission: 1/8 Ancef >>  1/3 Rocephin>>1/3 1/3 Zithromax>>1/3  1/4 Levaquin>>1/8  Levels/dose changes this admission: N/A  Microbiology results: 1/6  blood x 2: NGTD 1/3  blood x 1: NGTD 1/3  blood x 1: Strep pneumoniae: pan sensitive 1/3 urine: insig growth 1/3 strep UA: positive  Thank you for allowing pharmacy to be a part of this patient's care.  Ralene Bathe, PharmD, BCPS 09/30/2015, 7:44 AM  Pager: [phone]

## 2015-09-30 NOTE — Progress Notes (Signed)
Patient ID: Johnny Wentzell, male   DOB: [birth year], 60 y.o.   MRN: [id number] [facility] PROGRESS NOTE  Aizik Reh [id number] DOB: 1955-09-29 DOA: 09/22/2015  PCP: Walker Kehr, MD   Brief narrative:    60 y.o. male with past medical history of asthma, hypertension, history of alcohol abuse who presented to [facility] long hospital with reports of ongoing nausea, vomiting, diarrhea for past 5 days prior to this admission. He has been drinking vodka on daily basis. No reports of blood in the stool or hematemesis. Patient also reported subjective fever and wheezing.   On admission chest x-ray demonstrated airspace consolidation consistent with pneumonia in the right lung. He was given azithromycin and Rocephin in ED and then was started on Levaquin.   Strep pneumonia antigen was positive on this admission.    Transferred to telemetry 09/23/2015.  Hospital course complicated with finding of Streptococcus pneumonia on one of the blood cultures collected on the admission 09/22/2015.   Assessment/Plan:    Acute respiratory failure with hypoxemia / aspiration pneumonia right upper lung lobe / sepsis secondary to Streptococcus pneumonia / streptococcus bacteremia - Sepsis criteria met on the admission with tachycardia, tachypnea, fever, lactic acidosis. CXR on admission showed right upper lung lobe pneumonia. - Strep pneumonia antigen is positive. - Blood culture on admission grew strep pneumonia in 1 of the sets. Repeat blood cultures negative so far. - Influenza is negative; HIV is negative.  - Continue DuoNeb nebulizer twice daily - TEE negative for endocarditis. Infectious disease recommended Ceftin. After his first dose of Ceftin patient started complaining of palpitations and headache. We'll discuss with ID. Change back to Levaquin for now. EKG ordered. Patient denies any chest pain. His vital signs are all stable.   History of alcohol abuse / alcohol withdrawal without complications -  Alcohol level was within normal limits at the time of the admission - No withdrawals - Continue multivitamin, folic acid and thiamine - Patient counseled.  GERD - We will continue Pepcid  Hypokalemia / hypomagnesemia - Secondary to alcohol abuse, dietary depletion - Supplemented and potassium WNL - Magnesium supplementation to continue orally.   Anemia of chronic disease / thrombocytopenia - Secondary to bone marrow suppression from alcohol abuse - Hemoglobin is stable at 10.4, platelets are 148  Hiccups - Improved with Thorazine   DVT Prophylaxis  - Lovenox subQ in hospital   Code Status: Full.  Family Communication:  plan of care discussed with the patient and his wife Disposition Plan: Hold off on discharge today. Anticipate discharge tomorrow.  IV access:  Peripheral IV  Procedures and diagnostic studies:    Dg Chest 2 View 09/22/2015  Airspace consolidation consistent with pneumonia throughout the axillary subsegment and posterior segment of the right upper lobe. Lungs elsewhere clear. Followup PA and lateral chest radiographs recommended in 3-4 weeks following trial of antibiotic therapy to ensure resolution and exclude underlying malignancy.   Medical Consultants:  Infectious disease  Other Consultants:  Physical therapy   IAnti-Infectives:   Was given ceftriaxone and azithromycin at admission. Transitioned to Levaquin. Then transitioned to Ancef.   Bonnielee Haff, MD  [facility] Pager [phone]  Time spent in minutes: 25 minutes  If 7PM-7AM, please contact night-coverage [email or web address] Password [email or web address] 09/30/2015, 2:58 PM   LOS: 8 days    Subjective: Patient denies any complaints. Cough is improved. No shortness of breath. No nausea or vomiting.   Objective: Filed Vitals:   09/29/15 1240 09/29/15 2120 09/30/15 0649 09/30/15 1420  BP: 138/78  148/83 149/87 132/76  Pulse: 97 95 94 100  Temp: 97.8 F (36.6 C) 98.2 F (36.8 C) 98 F (36.7 C) 98  F (36.7 C)  TempSrc: Oral Oral Oral Oral  Resp: '18 20 20 20  ' Height:      Weight:      SpO2: 99% 98% 95% 98%    Intake/Output Summary (Last 24 hours) at 09/30/15 1458 Last data filed at 09/30/15 1000  Gross per 24 hour  Intake    240 ml  Output    950 ml  Net   -710 ml    Exam:   General:  Awake and alert. No distress  Cardiovascular: S1, S2 is normal, regular. No S3, S4. No rubs, murmurs, or bruit  Respiratory: Good air entry bilaterally. No wheezing, rhonchi. No crackles.  Abdomen: Soft, nontender, nondistended. Bowel sounds are present. No masses or organomegaly.  Neuro: No focal deficits   Data Reviewed: Basic Metabolic Panel:  Recent Labs Lab 09/25/15 0520 09/26/15 0635 09/27/15 0352 09/28/15 1138 09/29/15 0350 09/30/15 0845  NA 138 137  --  140  --  140  K 3.5 3.7  --  3.5  --  3.5  CL 107 108  --  108  --  107  CO2 22 21*  --  24  --  25  GLUCOSE 95 89  --  90  --  122*  BUN 13 10  --  6  --  5*  CREATININE 0.58* 0.56*  --  0.68 0.58* 0.55*  CALCIUM 8.0* 7.8*  --  8.4*  --  8.1*  MG 1.6* 1.6* 1.7  --   --   --    Liver Function Tests:  Recent Labs Lab 09/30/15 0845  AST 128*  ALT 38  ALKPHOS 296*  BILITOT 1.5*  PROT 6.9  ALBUMIN 2.3*    CBC:  Recent Labs Lab 09/28/15 1138 09/30/15 0845  WBC 9.9 15.1*  HGB 9.4* 9.3*  HCT 27.7* 26.6*  MCV 102.6* 102.7*  PLT 342 384     Culture, blood (routine x 2)     Status: None   Collection Time: 09/22/15  1:42 PM  Result Value Ref Range Status   Specimen Description BLOOD BLOOD RIGHT FOREARM  Final   Culture  Setup Time   Final   Culture   Final    STREPTOCOCCUS PNEUMONIAE Performed at [facility]    Report Status 09/25/2015 FINAL  Final   Organism ID, Bacteria STREPTOCOCCUS PNEUMONIAE  Final      Susceptibility   Streptococcus pneumoniae - MIC*    ERYTHROMYCIN <=0.12 SENSITIVE Sensitive     LEVOFLOXACIN 1 SENSITIVE Sensitive     VANCOMYCIN 0.25 SENSITIVE Sensitive      PENICILLIN <=0.06 SENSITIVE Sensitive     CEFTRIAXONE <=0.12 SENSITIVE Sensitive     * STREPTOCOCCUS PNEUMONIAE  Culture, blood (routine x 2)     Status: None (Preliminary result)   Collection Time: 09/22/15  2:15 PM  Result Value Ref Range Status   Specimen Description BLOOD LEFT HAND  Final   Special Requests IN PEDIATRIC BOTTLE 2CC  Final   Culture   Final    NO GROWTH 2 DAYS    Report Status PENDING  Incomplete  MRSA PCR Screening     Status: None   Collection Time: 09/22/15  4:36 PM  Result Value Ref Range Status   MRSA by PCR NEGATIVE NEGATIVE Final  Urine culture     Status: None  Collection Time: 09/22/15  6:23 PM  Result Value Ref Range Status   Specimen Description URINE, RANDOM  Final   Special Requests Immunocompromised  Final   Culture   Final    2,000 COLONIES/mL INSIGNIFICANT GROWTH Performed at [facility]    Report Status 09/23/2015 FINAL  Final     Scheduled Meds: . chlorproMAZINE  15 mg Oral TID  . dextromethorphan  30 mg Oral BID  . enoxaparin (LOVENOX) injection  40 mg Subcutaneous Q24H  . famotidine  20 mg Oral BID  . folic acid  1 mg Oral Daily  . magnesium oxide  400 mg Oral Daily  . multivitamin with minerals  1 tablet Oral Daily  . thiamine  100 mg Oral Daily   Continuous Infusions: . 0.9 % NaCl with KCl 40 mEq / L 10 mL/hr (09/29/15 1252)    Bonnielee Haff, MD  [facility] Pager [phone]  Time spent in minutes: 25 minutes  If 7PM-7AM, please contact night-coverage [email or web address] Password [email or web address] 09/30/2015, 2:58 PM  LOS: 8 days

## 2015-09-30 NOTE — Progress Notes (Signed)
[facility]  Patient Status: New pt for [facility] this admissoin  [facility] is providing the following services: HHRN and Home infusion pharmacy if pt is determined to need home IV ABX.  [facility] hospital team will follow Mr. Seufert and Dr. Alvira Philips recommendations regarding ABX to support home needs as ordered.  If patient discharges after hours, please call [phone].   Larry Sierras 09/30/2015, 6:53 AM

## 2015-09-30 NOTE — Progress Notes (Signed)
Physical Therapy Treatment Patient Details Name: Brylen Collens MRN: [id number] DOB: Apr 16, 1956 Today's Date: 09/30/2015    History of Present Illness 60 yo male admitted with aspiration pna, acute resp failure, sepsis, diarrhea. Hx of ETOH abuse, non compliance, asthma, HTN.     PT Comments    Pt moving well with ambulation at S to MOD I level with no AD, with pt reporting his knee is not bothering him today.  Initiated stair training with S to MIN/guard and 1 rail.  Follow Up Recommendations  No PT follow up;Supervision - Intermittent     Equipment Recommendations  None recommended by PT    Recommendations for Other Services       Precautions / Restrictions Restrictions Weight Bearing Restrictions: No    Mobility  Bed Mobility Overal bed mobility: Modified Independent             General bed mobility comments: HOB elevated, and states he sleeps in chair at home due to sinuses  Transfers Overall transfer level: Modified independent Equipment used: None Transfers: Sit to/from Stand Sit to Stand: Modified independent (Device/Increase time)            Ambulation/Gait Ambulation/Gait assistance: Supervision;Modified independent (Device/Increase time) Ambulation Distance (Feet): 800 Feet Assistive device: None Gait Pattern/deviations: WFL(Within Functional Limits);Step-through pattern Gait velocity: WNL   General Gait Details: pt ambulated well today stating his knee was not bothering him. pt able to turn head with gait with no LOB, take steps backwards.   Stairs Stairs: Yes Stairs assistance: Min guard Stair Management: One rail Left;Alternating pattern;Forwards Number of Stairs: 8 General stair comments: S with ascent and min/guard with descent due to pt having concern about his knees although states his knees feel pretty good today.  Step to pattern on first step, but step through the remaining ones.  Wheelchair Mobility    Modified Rankin (Stroke  Patients Only)       Balance Overall balance assessment: No apparent balance deficits (not formally assessed)           Standing balance-Leahy Scale: Good Standing balance comment: Pt able to move about in room with reaching out of BOS, gait with head turns, backwards with no LOB                    Cognition Arousal/Alertness: Awake/alert Behavior During Therapy: WFL for tasks assessed/performed Overall Cognitive Status: Within Functional Limits for tasks assessed                      Exercises      General Comments        Pertinent Vitals/Pain Pain Assessment: No/denies pain    Home Living                      Prior Function            PT Goals (current goals can now be found in the care plan section) Acute Rehab PT Goals Patient Stated Goal: home soon PT Goal Formulation: With patient Time For Goal Achievement: 10/08/15 Potential to Achieve Goals: Good Progress towards PT goals: Progressing toward goals    Frequency  Min 3X/week    PT Plan Discharge plan needs to be updated    Co-evaluation             End of Session Equipment Utilized During Treatment: Gait belt Activity Tolerance: Patient tolerated treatment well Patient left: in chair;with call bell/phone within reach  Time: [id number] PT Time Calculation (min) (ACUTE ONLY): 18 min  Charges:  $Gait Training: 8-22 mins                    G Codes:      Josey Forcier LUBECK 09/30/2015, 12:02 PM

## 2015-10-01 ENCOUNTER — Encounter: Payer: Self-pay | Admitting: Urgent Care

## 2015-10-01 LAB — CBC
HEMATOCRIT: 24.7 % — AB (ref 39.0–52.0)
HEMOGLOBIN: 8.4 g/dL — AB (ref 13.0–17.0)
MCH: 35.1 pg — ABNORMAL HIGH (ref 26.0–34.0)
MCHC: 34 g/dL (ref 30.0–36.0)
MCV: 103.3 fL — ABNORMAL HIGH (ref 78.0–100.0)
Platelets: 408 10*3/uL — ABNORMAL HIGH (ref 150–400)
RBC: 2.39 MIL/uL — ABNORMAL LOW (ref 4.22–5.81)
RDW: 17.4 % — AB (ref 11.5–15.5)
WBC: 15.7 10*3/uL — AB (ref 4.0–10.5)

## 2015-10-01 LAB — COMPREHENSIVE METABOLIC PANEL
ALT: 34 U/L (ref 17–63)
ANION GAP: 7 (ref 5–15)
AST: 110 U/L — ABNORMAL HIGH (ref 15–41)
Albumin: 2.1 g/dL — ABNORMAL LOW (ref 3.5–5.0)
Alkaline Phosphatase: 247 U/L — ABNORMAL HIGH (ref 38–126)
BILIRUBIN TOTAL: 1.2 mg/dL (ref 0.3–1.2)
BUN: 5 mg/dL — ABNORMAL LOW (ref 6–20)
CO2: 24 mmol/L (ref 22–32)
Calcium: 8.3 mg/dL — ABNORMAL LOW (ref 8.9–10.3)
Chloride: 110 mmol/L (ref 101–111)
Creatinine, Ser: 0.61 mg/dL (ref 0.61–1.24)
GFR calc Af Amer: >60 mL/min (ref >60)
Glucose, Bld: 115 mg/dL — ABNORMAL HIGH (ref 65–99)
POTASSIUM: 3.6 mmol/L (ref 3.5–5.1)
Sodium: 141 mmol/L (ref 135–145)
TOTAL PROTEIN: 6.3 g/dL — AB (ref 6.5–8.1)

## 2015-10-01 MED ORDER — LEVOFLOXACIN 500 MG PO TABS: 500.0000 mg | ORAL_TABLET | Freq: Every day | ORAL | Status: DC

## 2015-10-01 MED ORDER — LOPERAMIDE HCL 2 MG PO TABS: 2.0000 mg | ORAL_TABLET | Freq: Four times a day (QID) | ORAL | Status: DC | PRN

## 2015-10-01 MED ORDER — LOPERAMIDE HCL 2 MG PO CAPS
2.0000 mg | ORAL_CAPSULE | Freq: Once | ORAL | Status: AC
  Administered 2015-10-01: 2 mg via ORAL
  Filled 2015-10-01: qty 1

## 2015-10-01 MED ORDER — SACCHAROMYCES BOULARDII 250 MG PO CAPS: 250.0000 mg | ORAL_CAPSULE | Freq: Two times a day (BID) | ORAL | Status: DC

## 2015-10-01 NOTE — Discharge Instructions (Signed)

## 2015-10-01 NOTE — Discharge Summary (Signed)
[facility]  Physician Discharge Summary   Patient ID: Johnny Mcknight MRN: [id number] DOB/AGE: Aug 14, 1956 60 y.o.  Admit date: 09/22/2015 Discharge date: 10/01/2015  PCP: Walker Kehr, MD  DISCHARGE DIAGNOSES:  Principal Problem:   Acute respiratory failure with hypoxia ([place]) Active Problems:   GERD   Hypokalemia   Hypomagnesemia   Streptococcal pneumonia ([place])   Bacteremia due to Streptococcus pneumoniae   History of alcohol abuse   Anemia of chronic disease   Thrombocytopenia (HCC)   CAP (community acquired pneumonia)   RECOMMENDATIONS FOR OUTPATIENT FOLLOW UP: 1. Close follow-up with PCP tomorrow.  2. Patient extensively counseled regarding his alcohol intake. 3. Please repeat CBC as outpatient to monitor WBC and hemoglobin 4. Monitor blood pressure as outpatient   DISCHARGE CONDITION: fair  Diet recommendation: Low-sodium  Filed Weights   09/22/15 1643  Weight: 78 kg (171 lb 15.3 oz)    INITIAL HISTORY: 60 y.o. male with past medical history of asthma, hypertension, history of alcohol abuse who presented to [facility] hospital with reports of ongoing nausea, vomiting, diarrhea for past 5 days prior to this admission. He has been drinking vodka on daily basis. No reports of blood in the stool or hematemesis. Patient also reported subjective fever and wheezing. On admission chest x-ray demonstrated airspace consolidation consistent with pneumonia in the right lung. He was given azithromycin and Rocephin in ED and then was started on Levaquin. Strep pneumonia antigen was positive on this admission.   Consultations:  ID  Procedures:  None  HOSPITAL COURSE:   Acute respiratory failure with hypoxemia / aspiration pneumonia right upper lung lobe / sepsis secondary to Streptococcus pneumonia / streptococcus bacteremia Sepsis criteria met on the admission with tachycardia, tachypnea, fever, lactic acidosis. CXR on admission showed right upper lung lobe  pneumonia. Strep pneumonia antigen is positive. Blood culture on admission grew strep pneumonia in 1 of the sets. Repeat blood cultures negative so far. Influenza is negative; HIV is negative. Patient was initially given ceftriaxone and azithromycin. On admission, he was changed over to [place]. Due to positive blood cultures. He was transitioned to Ancef. This was changed initially to oral Ceftin which patient did not tolerate due to nausea and palpitations. Hence, he was changed back to oral Levaquin. TEE negative for endocarditis. Elevated WBC noted. He is afebrile. Can be monitored as outpatient.  History of alcohol abuse / alcohol withdrawal without complications Alcohol level was within normal limits at the time of the admission. No withdrawals during this hospitalization. Patient was extensively counseled.  GERD Patient given Pepcid during this hospitalization.   Hypokalemia / hypomagnesemia Secondary to alcohol abuse, dietary depletion. Supplemented and potassium WNL. Magnesium supplementation to continue orally.   Anemia of chronic disease / thrombocytopenia Secondary to bone marrow suppression from alcohol abuse. Some drop in hemoglobin due to dilution. No overt bleeding. Repeat as outpatient.  Hiccups Improved with Thorazine   Overall improved. Feels much better. Did have one episode of loose stool this morning. He will be given a dose of Imodium prior to discharge. His abdomen is benign. He denies any nausea, vomiting. Okay for discharge today.   PERTINENT LABS:  The results of significant diagnostics from this hospitalization (including imaging, microbiology, ancillary and laboratory) are listed below for reference.    Microbiology: Recent Results (from the past 240 hour(s))  Culture, blood (routine x 2)     Status: None   Collection Time: 09/22/15  1:42 PM  Result Value Ref Range Status   Specimen  Description BLOOD BLOOD RIGHT FOREARM  Final   Special Requests BOTTLES  DRAWN AEROBIC AND ANAEROBIC 5CC EACH  Final   Culture  Setup Time   Final    GRAM POSITIVE COCCI IN CHAINS IN BOTH AEROBIC AND ANAEROBIC BOTTLES CRITICAL RESULT CALLED TO, READ BACK BY AND VERIFIED WITH: J SUGGS '@[id number]'$  09/23/15 MKELLY    Culture   Final    STREPTOCOCCUS PNEUMONIAE Performed at [facility]    Report Status 09/25/2015 FINAL  Final   Organism ID, Bacteria STREPTOCOCCUS PNEUMONIAE  Final      Susceptibility   Streptococcus pneumoniae - MIC*    ERYTHROMYCIN <=0.12 SENSITIVE Sensitive     LEVOFLOXACIN 1 SENSITIVE Sensitive     VANCOMYCIN 0.25 SENSITIVE Sensitive     PENICILLIN <=0.06 SENSITIVE Sensitive     CEFTRIAXONE <=0.12 SENSITIVE Sensitive     * STREPTOCOCCUS PNEUMONIAE  Culture, blood (routine x 2)     Status: None   Collection Time: 09/22/15  2:15 PM  Result Value Ref Range Status   Specimen Description BLOOD LEFT HAND  Final   Special Requests IN PEDIATRIC BOTTLE [place]  Final   Culture   Final    NO GROWTH 5 DAYS Performed at [facility]    Report Status 09/27/2015 FINAL  Final  MRSA PCR Screening     Status: None   Collection Time: 09/22/15  4:36 PM  Result Value Ref Range Status   MRSA by PCR NEGATIVE NEGATIVE Final    Comment:        The GeneXpert MRSA Assay (FDA approved for NASAL specimens only), is one component of a comprehensive MRSA colonization surveillance program. It is not intended to diagnose MRSA infection nor to guide or monitor treatment for MRSA infections.   Urine culture     Status: None   Collection Time: 09/22/15  6:23 PM  Result Value Ref Range Status   Specimen Description URINE, RANDOM  Final   Special Requests Immunocompromised  Final   Culture   Final    2,000 COLONIES/mL INSIGNIFICANT GROWTH Performed at [facility]    Report Status 09/23/2015 FINAL  Final  Culture, blood (routine x 2)     Status: None   Collection Time: 09/25/15  1:15 PM  Result Value Ref Range Status   Specimen Description  BLOOD LEFT ARM  Final   Special Requests BOTTLES DRAWN AEROBIC AND ANAEROBIC  10CC  Final   Culture   Final    NO GROWTH 5 DAYS Performed at [facility]    Report Status 09/30/2015 FINAL  Final  Culture, blood (routine x 2)     Status: None   Collection Time: 09/25/15  1:15 PM  Result Value Ref Range Status   Specimen Description BLOOD LEFT ARM  Final   Special Requests BOTTLES DRAWN AEROBIC AND ANAEROBIC  10CC  Final   Culture   Final    NO GROWTH 5 DAYS Performed at [facility]    Report Status 09/30/2015 FINAL  Final     Labs: Basic Metabolic Panel:  Recent Labs Lab 09/25/15 0520 09/26/15 0635 09/27/15 0352 09/28/15 1138 09/29/15 0350 09/30/15 0845 10/01/15 0402  NA 138 137  --  140  --  140 141  K 3.5 3.7  --  3.5  --  3.5 3.6  CL 107 108  --  108  --  107 110  CO2 22 21*  --  24  --  25 24  GLUCOSE 95 89  --  90  --  122* 115*  BUN 13 10  --  6  --  5* 5*  CREATININE 0.58* 0.56*  --  0.68 0.58* 0.55* 0.61  CALCIUM 8.0* 7.8*  --  8.4*  --  8.1* 8.3*  MG 1.6* 1.6* 1.7  --   --   --   --    Liver Function Tests:  Recent Labs Lab 09/30/15 0845 10/01/15 0402  AST 128* 110*  ALT 38 34  ALKPHOS 296* 247*  BILITOT 1.5* 1.2  PROT 6.9 6.3*  ALBUMIN 2.3* 2.1*   CBC:  Recent Labs Lab 09/28/15 1138 09/30/15 0845 10/01/15 0402  WBC 9.9 15.1* 15.7*  HGB 9.4* 9.3* 8.4*  HCT 27.7* 26.6* 24.7*  MCV 102.6* 102.7* 103.3*  PLT 342 384 408*    IMAGING STUDIES Dg Chest 2 View  09/22/2015  CLINICAL DATA:  Shortness of breath and right-sided chest pain EXAM: CHEST  2 VIEW COMPARISON:  September 06, 2009 FINDINGS: There is airspace consolidation throughout the posterior segment of the right upper lobe. There is also airspace disease in the axillary subsegment of the right upper lobe. Lungs elsewhere clear. Heart size and pulmonary vascularity are normal. No adenopathy. No bone lesions. IMPRESSION: Airspace consolidation consistent with pneumonia  throughout the axillary subsegment and posterior segment of the right upper lobe. Lungs elsewhere clear. Followup PA and lateral chest radiographs recommended in 3-4 weeks following trial of antibiotic therapy to ensure resolution and exclude underlying malignancy. Electronically Signed   By: Lowella Grip III M.D.   On: 09/22/2015 12:25    DISCHARGE EXAMINATION: Filed Vitals:   09/30/15 1420 09/30/15 2120 10/01/15 0500 10/01/15 0800  BP: 132/76 134/74 153/80   Pulse: 100 93 99   Temp: 98 F (36.7 C) 98.4 F (36.9 C) 99.8 F (37.7 C) 98 F (36.7 C)  TempSrc: Oral Oral Oral Oral  Resp: _0 Height:      Weight:      SpO2: 98% 97% 97%    General appearance: alert, cooperative, appears stated age and no distress Resp: Much improved air entry bilaterally. No wheezing, rales or rhonchi. Cardio: regular rate and rhythm, S1, S2 normal, no murmur, click, rub or gallop GI: soft, non-tender; bowel sounds normal; no masses,  no organomegaly Extremities: extremities normal, atraumatic, no cyanosis or edema   DISPOSITION: Home with wife  Discharge Instructions    Call MD for:  difficulty breathing, headache or visual disturbances    Complete by:  As directed      Call MD for:  extreme fatigue    Complete by:  As directed      Call MD for:  hives    Complete by:  As directed      Call MD for:  persistant dizziness or light-headedness    Complete by:  As directed      Call MD for:  persistant nausea and vomiting    Complete by:  As directed      Call MD for:  severe uncontrolled pain    Complete by:  As directed      Call MD for:  temperature >100.4    Complete by:  As directed      Diet - low sodium heart healthy    Complete by:  As directed      Discharge instructions    Complete by:  As directed   Please see your PCP tomorrow as scheduled. Please  mention to him if diarrhea gets worse or if you develop high fevers or severe abdominal pain. Take your medications as  prescribed. Please stop drinking alcohol as it predisposes you to several medical problems.  You were cared for by a hospitalist during your hospital stay. If you have any questions about your discharge medications or the care you received while you were in the hospital after you are discharged, you can call the unit and asked to speak with the hospitalist on call if the hospitalist that took care of you is not available. Once you are discharged, your primary care physician will handle any further medical issues. Please note that NO REFILLS for any discharge medications will be authorized once you are discharged, as it is imperative that you return to your primary care physician (or establish a relationship with a primary care physician if you do not have one) for your aftercare needs so that they can reassess your need for medications and monitor your lab values. If you do not have a primary care physician, you can call [phone] for a physician referral.     Increase activity slowly    Complete by:  As directed            ALLERGIES:  Allergies  Allergen Reactions  . Doxycycline     REACTION: sleepy  . Omeprazole-Sodium Bicarbonate     REACTION: diarrhea  . Propranolol     tired      Discharge Medication List as of 10/01/2015  9:28 AM    START taking these medications   Details  chlorproMAZINE (THORAZINE) 10 MG tablet Take 1.5 tablets (15 mg total) by mouth 3 (three) times daily as needed for hiccoughs., Starting 09/30/2015, Until Discontinued, Normal    folic acid (FOLVITE) 1 MG tablet Take 1 tablet (1 mg total) by mouth daily., Starting 09/30/2015, Until Discontinued, Normal    levofloxacin (LEVAQUIN) 500 MG tablet Take 1 tablet (500 mg total) by mouth daily. For 5 more days starting at [place], Starting 10/01/2015, Until Discontinued, Normal    loperamide (IMODIUM A-D) 2 MG tablet Take 1 tablet (2 mg total) by mouth 4 (four) times daily as needed for diarrhea or loose stools.,  Starting 10/01/2015, Until Discontinued, Normal    magnesium oxide (MAG-OX) 400 (241.3 Mg) MG tablet Take 1 tablet (400 mg total) by mouth daily., Starting 09/30/2015, Until Discontinued, Normal    Multiple Vitamin (MULTIVITAMIN WITH MINERALS) TABS tablet Take 1 tablet by mouth daily., Starting 09/30/2015, Until Discontinued, Normal    saccharomyces boulardii (FLORASTOR) 250 MG capsule Take 1 capsule (250 mg total) by mouth 2 (two) times daily., Starting 10/01/2015, Until Discontinued, Normal    thiamine 100 MG tablet Take 1 tablet (100 mg total) by mouth daily., Starting 09/30/2015, Until Discontinued, Normal      CONTINUE these medications which have NOT CHANGED   Details  baclofen (LIORESAL) 10 MG tablet Take 1 tablet (10 mg total) by mouth at bedtime., Starting 06/09/2014, Until Discontinued, Print      STOP taking these medications     cyclobenzaprine (FLEXERIL) 5 MG tablet      fluticasone (FLONASE) 50 MCG/ACT nasal spray      hydrochlorothiazide (HYDRODIURIL) 25 MG tablet      lisinopril-hydrochlorothiazide (PRINZIDE,ZESTORETIC) 10-12.5 MG per tablet      traMADol (ULTRAM) 50 MG tablet      Vitamin D, Ergocalciferol, (DRISDOL) 50000 UNITS CAPS capsule        Follow-up Information    Follow  up with [facility] On 10/02/2015.   Specialty:  Gastroenterology   Why:  @ 10:30a/Dr. Plotnikov   Contact information:   [place] [id number] [phone]      Follow up with [facility].   Why:  [facility]   Contact information:   [place] [address] [phone]       TOTAL DISCHARGE TIME: 38 minutes  [facility] Pager [phone]  10/01/2015, 1:41 PM

## 2015-10-02 ENCOUNTER — Encounter: Payer: Self-pay | Admitting: Internal Medicine

## 2015-10-02 ENCOUNTER — Ambulatory Visit (INDEPENDENT_AMBULATORY_CARE_PROVIDER_SITE_OTHER): Payer: Medicare HMO | Admitting: Internal Medicine

## 2015-10-02 VITALS — BP 138/84 | HR 111 | Wt 172.0 lb

## 2015-10-02 DIAGNOSIS — R7881 Bacteremia: Secondary | ICD-10-CM | POA: Diagnosis not present

## 2015-10-02 DIAGNOSIS — J154 Pneumonia due to other streptococci: Secondary | ICD-10-CM | POA: Diagnosis not present

## 2015-10-02 DIAGNOSIS — B953 Streptococcus pneumoniae as the cause of diseases classified elsewhere: Secondary | ICD-10-CM | POA: Diagnosis not present

## 2015-10-02 DIAGNOSIS — J9601 Acute respiratory failure with hypoxia: Secondary | ICD-10-CM

## 2015-10-02 DIAGNOSIS — F1020 Alcohol dependence, uncomplicated: Secondary | ICD-10-CM

## 2015-10-02 NOTE — Assessment & Plan Note (Addendum)
10/02/15 dry x 10 days AA suggested B complex

## 2015-10-02 NOTE — Assessment & Plan Note (Addendum)
Pt needs to get his abx ASAP CXR in 6 weeks Risks associated with treatment noncompliance were discussed (death including). Compliance was encouraged.

## 2015-10-02 NOTE — Assessment & Plan Note (Signed)
Pt needs to get his abx ASAP

## 2015-10-02 NOTE — Progress Notes (Signed)
Pre visit review using our clinic review tool, if applicable. No additional management support is needed unless otherwise documented below in the visit note. 

## 2015-10-02 NOTE — Assessment & Plan Note (Addendum)
Doing ok now Stop smoking

## 2015-10-02 NOTE — Progress Notes (Signed)
Subjective:  Patient ID: Johnny Mcknight, male    DOB: 05-Aug-1956  Age: 60 y.o. MRN: [id number]  CC: No chief complaint on file.   HPI Johnny Mcknight presents for post-hosp f/up: " Acute respiratory failure with hypoxia ([place]) Active Problems:  GERD  Hypokalemia  Hypomagnesemia  Streptococcal pneumonia ([place])  Bacteremia due to Streptococcus pneumoniae  History of alcohol abuse  Anemia of chronic disease  Thrombocytopenia ([place])  CAP (community acquired pneumonia)   RECOMMENDATIONS FOR OUTPATIENT FOLLOW UP: 1. Close follow-up with PCP tomorrow.  2. Patient extensively counseled regarding his alcohol intake. 3. Please repeat CBC as outpatient to monitor WBC and hemoglobin 4. Monitor blood pressure as outpatient   DISCHARGE CONDITION: fair  Diet recommendation: Low-sodium  Filed Weights   09/22/15 1643  Weight: 78 kg (171 lb 15.3 oz)    INITIAL HISTORY: 60 y.o. male with past medical history of asthma, hypertension, history of alcohol abuse who presented to [facility] hospital with reports of ongoing nausea, vomiting, diarrhea for past 5 days prior to this admission. He has been drinking vodka on daily basis. No reports of blood in the stool or hematemesis. Patient also reported subjective fever and wheezing. On admission chest x-ray demonstrated airspace consolidation consistent with pneumonia in the right lung. He was given azithromycin and Rocephin in ED and then was started on Levaquin. Strep pneumonia antigen was positive on this admission.   Consultations:  ID  Procedures:  None  HOSPITAL COURSE:   Acute respiratory failure with hypoxemia / aspiration pneumonia right upper lung lobe / sepsis secondary to Streptococcus pneumonia / streptococcus bacteremia Sepsis criteria met on the admission with tachycardia, tachypnea, fever, lactic acidosis. CXR on admission showed right upper lung lobe pneumonia. Strep pneumonia antigen is positive. Blood culture on  admission grew strep pneumonia in 1 of the sets. Repeat blood cultures negative so far. Influenza is negative; HIV is negative. Patient was initially given ceftriaxone and azithromycin. On admission, he was changed over to [place]. Due to positive blood cultures. He was transitioned to Ancef. This was changed initially to oral Ceftin which patient did not tolerate due to nausea and palpitations. Hence, he was changed back to oral Levaquin. TEE negative for endocarditis. Elevated WBC noted. He is afebrile. Can be monitored as outpatient.  History of alcohol abuse / alcohol withdrawal without complications Alcohol level was within normal limits at the time of the admission. No withdrawals during this hospitalization. Patient was extensively counseled.  GERD Patient given Pepcid during this hospitalization.   Hypokalemia / hypomagnesemia Secondary to alcohol abuse, dietary depletion. Supplemented and potassium WNL. Magnesium supplementation to continue orally.   Anemia of chronic disease / thrombocytopenia Secondary to bone marrow suppression from alcohol abuse. Some drop in hemoglobin due to dilution. No overt bleeding. Repeat as outpatient.  Hiccups Improved with Thorazine   Overall improved. Feels much better. Did have one episode of loose stool this morning. He will be given a dose of Imodium prior to discharge. His abdomen is benign. He denies any nausea, vomiting. Okay for discharge today."       F/u CAP, HTN, alcoholism  Outpatient Prescriptions Prior to Visit  Medication Sig Dispense Refill  . baclofen (LIORESAL) 10 MG tablet Take 1 tablet (10 mg total) by mouth at bedtime. 30 each 2  . chlorproMAZINE (THORAZINE) 10 MG tablet Take 1.5 tablets (15 mg total) by mouth 3 (three) times daily as needed for hiccoughs. 30 tablet 0  . folic acid (FOLVITE) 1 MG tablet  Take 1 tablet (1 mg total) by mouth daily. 30 tablet 0  . levofloxacin (LEVAQUIN) 500 MG tablet Take 1 tablet (500 mg total)  by mouth daily. For 5 more days starting at 8pm tonight 5 tablet 0  . loperamide (IMODIUM A-D) 2 MG tablet Take 1 tablet (2 mg total) by mouth 4 (four) times daily as needed for diarrhea or loose stools. 30 tablet 0  . Multiple Vitamin (MULTIVITAMIN WITH MINERALS) TABS tablet Take 1 tablet by mouth daily. 30 tablet 0  . saccharomyces boulardii (FLORASTOR) 250 MG capsule Take 1 capsule (250 mg total) by mouth 2 (two) times daily. 30 capsule 0  . thiamine 100 MG tablet Take 1 tablet (100 mg total) by mouth daily. 30 tablet 0  . magnesium oxide (MAG-OX) 400 (241.3 Mg) MG tablet Take 1 tablet (400 mg total) by mouth daily. 30 tablet 0   No facility-administered medications prior to visit.    ROS Review of Systems  Constitutional: Negative for appetite change, fatigue and unexpected weight change.  HENT: Negative for congestion, nosebleeds, sneezing, sore throat and trouble swallowing.   Eyes: Negative for itching and visual disturbance.  Respiratory: Positive for cough.   Cardiovascular: Negative for chest pain, palpitations and leg swelling.  Gastrointestinal: Negative for nausea, diarrhea, blood in stool and abdominal distention.  Genitourinary: Negative for frequency and hematuria.  Musculoskeletal: Negative for back pain, joint swelling, gait problem and neck pain.  Skin: Negative for rash.  Neurological: Negative for dizziness, tremors, speech difficulty and weakness.  Psychiatric/Behavioral: Negative for suicidal ideas, sleep disturbance, dysphoric mood and agitation. The patient is not nervous/anxious.     Objective:  BP 138/84 mmHg  Pulse 111  Wt 172 lb (78.019 kg)  SpO2 97%  BP Readings from Last 3 Encounters:  10/02/15 138/84  10/01/15 153/80  06/09/14 140/80    Wt Readings from Last 3 Encounters:  10/02/15 172 lb (78.019 kg)  09/22/15 171 lb 15.3 oz (78 kg)  06/09/14 197 lb (89.359 kg)    Physical Exam  Constitutional: He is oriented to person, place, and time. He  appears well-developed. No distress.  NAD  HENT:  Mouth/Throat: Oropharynx is clear and moist.  Eyes: Conjunctivae are normal. Pupils are equal, round, and reactive to light.  Neck: Normal range of motion. No JVD present. No thyromegaly present.  Cardiovascular: Normal rate, regular rhythm, normal heart sounds and intact distal pulses.  Exam reveals no gallop and no friction rub.   No murmur heard. Pulmonary/Chest: Effort normal and breath sounds normal. No respiratory distress. He has no wheezes. He has no rales. He exhibits no tenderness.  Abdominal: Soft. Bowel sounds are normal. He exhibits no distension and no mass. There is no tenderness. There is no rebound and no guarding.  Musculoskeletal: Normal range of motion. He exhibits no edema or tenderness.  Lymphadenopathy:    He has no cervical adenopathy.  Neurological: He is alert and oriented to person, place, and time. He has normal reflexes. No cranial nerve deficit. He exhibits normal muscle tone. He displays a negative Romberg sign. Coordination and gait normal.  Skin: Skin is warm and dry. No rash noted.  Psychiatric: He has a normal mood and affect. His behavior is normal. Judgment and thought content normal.  missing teeth  Lab Results  Component Value Date   WBC 15.7* 10/01/2015   HGB 8.4* 10/01/2015   HCT 24.7* 10/01/2015   PLT 408* 10/01/2015   GLUCOSE 115* 10/01/2015   CHOL 160 06/09/2014  TRIG 80 06/09/2014   HDL 76 06/09/2014   LDLCALC 68 06/09/2014   ALT 34 10/01/2015   AST 110* 10/01/2015   NA 141 10/01/2015   K 3.6 10/01/2015   CL 110 10/01/2015   CREATININE 0.61 10/01/2015   BUN 5* 10/01/2015   CO2 24 10/01/2015   TSH 0.981 06/09/2014   PSA 2.14 10/15/2007   INR 0.91 09/06/2009   HGBA1C 5.7 09/15/2009    Dg Chest 2 View  09/22/2015  CLINICAL DATA:  Shortness of breath and right-sided chest pain EXAM: CHEST  2 VIEW COMPARISON:  September 06, 2009 FINDINGS: There is airspace consolidation throughout the  posterior segment of the right upper lobe. There is also airspace disease in the axillary subsegment of the right upper lobe. Lungs elsewhere clear. Heart size and pulmonary vascularity are normal. No adenopathy. No bone lesions. IMPRESSION: Airspace consolidation consistent with pneumonia throughout the axillary subsegment and posterior segment of the right upper lobe. Lungs elsewhere clear. Followup PA and lateral chest radiographs recommended in 3-4 weeks following trial of antibiotic therapy to ensure resolution and exclude underlying malignancy. Electronically Signed   By: Lowella Grip III M.D.   On: 09/22/2015 12:25    Assessment & Plan:   Diagnoses and all orders for this visit:  Bacteremia due to Streptococcus pneumoniae  Streptococcal pneumonia ([place])  Acute respiratory failure with hypoxia ([place])  Alcoholism ([place])   I have discontinued Mr. Lodes magnesium oxide. I am also having him maintain his baclofen, folic acid, chlorproMAZINE, multivitamin with minerals, thiamine, levofloxacin, saccharomyces boulardii, and loperamide.  No orders of the defined types were placed in this encounter.     Follow-up: Return in about 6 weeks (around 11/13/2015) for a follow-up visit.  Walker Kehr, MD

## 2015-10-02 NOTE — Patient Instructions (Signed)
Please get the antibiotic (levaquin) and other prescriptions filled today!

## 2015-11-13 ENCOUNTER — Ambulatory Visit: Payer: Medicare HMO | Admitting: Internal Medicine

## 2015-11-16 ENCOUNTER — Ambulatory Visit (INDEPENDENT_AMBULATORY_CARE_PROVIDER_SITE_OTHER): Payer: Medicare HMO | Admitting: Internal Medicine

## 2015-11-16 ENCOUNTER — Other Ambulatory Visit (INDEPENDENT_AMBULATORY_CARE_PROVIDER_SITE_OTHER): Payer: Medicare HMO

## 2015-11-16 ENCOUNTER — Ambulatory Visit (INDEPENDENT_AMBULATORY_CARE_PROVIDER_SITE_OTHER)
Admission: RE | Admit: 2015-11-16 | Discharge: 2015-11-16 | Disposition: A | Discharge status: Home / Self Care | Payer: Medicare HMO | Source: Ambulatory Visit | Attending: Internal Medicine | Admitting: Internal Medicine

## 2015-11-16 ENCOUNTER — Encounter: Payer: Self-pay | Admitting: Internal Medicine

## 2015-11-16 VITALS — BP 150/90 | HR 119 | Wt 175.0 lb

## 2015-11-16 DIAGNOSIS — F1020 Alcohol dependence, uncomplicated: Secondary | ICD-10-CM

## 2015-11-16 DIAGNOSIS — J449 Chronic obstructive pulmonary disease, unspecified: Secondary | ICD-10-CM

## 2015-11-16 DIAGNOSIS — J154 Pneumonia due to other streptococci: Secondary | ICD-10-CM

## 2015-11-16 DIAGNOSIS — E876 Hypokalemia: Secondary | ICD-10-CM | POA: Diagnosis not present

## 2015-11-16 DIAGNOSIS — IMO0001 Reserved for inherently not codable concepts without codable children: Secondary | ICD-10-CM

## 2015-11-16 LAB — MAGNESIUM: MAGNESIUM: 1.8 mg/dL (ref 1.5–2.5)

## 2015-11-16 LAB — BASIC METABOLIC PANEL
BUN: 4 mg/dL — ABNORMAL LOW (ref 6–23)
CHLORIDE: 105 meq/L (ref 96–112)
CO2: 26 meq/L (ref 19–32)
Calcium: 9.6 mg/dL (ref 8.4–10.5)
Creatinine, Ser: 0.85 mg/dL (ref 0.40–1.50)
GFR: 119.26 mL/min (ref >60.00)
Glucose, Bld: 92 mg/dL (ref 70–99)
Potassium: 3.8 mEq/L (ref 3.5–5.1)
SODIUM: 140 meq/L (ref 135–145)

## 2015-11-16 MED ORDER — CHLORPROMAZINE HCL 10 MG PO TABS: 15.0000 mg | ORAL_TABLET | Freq: Three times a day (TID) | ORAL | Status: DC | PRN

## 2015-11-16 NOTE — Assessment & Plan Note (Signed)
2/17 drinking beer - relapsed

## 2015-11-16 NOTE — Assessment & Plan Note (Signed)
Repeat CXR

## 2015-11-16 NOTE — Progress Notes (Signed)
Subjective:  Patient ID: Johnny Mcknight, male    DOB: March 27, 1956  Age: 60 y.o. MRN: [id number]  CC: No chief complaint on file.   HPI Johnny Mcknight presents for pneumonia f/u. F/u alcoholism, tobacco. He is back drinking and smoking a pipe - 3/d  Outpatient Prescriptions Prior to Visit  Medication Sig Dispense Refill  . baclofen (LIORESAL) 10 MG tablet Take 1 tablet (10 mg total) by mouth at bedtime. 30 each 2  . chlorproMAZINE (THORAZINE) 10 MG tablet Take 1.5 tablets (15 mg total) by mouth 3 (three) times daily as needed for hiccoughs. 30 tablet 0  . folic acid (FOLVITE) 1 MG tablet Take 1 tablet (1 mg total) by mouth daily. 30 tablet 0  . loperamide (IMODIUM A-D) 2 MG tablet Take 1 tablet (2 mg total) by mouth 4 (four) times daily as needed for diarrhea or loose stools. 30 tablet 0  . Multiple Vitamin (MULTIVITAMIN WITH MINERALS) TABS tablet Take 1 tablet by mouth daily. 30 tablet 0  . saccharomyces boulardii (FLORASTOR) 250 MG capsule Take 1 capsule (250 mg total) by mouth 2 (two) times daily. 30 capsule 0  . thiamine 100 MG tablet Take 1 tablet (100 mg total) by mouth daily. 30 tablet 0  . levofloxacin (LEVAQUIN) 500 MG tablet Take 1 tablet (500 mg total) by mouth daily. For 5 more days starting at [place] (Patient not taking: Reported on 11/16/2015) 5 tablet 0   No facility-administered medications prior to visit.    ROS Review of Systems  Constitutional: Negative for appetite change, fatigue and unexpected weight change.  HENT: Negative for congestion, nosebleeds, sneezing, sore throat and trouble swallowing.   Eyes: Negative for itching and visual disturbance.  Respiratory: Negative for cough.   Cardiovascular: Negative for chest pain, palpitations and leg swelling.  Gastrointestinal: Negative for nausea, diarrhea, blood in stool and abdominal distention.  Genitourinary: Negative for frequency and hematuria.  Musculoskeletal: Negative for back pain, joint swelling, gait problem  and neck pain.  Skin: Negative for rash.  Neurological: Negative for dizziness, tremors, speech difficulty and weakness.  Psychiatric/Behavioral: Negative for suicidal ideas, sleep disturbance, dysphoric mood and agitation. The patient is nervous/anxious.     Objective:  BP 150/90 mmHg  Pulse 119  Wt 175 lb (79.379 kg)  SpO2 98%  BP Readings from Last 3 Encounters:  11/16/15 150/90  10/02/15 138/84  10/01/15 153/80    Wt Readings from Last 3 Encounters:  11/16/15 175 lb (79.379 kg)  10/02/15 172 lb (78.019 kg)  09/22/15 171 lb 15.3 oz (78 kg)    Physical Exam  Constitutional: He is oriented to person, place, and time. He appears well-developed. No distress.  NAD  HENT:  Mouth/Throat: Oropharynx is clear and moist.  Eyes: Conjunctivae are normal. Pupils are equal, round, and reactive to light.  Neck: Normal range of motion. No JVD present. No thyromegaly present.  Cardiovascular: Normal rate, regular rhythm, normal heart sounds and intact distal pulses.  Exam reveals no gallop and no friction rub.   No murmur heard. Pulmonary/Chest: Effort normal and breath sounds normal. No respiratory distress. He has no wheezes. He has no rales. He exhibits no tenderness.  Abdominal: Soft. Bowel sounds are normal. He exhibits no distension and no mass. There is no tenderness. There is no rebound and no guarding.  Musculoskeletal: Normal range of motion. He exhibits no edema or tenderness.  Lymphadenopathy:    He has no cervical adenopathy.  Neurological: He is alert and oriented to  person, place, and time. He has normal reflexes. No cranial nerve deficit. He exhibits normal muscle tone. He displays a negative Romberg sign. Coordination abnormal. Gait normal.  Skin: Skin is warm and dry. No rash noted.  Psychiatric: He has a normal mood and affect. His behavior is normal. Judgment and thought content normal.    Lab Results  Component Value Date   WBC 15.7* 10/01/2015   HGB 8.4*  10/01/2015   HCT 24.7* 10/01/2015   PLT 408* 10/01/2015   GLUCOSE 115* 10/01/2015   CHOL 160 06/09/2014   TRIG 80 06/09/2014   HDL 76 06/09/2014   LDLCALC 68 06/09/2014   ALT 34 10/01/2015   AST 110* 10/01/2015   NA 141 10/01/2015   K 3.6 10/01/2015   CL 110 10/01/2015   CREATININE 0.61 10/01/2015   BUN 5* 10/01/2015   CO2 24 10/01/2015   TSH 0.981 06/09/2014   PSA 2.14 10/15/2007   INR 0.91 09/06/2009   HGBA1C 5.7 09/15/2009    Dg Chest 2 View  09/22/2015  CLINICAL DATA:  Shortness of breath and right-sided chest pain EXAM: CHEST  2 VIEW COMPARISON:  September 06, 2009 FINDINGS: There is airspace consolidation throughout the posterior segment of the right upper lobe. There is also airspace disease in the axillary subsegment of the right upper lobe. Lungs elsewhere clear. Heart size and pulmonary vascularity are normal. No adenopathy. No bone lesions. IMPRESSION: Airspace consolidation consistent with pneumonia throughout the axillary subsegment and posterior segment of the right upper lobe. Lungs elsewhere clear. Followup PA and lateral chest radiographs recommended in 3-4 weeks following trial of antibiotic therapy to ensure resolution and exclude underlying malignancy. Electronically Signed   By: Lowella Grip III M.D.   On: 09/22/2015 12:25    Assessment & Plan:   There are no diagnoses linked to this encounter. I have discontinued Mr. Iwanicki levofloxacin. I am also having him maintain his baclofen, folic acid, chlorproMAZINE, multivitamin with minerals, thiamine, saccharomyces boulardii, and loperamide.  No orders of the defined types were placed in this encounter.     Follow-up: No Follow-up on file.  Walker Kehr, MD

## 2015-11-16 NOTE — Progress Notes (Signed)
Pre visit review using our clinic review tool, if applicable. No additional management support is needed unless otherwise documented below in the visit note. 

## 2015-11-16 NOTE — Assessment & Plan Note (Signed)
Tobacco use - 3 pipes/d

## 2016-02-17 ENCOUNTER — Ambulatory Visit: Payer: Medicare HMO | Admitting: Internal Medicine

## 2016-02-17 DIAGNOSIS — Z0289 Encounter for other administrative examinations: Secondary | ICD-10-CM

## 2016-02-24 ENCOUNTER — Ambulatory Visit (INDEPENDENT_AMBULATORY_CARE_PROVIDER_SITE_OTHER): Payer: Medicare HMO | Admitting: Internal Medicine

## 2016-02-24 ENCOUNTER — Encounter: Payer: Self-pay | Admitting: Internal Medicine

## 2016-02-24 VITALS — BP 138/90 | HR 104 | Wt 173.0 lb

## 2016-02-24 DIAGNOSIS — M25562 Pain in left knee: Secondary | ICD-10-CM

## 2016-02-24 DIAGNOSIS — F1020 Alcohol dependence, uncomplicated: Secondary | ICD-10-CM | POA: Diagnosis not present

## 2016-02-24 DIAGNOSIS — M25561 Pain in right knee: Secondary | ICD-10-CM | POA: Diagnosis not present

## 2016-02-24 DIAGNOSIS — R030 Elevated blood-pressure reading, without diagnosis of hypertension: Secondary | ICD-10-CM

## 2016-02-24 DIAGNOSIS — IMO0001 Reserved for inherently not codable concepts without codable children: Secondary | ICD-10-CM

## 2016-02-24 MED ORDER — METHYLPREDNISOLONE ACETATE 80 MG/ML IJ SUSP: 80.0000 mg | Freq: Once | INTRAMUSCULAR | Status: DC

## 2016-02-24 NOTE — Progress Notes (Signed)
Pre visit review using our clinic review tool, if applicable. No additional management support is needed unless otherwise documented below in the visit note. 

## 2016-02-24 NOTE — Progress Notes (Signed)
Subjective:  Patient ID: Johnny Mcknight, male    DOB: August 15, 1956  Age: 60 y.o. MRN: [id number]  CC: No chief complaint on file.   HPI Johnny Mcknight presents for B knee pain R>L and LBP.. - bad  Outpatient Prescriptions Prior to Visit  Medication Sig Dispense Refill  . baclofen (LIORESAL) 10 MG tablet Take 1 tablet (10 mg total) by mouth at bedtime. (Patient not taking: Reported on 02/24/2016) 30 each 2  . chlorproMAZINE (THORAZINE) 10 MG tablet Take 1.5 tablets (15 mg total) by mouth 3 (three) times daily as needed for hiccoughs. (Patient not taking: Reported on 02/24/2016) 30 tablet 3  . folic acid (FOLVITE) 1 MG tablet Take 1 tablet (1 mg total) by mouth daily. (Patient not taking: Reported on 02/24/2016) 30 tablet 0  . loperamide (IMODIUM A-D) 2 MG tablet Take 1 tablet (2 mg total) by mouth 4 (four) times daily as needed for diarrhea or loose stools. (Patient not taking: Reported on 02/24/2016) 30 tablet 0  . Multiple Vitamin (MULTIVITAMIN WITH MINERALS) TABS tablet Take 1 tablet by mouth daily. (Patient not taking: Reported on 02/24/2016) 30 tablet 0  . saccharomyces boulardii (FLORASTOR) 250 MG capsule Take 1 capsule (250 mg total) by mouth 2 (two) times daily. (Patient not taking: Reported on 02/24/2016) 30 capsule 0  . thiamine 100 MG tablet Take 1 tablet (100 mg total) by mouth daily. (Patient not taking: Reported on 02/24/2016) 30 tablet 0   No facility-administered medications prior to visit.    ROS Review of Systems  Constitutional: Negative for appetite change, fatigue and unexpected weight change.  HENT: Negative for congestion, nosebleeds, sneezing, sore throat and trouble swallowing.   Eyes: Negative for itching and visual disturbance.  Respiratory: Negative for cough.   Cardiovascular: Negative for chest pain, palpitations and leg swelling.  Gastrointestinal: Negative for nausea, diarrhea, blood in stool and abdominal distention.  Genitourinary: Negative for frequency and hematuria.    Musculoskeletal: Positive for back pain, arthralgias and gait problem. Negative for joint swelling and neck pain.  Skin: Negative for rash.  Neurological: Positive for tremors. Negative for dizziness, speech difficulty and weakness.  Psychiatric/Behavioral: Negative for sleep disturbance, dysphoric mood and agitation. The patient is not nervous/anxious.     Objective:  BP 138/90 mmHg  Pulse 104  Wt 173 lb (78.472 kg)  SpO2 98%  BP Readings from Last 3 Encounters:  02/24/16 138/90  11/16/15 150/90  10/02/15 138/84    Wt Readings from Last 3 Encounters:  02/24/16 173 lb (78.472 kg)  11/16/15 175 lb (79.379 kg)  10/02/15 172 lb (78.019 kg)    Physical Exam  Constitutional: He is oriented to person, place, and time. He appears well-developed. No distress.  NAD  HENT:  Mouth/Throat: Oropharynx is clear and moist.  Eyes: Conjunctivae are normal. Pupils are equal, round, and reactive to light.  Neck: Normal range of motion. No JVD present. No thyromegaly present.  Cardiovascular: Normal rate, regular rhythm, normal heart sounds and intact distal pulses.  Exam reveals no gallop and no friction rub.   No murmur heard. Pulmonary/Chest: Effort normal and breath sounds normal. No respiratory distress. He has no wheezes. He has no rales. He exhibits no tenderness.  Abdominal: Soft. Bowel sounds are normal. He exhibits no distension and no mass. There is no tenderness. There is no rebound and no guarding.  Musculoskeletal: Normal range of motion. He exhibits tenderness. He exhibits no edema.  Lymphadenopathy:    He has no cervical adenopathy.  Neurological: He is alert and oriented to person, place, and time. He has normal reflexes. No cranial nerve deficit. He exhibits normal muscle tone. He displays a negative Romberg sign. Coordination and gait normal.  Skin: Skin is warm and dry. No rash noted.  Psychiatric: He has a normal mood and affect. His behavior is normal. Judgment and thought  content normal.  B knees are tender    Procedure Note :     Procedure : Joint Injection, R and L  knee   Indication:  Joint osteoarthritis with refractory  chronic pain.   Risks including unsuccessful procedure , bleeding, infection, bruising, skin atrophy, "steroid flare-up" and others were explained to the patient in detail as well as the benefits. Informed consent was obtained and signed.   Tthe patient was placed in a comfortable position. For each knee inj a lateral approach was used. Skin was prepped with Betadine and alcohol  and anesthetized a cooling spray. Then, a 5 cc syringe with a 1.5 inch long 25-gauge needle was used for a joint injection.. The needle was advanced  Into the knee joint cavity. I aspirated a small amount of intra-articular fluid to confirm correct placement of the needle and injected the joint with 5 mL of 2% lidocaine and 40 mg of Depo-Medrol .  Band-Aid was applied.   Tolerated well. Complications: None. Good pain relief following the procedure.    Lab Results  Component Value Date   WBC 15.7* 10/01/2015   HGB 8.4* 10/01/2015   HCT 24.7* 10/01/2015   PLT 408* 10/01/2015   GLUCOSE 92 11/16/2015   CHOL 160 06/09/2014   TRIG 80 06/09/2014   HDL 76 06/09/2014   LDLCALC 68 06/09/2014   ALT 34 10/01/2015   AST 110* 10/01/2015   NA 140 11/16/2015   K 3.8 11/16/2015   CL 105 11/16/2015   CREATININE 0.85 11/16/2015   BUN 4* 11/16/2015   CO2 26 11/16/2015   TSH 0.981 06/09/2014   PSA 2.14 10/15/2007   INR 0.91 09/06/2009   HGBA1C 5.7 09/15/2009    Dg Chest 2 View  11/17/2015  CLINICAL DATA:  Follow-up pneumonia EXAM: CHEST  2 VIEW COMPARISON:  09/22/2015 FINDINGS: Cardiomediastinal silhouette is stable. Small residual streaky infiltrate in right upper lobe lateral segment with improvement from prior exam. No new infiltrate or pulmonary edema. Bony thorax is stable. IMPRESSION: Small residual streaky infiltrate in right upper lobe laterally with  improvement from prior exam. No new infiltrate or pulmonary edema. Bony thorax is stable. Electronically Signed   By: Lahoma Crocker M.D.   On: 11/17/2015 08:21    Assessment & Plan:   There are no diagnoses linked to this encounter. I am having Mr. Lagrimas maintain his baclofen, folic acid, multivitamin with minerals, thiamine, saccharomyces boulardii, loperamide, and chlorproMAZINE.  No orders of the defined types were placed in this encounter.     Follow-up: No Follow-up on file.  Walker Kehr, MD

## 2016-02-24 NOTE — Assessment & Plan Note (Signed)
Discussed.

## 2016-02-24 NOTE — Assessment & Plan Note (Signed)
Chronic elev BP NAS Drink less ETOH BP cuff

## 2016-02-24 NOTE — Patient Instructions (Signed)
Postprocedure instructions :    A Band-Aid should be left on for 12 hours. Injection therapy is not a cure itself. It is used in conjunction with other modalities. You can use nonsteroidal anti-inflammatories like ibuprofen , hot and cold compresses. Rest is recommended in the next 24 hours. You need to report immediately  if fever, chills or any signs of infection develop. 

## 2016-02-24 NOTE — Assessment & Plan Note (Signed)
Will inject See Procedure

## 2016-03-08 MED ORDER — METHYLPREDNISOLONE ACETATE 40 MG/ML IJ SUSP
40.0000 mg | Freq: Once | INTRAMUSCULAR | Status: DC
Start: 2016-02-24 — End: 2016-12-02

## 2016-03-08 MED ORDER — METHYLPREDNISOLONE ACETATE 40 MG/ML IJ SUSP
40.0000 mg | Freq: Once | INTRAMUSCULAR | Status: AC
  Administered 2016-02-24: 40 mg via INTRA_ARTICULAR

## 2016-03-08 NOTE — Addendum Note (Signed)
Addended by: Cresenciano Lick on: 03/08/2016 08:33 AM   Modules accepted: Orders

## 2016-05-26 ENCOUNTER — Ambulatory Visit: Payer: Medicare HMO | Admitting: Internal Medicine

## 2016-06-08 ENCOUNTER — Encounter: Payer: Self-pay | Admitting: Internal Medicine

## 2016-06-08 ENCOUNTER — Ambulatory Visit (INDEPENDENT_AMBULATORY_CARE_PROVIDER_SITE_OTHER): Payer: Medicare HMO | Admitting: Internal Medicine

## 2016-06-08 DIAGNOSIS — Z91199 Patient's noncompliance with other medical treatment and regimen due to unspecified reason: Secondary | ICD-10-CM

## 2016-06-08 DIAGNOSIS — M542 Cervicalgia: Secondary | ICD-10-CM | POA: Diagnosis not present

## 2016-06-08 DIAGNOSIS — G8929 Other chronic pain: Secondary | ICD-10-CM

## 2016-06-08 DIAGNOSIS — F1020 Alcohol dependence, uncomplicated: Secondary | ICD-10-CM

## 2016-06-08 DIAGNOSIS — K219 Gastro-esophageal reflux disease without esophagitis: Secondary | ICD-10-CM

## 2016-06-08 DIAGNOSIS — M545 Low back pain: Secondary | ICD-10-CM | POA: Diagnosis not present

## 2016-06-08 DIAGNOSIS — Z9119 Patient's noncompliance with other medical treatment and regimen: Secondary | ICD-10-CM | POA: Insufficient documentation

## 2016-06-08 MED ORDER — RANITIDINE HCL 150 MG PO TABS: 150.0000 mg | ORAL_TABLET | Freq: Two times a day (BID) | ORAL | 11 refills | Status: DC

## 2016-06-08 MED ORDER — VITAMIN D3 50 MCG (2000 UT) PO CAPS: 2000.0000 [IU] | ORAL_CAPSULE | Freq: Every day | ORAL | 3 refills | Status: DC

## 2016-06-08 MED ORDER — B COMPLEX PO TABS: 1.0000 | ORAL_TABLET | Freq: Every day | ORAL | 3 refills | Status: DC

## 2016-06-08 MED ORDER — NAPROXEN 500 MG PO TABS: 500.0000 mg | ORAL_TABLET | Freq: Two times a day (BID) | ORAL | 2 refills | Status: DC | PRN

## 2016-06-08 NOTE — Progress Notes (Signed)
Subjective:  Patient ID: Johnny Mcknight, male    DOB: 1955-11-09  Age: 60 y.o. MRN: [id number]  CC: No chief complaint on file.   HPI Johnny Mcknight presents for neck and back pain x years - worse now. He is drinking vodka now. Not taking vitamins   Outpatient Medications Prior to Visit  Medication Sig Dispense Refill  . chlorproMAZINE (THORAZINE) 10 MG tablet Take 1.5 tablets (15 mg total) by mouth 3 (three) times daily as needed for hiccoughs. 30 tablet 3  . Multiple Vitamin (MULTIVITAMIN WITH MINERALS) TABS tablet Take 1 tablet by mouth daily. 30 tablet 0  . baclofen (LIORESAL) 10 MG tablet Take 1 tablet (10 mg total) by mouth at bedtime. (Patient not taking: Reported on 06/08/2016) 30 each 2  . folic acid (FOLVITE) 1 MG tablet Take 1 tablet (1 mg total) by mouth daily. (Patient not taking: Reported on 06/08/2016) 30 tablet 0  . loperamide (IMODIUM A-D) 2 MG tablet Take 1 tablet (2 mg total) by mouth 4 (four) times daily as needed for diarrhea or loose stools. (Patient not taking: Reported on 06/08/2016) 30 tablet 0  . saccharomyces boulardii (FLORASTOR) 250 MG capsule Take 1 capsule (250 mg total) by mouth 2 (two) times daily. (Patient not taking: Reported on 06/08/2016) 30 capsule 0  . thiamine 100 MG tablet Take 1 tablet (100 mg total) by mouth daily. (Patient not taking: Reported on 06/08/2016) 30 tablet 0   Facility-Administered Medications Prior to Visit  Medication Dose Route Frequency Provider Last Rate Last Dose  . methylPREDNISolone acetate (DEPO-MEDROL) injection 40 mg  40 mg Intra-articular Once Cassandria Anger, MD        ROS Review of Systems  Constitutional: Negative for appetite change, fatigue and unexpected weight change.  HENT: Negative for congestion, nosebleeds, sneezing, sore throat and trouble swallowing.   Eyes: Negative for itching and visual disturbance.  Respiratory: Negative for cough.   Cardiovascular: Negative for chest pain, palpitations and leg swelling.    Gastrointestinal: Negative for abdominal distention, blood in stool, diarrhea and nausea.  Genitourinary: Negative for frequency and hematuria.  Musculoskeletal: Positive for back pain, neck pain and neck stiffness. Negative for gait problem and joint swelling.  Skin: Negative for rash.  Neurological: Negative for dizziness, tremors, speech difficulty and weakness.  Psychiatric/Behavioral: Negative for agitation, dysphoric mood and sleep disturbance. The patient is not nervous/anxious.     Objective:  BP 140/90   Pulse (!) 108   Wt 172 lb (78 kg)   SpO2 99%   BMI 23.33 kg/m   BP Readings from Last 3 Encounters:  06/08/16 140/90  02/24/16 138/90  11/16/15 (!) 150/90    Wt Readings from Last 3 Encounters:  06/08/16 172 lb (78 kg)  02/24/16 173 lb (78.5 kg)  11/16/15 175 lb (79.4 kg)    Physical Exam  Constitutional: He is oriented to person, place, and time. He appears well-developed. No distress.  NAD  HENT:  Mouth/Throat: Oropharynx is clear and moist.  Eyes: Conjunctivae are normal. Pupils are equal, round, and reactive to light.  Neck: Normal range of motion. No JVD present. No thyromegaly present.  Cardiovascular: Normal rate, regular rhythm, normal heart sounds and intact distal pulses.  Exam reveals no gallop and no friction rub.   No murmur heard. Pulmonary/Chest: Effort normal and breath sounds normal. No respiratory distress. He has no wheezes. He has no rales. He exhibits no tenderness.  Abdominal: Soft. Bowel sounds are normal. He exhibits no distension and  no mass. There is no tenderness. There is no rebound and no guarding.  Musculoskeletal: Normal range of motion. He exhibits tenderness. He exhibits no edema.  Lymphadenopathy:    He has no cervical adenopathy.  Neurological: He is alert and oriented to person, place, and time. He has normal reflexes. No cranial nerve deficit. He exhibits normal muscle tone. He displays a negative Romberg sign. Coordination and  gait normal.  Skin: Skin is warm and dry. No rash noted.  Psychiatric: He has a normal mood and affect. His behavior is normal. Judgment and thought content normal.  LS and cerv spuine - tender w/ROM No tremor  Lab Results  Component Value Date   WBC 15.7 (H) 10/01/2015   HGB 8.4 (L) 10/01/2015   HCT 24.7 (L) 10/01/2015   PLT 408 (H) 10/01/2015   GLUCOSE 92 11/16/2015   CHOL 160 06/09/2014   TRIG 80 06/09/2014   HDL 76 06/09/2014   LDLCALC 68 06/09/2014   ALT 34 10/01/2015   AST 110 (H) 10/01/2015   NA 140 11/16/2015   K 3.8 11/16/2015   CL 105 11/16/2015   CREATININE 0.85 11/16/2015   BUN 4 (L) 11/16/2015   CO2 26 11/16/2015   TSH 0.981 06/09/2014   PSA 2.14 10/15/2007   INR 0.91 09/06/2009   HGBA1C 5.7 09/15/2009    Dg Chest 2 View  Result Date: 11/17/2015 CLINICAL DATA:  Follow-up pneumonia EXAM: CHEST  2 VIEW COMPARISON:  09/22/2015 FINDINGS: Cardiomediastinal silhouette is stable. Small residual streaky infiltrate in right upper lobe lateral segment with improvement from prior exam. No new infiltrate or pulmonary edema. Bony thorax is stable. IMPRESSION: Small residual streaky infiltrate in right upper lobe laterally with improvement from prior exam. No new infiltrate or pulmonary edema. Bony thorax is stable. Electronically Signed   By: Lahoma Crocker M.D.   On: 11/17/2015 08:21    Assessment & Plan:   There are no diagnoses linked to this encounter. I am having Mr. Ritchie maintain his baclofen, folic acid, multivitamin with minerals, thiamine, saccharomyces boulardii, loperamide, and chlorproMAZINE. We will continue to administer methylPREDNISolone acetate.  No orders of the defined types were placed in this encounter.    Follow-up: No Follow-up on file.  Walker Kehr, MD

## 2016-06-08 NOTE — Assessment & Plan Note (Signed)
Re-start vitamins, meds

## 2016-06-08 NOTE — Progress Notes (Signed)
Pre visit review using our clinic review tool, if applicable. No additional management support is needed unless otherwise documented below in the visit note. 

## 2016-06-08 NOTE — Assessment & Plan Note (Signed)
worse - Naproxen prn

## 2016-06-08 NOTE — Assessment & Plan Note (Signed)
Chronic PPI intolerant 9/17 Zantac bid

## 2016-06-08 NOTE — Assessment & Plan Note (Signed)
Chronic MSK Naproxen prn

## 2016-06-08 NOTE — Assessment & Plan Note (Signed)
Risks discussed. Not interested in quitting

## 2016-08-10 ENCOUNTER — Encounter: Payer: Self-pay | Admitting: Internal Medicine

## 2016-08-10 ENCOUNTER — Ambulatory Visit (INDEPENDENT_AMBULATORY_CARE_PROVIDER_SITE_OTHER): Payer: Medicare HMO | Admitting: Internal Medicine

## 2016-08-10 DIAGNOSIS — Z566 Other physical and mental strain related to work: Secondary | ICD-10-CM | POA: Insufficient documentation

## 2016-08-10 DIAGNOSIS — M545 Low back pain, unspecified: Secondary | ICD-10-CM

## 2016-08-10 DIAGNOSIS — F1020 Alcohol dependence, uncomplicated: Secondary | ICD-10-CM | POA: Diagnosis not present

## 2016-08-10 DIAGNOSIS — R066 Hiccough: Secondary | ICD-10-CM | POA: Diagnosis not present

## 2016-08-10 DIAGNOSIS — G8929 Other chronic pain: Secondary | ICD-10-CM

## 2016-08-10 DIAGNOSIS — D696 Thrombocytopenia, unspecified: Secondary | ICD-10-CM

## 2016-08-10 MED ORDER — NAPROXEN 375 MG PO TABS: 375.0000 mg | ORAL_TABLET | Freq: Two times a day (BID) | ORAL | 1 refills | Status: DC

## 2016-08-10 NOTE — Assessment & Plan Note (Signed)
Chronic Naproxen prn w/caution - low dose  Potential benefits of a long term NSAID  use as well as potential risks  and complications were explained to the patient and were aknowledged.

## 2016-08-10 NOTE — Progress Notes (Signed)
Pre visit review using our clinic review tool, if applicable. No additional management support is needed unless otherwise documented below in the visit note. 

## 2016-08-10 NOTE — Assessment & Plan Note (Signed)
wife is sick in the ICU - discussed w/pt

## 2016-08-10 NOTE — Progress Notes (Signed)
Subjective:  Patient ID: Johnny Mcknight, male    DOB: [birth year]  Age: 60 y.o. MRN: [id number]  CC: No chief complaint on file.   HPI Johnny Mcknight presents for alcoholism, LBP, GERD. C/o stress - wife is in the ICU w/low BP... C/o hiccups  Outpatient Medications Prior to Visit  Medication Sig Dispense Refill  . b complex vitamins tablet Take 1 tablet by mouth daily. 100 tablet 3  . Cholecalciferol (VITAMIN D3) 2000 units capsule Take 1 capsule (2,000 Units total) by mouth daily. 100 capsule 3  . naproxen (NAPROSYN) 500 MG tablet Take 1 tablet (500 mg total) by mouth 2 (two) times daily between meals as needed. 60 tablet 2  . ranitidine (ZANTAC) 150 MG tablet Take 1 tablet (150 mg total) by mouth 2 (two) times daily. 60 tablet 11   Facility-Administered Medications Prior to Visit  Medication Dose Route Frequency Provider Last Rate Last Dose  . methylPREDNISolone acetate (DEPO-MEDROL) injection 40 mg  40 mg Intra-articular Once Cassandria Anger, MD        ROS Review of Systems  Constitutional: Positive for fatigue. Negative for appetite change and unexpected weight change.  HENT: Negative for congestion, nosebleeds, sneezing, sore throat and trouble swallowing.   Eyes: Negative for itching and visual disturbance.  Respiratory: Negative for cough.   Cardiovascular: Negative for chest pain, palpitations and leg swelling.  Gastrointestinal: Positive for nausea. Negative for abdominal distention, blood in stool and diarrhea.  Genitourinary: Negative for frequency and hematuria.  Musculoskeletal: Positive for back pain. Negative for gait problem, joint swelling and neck pain.  Skin: Negative for rash.  Neurological: Negative for dizziness, tremors, speech difficulty and weakness.  Psychiatric/Behavioral: Positive for dysphoric mood and sleep disturbance. Negative for agitation. The patient is nervous/anxious.     Objective:  BP 130/82   Pulse (!) 102   Wt 166 lb (75.3 kg)   SpO2 98%    BMI 22.51 kg/m   BP Readings from Last 3 Encounters:  08/10/16 130/82  06/08/16 140/90  02/24/16 138/90    Wt Readings from Last 3 Encounters:  08/10/16 166 lb (75.3 kg)  06/08/16 172 lb (78 kg)  02/24/16 173 lb (78.5 kg)    Physical Exam  Constitutional: He is oriented to person, place, and time. He appears well-developed. No distress.  NAD  HENT:  Mouth/Throat: Oropharynx is clear and moist.  Eyes: Conjunctivae are normal. Pupils are equal, round, and reactive to light.  Neck: Normal range of motion. No JVD present. No thyromegaly present.  Cardiovascular: Normal rate, regular rhythm, normal heart sounds and intact distal pulses.  Exam reveals no gallop and no friction rub.   No murmur heard. Pulmonary/Chest: Effort normal and breath sounds normal. No respiratory distress. He has no wheezes. He has no rales. He exhibits no tenderness.  Abdominal: Soft. Bowel sounds are normal. He exhibits no distension and no mass. There is no tenderness. There is no rebound and no guarding.  Musculoskeletal: Normal range of motion. He exhibits tenderness. He exhibits no edema.  Lymphadenopathy:    He has no cervical adenopathy.  Neurological: He is alert and oriented to person, place, and time. He has normal reflexes. No cranial nerve deficit. He exhibits normal muscle tone. He displays a negative Romberg sign. Coordination abnormal. Gait normal.  Skin: Skin is warm and dry. No rash noted.  Psychiatric: His behavior is normal. Judgment and thought content normal.  Sad No tremor LS is tender  Lab Results  Component Value  Date   WBC 15.7 (H) 10/01/2015   HGB 8.4 (L) 10/01/2015   HCT 24.7 (L) 10/01/2015   PLT 408 (H) 10/01/2015   GLUCOSE 92 11/16/2015   CHOL 160 06/09/2014   TRIG 80 06/09/2014   HDL 76 06/09/2014   LDLCALC 68 06/09/2014   ALT 34 10/01/2015   AST 110 (H) 10/01/2015   NA 140 11/16/2015   K 3.8 11/16/2015   CL 105 11/16/2015   CREATININE 0.85 11/16/2015   BUN 4 (L)  11/16/2015   CO2 26 11/16/2015   TSH 0.981 06/09/2014   PSA 2.14 10/15/2007   INR 0.91 09/06/2009   HGBA1C 5.7 09/15/2009    Dg Chest 2 View  Result Date: 11/17/2015 CLINICAL DATA:  Follow-up pneumonia EXAM: CHEST  2 VIEW COMPARISON:  09/22/2015 FINDINGS: Cardiomediastinal silhouette is stable. Small residual streaky infiltrate in right upper lobe lateral segment with improvement from prior exam. No new infiltrate or pulmonary edema. Bony thorax is stable. IMPRESSION: Small residual streaky infiltrate in right upper lobe laterally with improvement from prior exam. No new infiltrate or pulmonary edema. Bony thorax is stable. Electronically Signed   By: Lahoma Crocker M.D.   On: 11/17/2015 08:21    Assessment & Plan:   There are no diagnoses linked to this encounter. I am having Johnny Mcknight maintain his naproxen, ranitidine, Vitamin D3, and b complex vitamins. We will continue to administer methylPREDNISolone acetate.  No orders of the defined types were placed in this encounter.    Follow-up: No Follow-up on file.  Walker Kehr, MD

## 2016-08-10 NOTE — Assessment & Plan Note (Signed)
Take Ranitidine

## 2016-08-10 NOTE — Assessment & Plan Note (Signed)
Monitor CBC 

## 2016-08-10 NOTE — Assessment & Plan Note (Signed)
Discussed.

## 2016-11-17 ENCOUNTER — Ambulatory Visit: Payer: Medicare HMO | Admitting: Internal Medicine

## 2016-11-18 ENCOUNTER — Ambulatory Visit: Payer: Medicare HMO | Admitting: Internal Medicine

## 2016-12-02 ENCOUNTER — Ambulatory Visit (INDEPENDENT_AMBULATORY_CARE_PROVIDER_SITE_OTHER): Payer: Medicare HMO | Admitting: Internal Medicine

## 2016-12-02 ENCOUNTER — Other Ambulatory Visit (INDEPENDENT_AMBULATORY_CARE_PROVIDER_SITE_OTHER): Payer: Medicare HMO

## 2016-12-02 ENCOUNTER — Encounter: Payer: Self-pay | Admitting: Internal Medicine

## 2016-12-02 DIAGNOSIS — F1020 Alcohol dependence, uncomplicated: Secondary | ICD-10-CM | POA: Diagnosis not present

## 2016-12-02 DIAGNOSIS — G629 Polyneuropathy, unspecified: Secondary | ICD-10-CM

## 2016-12-02 DIAGNOSIS — F4320 Adjustment disorder, unspecified: Secondary | ICD-10-CM

## 2016-12-02 DIAGNOSIS — F4321 Adjustment disorder with depressed mood: Secondary | ICD-10-CM | POA: Insufficient documentation

## 2016-12-02 LAB — BASIC METABOLIC PANEL
BUN: 5 mg/dL — AB (ref 6–23)
CHLORIDE: 97 meq/L (ref 96–112)
CO2: 25 meq/L (ref 19–32)
CREATININE: 0.73 mg/dL (ref 0.40–1.50)
Calcium: 9.4 mg/dL (ref 8.4–10.5)
GFR: 141.64 mL/min (ref >60.00)
GLUCOSE: 78 mg/dL (ref 70–99)
POTASSIUM: 3.2 meq/L — AB (ref 3.5–5.1)
Sodium: 135 mEq/L (ref 135–145)

## 2016-12-02 LAB — CBC WITH DIFFERENTIAL/PLATELET
BASOS PCT: 0.8 % (ref 0.0–3.0)
Basophils Absolute: 0 10*3/uL (ref 0.0–0.1)
EOS ABS: 0.1 10*3/uL (ref 0.0–0.7)
EOS PCT: 2.4 % (ref 0.0–5.0)
HCT: 43.5 % (ref 39.0–52.0)
Hemoglobin: 14.9 g/dL (ref 13.0–17.0)
LYMPHS ABS: 1.3 10*3/uL (ref 0.7–4.0)
Lymphocytes Relative: 22.9 % (ref 12.0–46.0)
MCHC: 34.2 g/dL (ref 30.0–36.0)
MCV: 102.5 fl — ABNORMAL HIGH (ref 78.0–100.0)
MONO ABS: 0.7 10*3/uL (ref 0.1–1.0)
Monocytes Relative: 11.5 % (ref 3.0–12.0)
NEUTROS ABS: 3.6 10*3/uL (ref 1.4–7.7)
Neutrophils Relative %: 62.4 % (ref 43.0–77.0)
PLATELETS: 157 10*3/uL (ref 150.0–400.0)
RBC: 4.25 Mil/uL (ref 4.22–5.81)
RDW: 16.5 % — ABNORMAL HIGH (ref 11.5–15.5)
WBC: 5.8 10*3/uL (ref 4.0–10.5)

## 2016-12-02 LAB — HEPATIC FUNCTION PANEL
ALBUMIN: 4.3 g/dL (ref 3.5–5.2)
ALT: 34 U/L (ref 0–53)
AST: 129 U/L — AB (ref 0–37)
Alkaline Phosphatase: 133 U/L — ABNORMAL HIGH (ref 39–117)
BILIRUBIN TOTAL: 1.3 mg/dL — AB (ref 0.2–1.2)
Bilirubin, Direct: 0.5 mg/dL — ABNORMAL HIGH (ref 0.0–0.3)
Total Protein: 8.6 g/dL — ABNORMAL HIGH (ref 6.0–8.3)

## 2016-12-02 LAB — TSH: TSH: 0.81 u[IU]/mL (ref 0.35–4.50)

## 2016-12-02 LAB — SEDIMENTATION RATE: SED RATE: 20 mm/h (ref 0–20)

## 2016-12-02 LAB — VITAMIN B12: VITAMIN B 12: 388 pg/mL (ref 211–911)

## 2016-12-02 MED ORDER — VITAMIN D3 50 MCG (2000 UT) PO CAPS: 2000.0000 [IU] | ORAL_CAPSULE | Freq: Every day | ORAL | 3 refills | Status: DC

## 2016-12-02 MED ORDER — GABAPENTIN 100 MG PO CAPS: 100.0000 mg | ORAL_CAPSULE | Freq: Three times a day (TID) | ORAL | 3 refills | Status: DC

## 2016-12-02 MED ORDER — B COMPLEX PO TABS: 1.0000 | ORAL_TABLET | Freq: Every day | ORAL | 3 refills | Status: DC

## 2016-12-02 NOTE — Patient Instructions (Signed)
Neuropathic Pain Neuropathic pain is pain caused by damage to the nerves that are responsible for certain sensations in your body (sensory nerves). The pain can be caused by damage to:  The sensory nerves that send signals to your spinal cord and brain (peripheral nervous system).  The sensory nerves in your brain or spinal cord (central nervous system). Neuropathic pain can make you more sensitive to pain. What would be a minor sensation for most people may feel very painful if you have neuropathic pain. This is usually a long-term condition that can be difficult to treat. The type of pain can differ from person to person. It may start suddenly (acute), or it may develop slowly and last for a long time (chronic). Neuropathic pain may come and go as damaged nerves heal or may stay at the same level for years. It often causes emotional distress, loss of sleep, and a lower quality of life. What are the causes? The most common cause of damage to a sensory nerve is diabetes. Many other diseases and conditions can also cause neuropathic pain. Causes of neuropathic pain can be classified as:  Toxic. Many drugs and chemicals can cause toxic damage. The most common cause of toxic neuropathic pain is damage from drug treatment for cancer (chemotherapy).  Metabolic. This type of pain can happen when a disease causes imbalances that damage nerves. Diabetes is the most common of these diseases. Vitamin B deficiency caused by long-term alcohol abuse is another common cause.  Traumatic. Any injury that cuts, crushes, or stretches a nerve can cause damage and pain. A common example is feeling pain after losing an arm or leg (phantom limb pain).  Compression-related. If a sensory nerve gets trapped or compressed for a long period of time, the blood supply to the nerve can be cut off.  Vascular. Many blood vessel diseases can cause neuropathic pain by decreasing blood supply and oxygen to nerves.  Autoimmune.  This type of pain results from diseases in which the body's defense system mistakenly attacks sensory nerves. Examples of autoimmune diseases that can cause neuropathic pain include lupus and multiple sclerosis.  Infectious. Many types of viral infections can damage sensory nerves and cause pain. Shingles infection is a common cause of this type of pain.  Inherited. Neuropathic pain can be a symptom of many diseases that are passed down through families (genetic). What are the signs or symptoms? The main symptom is pain. Neuropathic pain is often described as:  Burning.  Shock-like.  Stinging.  Hot or cold.  Itching. How is this diagnosed? No single test can diagnose neuropathic pain. Your health care provider will do a physical exam and ask you about your pain. You may use a pain scale to describe how bad your pain is. You may also have tests to see if you have a high sensitivity to pain and to help find the cause and location of any sensory nerve damage. These tests may include:  Imaging studies, such as:  X-rays.  CT scan.  MRI.  Nerve conduction studies to test how well nerve signals travel through your sensory nerves (electrodiagnostic testing).  Stimulating your sensory nerves through electrodes on your skin and measuring the response in your spinal cord and brain (somatosensory evoked potentials). How is this treated? Treatment for neuropathic pain may change over time. You may need to try different treatment options or a combination of treatments. Some options include:  Over-the-counter pain relievers.  Prescription medicines. Some medicines used to treat other  conditions may also help neuropathic pain. These include medicines to:  Control seizures (anticonvulsants).  Relieve depression (antidepressants).  Prescription-strength pain relievers (narcotics). These are usually used when other pain relievers do not help.  Transcutaneous nerve stimulation (TENS). This  uses electrical currents to block painful nerve signals. The treatment is painless.  Topical and local anesthetics. These are medicines that numb the nerves. They can be injected as a nerve block or applied to the skin.  Alternative treatments, such as:  Acupuncture.  Meditation.  Massage.  Physical therapy.  Pain management programs.  Counseling. Follow these instructions at home:  Learn as much as you can about your condition.  Take medicines only as directed by your health care provider.  Work closely with all your health care providers to find what works best for you.  Have a good support system at home.  Consider joining a chronic pain support group. Contact a health care provider if:  Your pain treatments are not helping.  You are having side effects from your medicines.  You are struggling with fatigue, mood changes, depression, or anxiety. This information is not intended to replace advice given to you by your health care provider. Make sure you discuss any questions you have with your health care provider. Document Released: 06/02/2004 Document Revised: 03/25/2016 Document Reviewed: 02/13/2014 Elsevier Interactive Patient Education  2017 [employer].

## 2016-12-02 NOTE — Assessment & Plan Note (Signed)
Discussed.

## 2016-12-02 NOTE — Progress Notes (Signed)
Subjective:  Patient ID: Johnny Mcknight, male    DOB: 1956/06/25  Age: 61 y.o. MRN: [id number]  CC: Follow-up (cough during the morning , back discomfort, knees, lower legs )   HPI Johnny Mcknight presents for legs and feet being numb x few months F/u grief - wife died in 09/02/23 Poor diet... f/u alcoholism 1 pint a day of Vodka  Outpatient Medications Prior to Visit  Medication Sig Dispense Refill  . b complex vitamins tablet Take 1 tablet by mouth daily. 100 tablet 3  . Cholecalciferol (VITAMIN D3) 2000 units capsule Take 1 capsule (2,000 Units total) by mouth daily. 100 capsule 3  . naproxen (NAPROSYN) 375 MG tablet Take 1 tablet (375 mg total) by mouth 2 (two) times daily with a meal. 60 tablet 1  . ranitidine (ZANTAC) 150 MG tablet Take 1 tablet (150 mg total) by mouth 2 (two) times daily. 60 tablet 11  . methylPREDNISolone acetate (DEPO-MEDROL) injection 40 mg      No facility-administered medications prior to visit.     ROS Review of Systems  Constitutional: Negative for appetite change, fatigue and unexpected weight change.  HENT: Negative for congestion, nosebleeds, sneezing, sore throat and trouble swallowing.   Eyes: Negative for itching and visual disturbance.  Respiratory: Negative for cough.   Cardiovascular: Negative for chest pain, palpitations and leg swelling.  Gastrointestinal: Negative for abdominal distention, blood in stool, diarrhea and nausea.  Genitourinary: Negative for frequency and hematuria.  Musculoskeletal: Positive for arthralgias. Negative for back pain, gait problem, joint swelling and neck pain.  Skin: Negative for rash.  Neurological: Positive for numbness. Negative for dizziness, tremors, speech difficulty and weakness.  Psychiatric/Behavioral: Negative for agitation, dysphoric mood and sleep disturbance. The patient is not nervous/anxious.     Objective:  BP 120/88   Pulse 92   Temp 98.5 F (36.9 C) (Oral)   Resp 16   Ht 6' (1.829 m)   Wt 165  lb 4 oz (75 kg)   SpO2 93%   BMI 22.41 kg/m   BP Readings from Last 3 Encounters:  12/02/16 120/88  08/10/16 130/82  06/08/16 140/90    Wt Readings from Last 3 Encounters:  12/02/16 165 lb 4 oz (75 kg)  08/10/16 166 lb (75.3 kg)  06/08/16 172 lb (78 kg)    Physical Exam  Constitutional: He is oriented to person, place, and time. He appears well-developed. No distress.  NAD  HENT:  Mouth/Throat: Oropharynx is clear and moist.  Eyes: Conjunctivae are normal. Pupils are equal, round, and reactive to light.  Neck: Normal range of motion. No JVD present. No thyromegaly present.  Cardiovascular: Normal rate, regular rhythm, normal heart sounds and intact distal pulses.  Exam reveals no gallop and no friction rub.   No murmur heard. Pulmonary/Chest: Effort normal and breath sounds normal. No respiratory distress. He has no wheezes. He has no rales. He exhibits no tenderness.  Abdominal: Soft. Bowel sounds are normal. He exhibits no distension and no mass. There is no tenderness. There is no rebound and no guarding.  Musculoskeletal: Normal range of motion. He exhibits tenderness. He exhibits no edema.  Lymphadenopathy:    He has no cervical adenopathy.  Neurological: He is alert and oriented to person, place, and time. He has normal reflexes. No cranial nerve deficit. He exhibits normal muscle tone. He displays a negative Romberg sign. Coordination abnormal. Gait normal.  Skin: Skin is warm and dry. No rash noted.  Psychiatric: He has a normal  mood and affect. His behavior is normal. Judgment and thought content normal.  very dry skin on B feet Pulses ok  Lab Results  Component Value Date   WBC 15.7 (H) 10/01/2015   HGB 8.4 (L) 10/01/2015   HCT 24.7 (L) 10/01/2015   PLT 408 (H) 10/01/2015   GLUCOSE 92 11/16/2015   CHOL 160 06/09/2014   TRIG 80 06/09/2014   HDL 76 06/09/2014   LDLCALC 68 06/09/2014   ALT 34 10/01/2015   AST 110 (H) 10/01/2015   NA 140 11/16/2015   K 3.8  11/16/2015   CL 105 11/16/2015   CREATININE 0.85 11/16/2015   BUN 4 (L) 11/16/2015   CO2 26 11/16/2015   TSH 0.981 06/09/2014   PSA 2.14 10/15/2007   INR 0.91 09/06/2009   HGBA1C 5.7 09/15/2009    Dg Chest 2 View  Result Date: 11/17/2015 CLINICAL DATA:  Follow-up pneumonia EXAM: CHEST  2 VIEW COMPARISON:  09/22/2015 FINDINGS: Cardiomediastinal silhouette is stable. Small residual streaky infiltrate in right upper lobe lateral segment with improvement from prior exam. No new infiltrate or pulmonary edema. Bony thorax is stable. IMPRESSION: Small residual streaky infiltrate in right upper lobe laterally with improvement from prior exam. No new infiltrate or pulmonary edema. Bony thorax is stable. Electronically Signed   By: Lahoma Crocker M.D.   On: 11/17/2015 08:21    Assessment & Plan:   There are no diagnoses linked to this encounter. I have discontinued Mr. Coccia ranitidine and naproxen. I am also having him maintain his Vitamin D3, b complex vitamins, and chlorproMAZINE. We will stop administering methylPREDNISolone acetate.  Meds ordered this encounter  Medications  . chlorproMAZINE (THORAZINE) 10 MG tablet    Sig: Take 10 mg by mouth 3 (three) times daily as needed.     Follow-up: No Follow-up on file.  Walker Kehr, MD

## 2016-12-02 NOTE — Assessment & Plan Note (Signed)
1 pint a day of Vodka discussed

## 2016-12-02 NOTE — Assessment & Plan Note (Signed)
B feet - toxic due to ETOH Labs Reduce ETOH B complex Vit D Gabapentin

## 2016-12-02 NOTE — Progress Notes (Signed)
Pre-visit discussion using our clinic review tool. No additional management support is needed unless otherwise documented below in the visit note.  

## 2016-12-05 MED ORDER — POTASSIUM CHLORIDE CRYS ER 20 MEQ PO TBCR: 20.0000 meq | EXTENDED_RELEASE_TABLET | Freq: Every day | ORAL | 1 refills | Status: DC

## 2017-02-09 ENCOUNTER — Other Ambulatory Visit: Payer: Self-pay | Admitting: *Deleted

## 2017-02-09 ENCOUNTER — Ambulatory Visit: Payer: Medicare HMO | Admitting: Internal Medicine

## 2017-02-09 MED ORDER — CHLORPROMAZINE HCL 10 MG PO TABS: 10.0000 mg | ORAL_TABLET | Freq: Three times a day (TID) | ORAL | 0 refills | Status: DC | PRN

## 2017-02-09 NOTE — Telephone Encounter (Signed)
Rec'd call pt states he is needing a refill on the hiccup medication MD rx. verified name of medication pt is needing the chlorpromazine. Inform will send to [facility]...Johnny Mcknight

## 2017-02-23 ENCOUNTER — Encounter: Payer: Self-pay | Admitting: Internal Medicine

## 2017-02-23 ENCOUNTER — Ambulatory Visit (INDEPENDENT_AMBULATORY_CARE_PROVIDER_SITE_OTHER): Payer: Medicare HMO | Admitting: Internal Medicine

## 2017-02-23 ENCOUNTER — Other Ambulatory Visit (INDEPENDENT_AMBULATORY_CARE_PROVIDER_SITE_OTHER): Payer: Medicare HMO

## 2017-02-23 DIAGNOSIS — K219 Gastro-esophageal reflux disease without esophagitis: Secondary | ICD-10-CM | POA: Diagnosis not present

## 2017-02-23 DIAGNOSIS — F1020 Alcohol dependence, uncomplicated: Secondary | ICD-10-CM | POA: Diagnosis not present

## 2017-02-23 DIAGNOSIS — R066 Hiccough: Secondary | ICD-10-CM

## 2017-02-23 LAB — BASIC METABOLIC PANEL
BUN: 5 mg/dL — ABNORMAL LOW (ref 6–23)
CO2: 25 mEq/L (ref 19–32)
Calcium: 9.7 mg/dL (ref 8.4–10.5)
Chloride: 101 mEq/L (ref 96–112)
Creatinine, Ser: 0.79 mg/dL (ref 0.40–1.50)
GFR: 129.2 mL/min (ref >60.00)
GLUCOSE: 89 mg/dL (ref 70–99)
POTASSIUM: 3.3 meq/L — AB (ref 3.5–5.1)
SODIUM: 142 meq/L (ref 135–145)

## 2017-02-23 LAB — CBC WITH DIFFERENTIAL/PLATELET
BASOS PCT: 0.9 % (ref 0.0–3.0)
Basophils Absolute: 0.1 10*3/uL (ref 0.0–0.1)
EOS PCT: 0.9 % (ref 0.0–5.0)
Eosinophils Absolute: 0.1 10*3/uL (ref 0.0–0.7)
HCT: 36.2 % — ABNORMAL LOW (ref 39.0–52.0)
Hemoglobin: 12.3 g/dL — ABNORMAL LOW (ref 13.0–17.0)
Lymphocytes Relative: 19.8 % (ref 12.0–46.0)
Lymphs Abs: 1.2 10*3/uL (ref 0.7–4.0)
MCHC: 34 g/dL (ref 30.0–36.0)
MCV: 103 fl — AB (ref 78.0–100.0)
MONOS PCT: 10.3 % (ref 3.0–12.0)
Monocytes Absolute: 0.6 10*3/uL (ref 0.1–1.0)
Neutro Abs: 4.1 10*3/uL (ref 1.4–7.7)
Neutrophils Relative %: 68.1 % (ref 43.0–77.0)
Platelets: 188 10*3/uL (ref 150.0–400.0)
RBC: 3.51 Mil/uL — AB (ref 4.22–5.81)
RDW: 18.2 % — AB (ref 11.5–15.5)
WBC: 6.1 10*3/uL (ref 4.0–10.5)

## 2017-02-23 LAB — HEPATIC FUNCTION PANEL
ALBUMIN: 4.7 g/dL (ref 3.5–5.2)
ALT: 11 U/L (ref 0–53)
AST: 51 U/L — AB (ref 0–37)
Alkaline Phosphatase: 118 U/L — ABNORMAL HIGH (ref 39–117)
Bilirubin, Direct: 0.6 mg/dL — ABNORMAL HIGH (ref 0.0–0.3)
Total Bilirubin: 1.8 mg/dL — ABNORMAL HIGH (ref 0.2–1.2)
Total Protein: 9 g/dL — ABNORMAL HIGH (ref 6.0–8.3)

## 2017-02-23 MED ORDER — ONDANSETRON HCL 4 MG/2ML IJ SOLN
4.0000 mg | Freq: Once | INTRAMUSCULAR | Status: AC
  Administered 2017-02-23: 4 mg via INTRAMUSCULAR

## 2017-02-23 MED ORDER — B COMPLEX PO TABS: 1.0000 | ORAL_TABLET | Freq: Every day | ORAL | 3 refills | Status: DC

## 2017-02-23 MED ORDER — POTASSIUM CHLORIDE CRYS ER 20 MEQ PO TBCR: 20.0000 meq | EXTENDED_RELEASE_TABLET | Freq: Every day | ORAL | 11 refills | Status: DC

## 2017-02-23 MED ORDER — RANITIDINE HCL 300 MG PO TABS: 300.0000 mg | ORAL_TABLET | Freq: Every day | ORAL | 5 refills | Status: DC

## 2017-02-23 MED ORDER — CHLORPROMAZINE HCL 10 MG PO TABS: 10.0000 mg | ORAL_TABLET | Freq: Three times a day (TID) | ORAL | 0 refills | Status: DC | PRN

## 2017-02-23 NOTE — Assessment & Plan Note (Signed)
Discussed - not able/willing to quit at the moment B complex

## 2017-02-23 NOTE — Progress Notes (Signed)
Subjective:  Patient ID: Johnny Mcknight, male    DOB: July 06, 1956  Age: 61 y.o. MRN: [id number]  CC: No chief complaint on file.   HPI Johnny Mcknight presents for relapsed bad hiccups starting yesterday, n/v as well. C/o LBP, leg pain  Outpatient Medications Prior to Visit  Medication Sig Dispense Refill  . b complex vitamins tablet Take 1 tablet by mouth daily. 100 tablet 3  . chlorproMAZINE (THORAZINE) 10 MG tablet Take 1 tablet (10 mg total) by mouth 3 (three) times daily as needed. 90 tablet 0  . Cholecalciferol (VITAMIN D3) 2000 units capsule Take 1 capsule (2,000 Units total) by mouth daily. 100 capsule 3  . gabapentin (NEURONTIN) 100 MG capsule Take 1 capsule (100 mg total) by mouth 3 (three) times daily. 90 capsule 3  . potassium chloride SA (K-DUR,KLOR-CON) 20 MEQ tablet Take 1 tablet (20 mEq total) by mouth daily. 30 tablet 1   No facility-administered medications prior to visit.     ROS Review of Systems  Constitutional: Positive for fatigue. Negative for appetite change and unexpected weight change.  HENT: Negative for congestion, nosebleeds, sneezing, sore throat and trouble swallowing.   Eyes: Negative for itching and visual disturbance.  Respiratory: Negative for cough.   Cardiovascular: Negative for chest pain, palpitations and leg swelling.  Gastrointestinal: Positive for nausea and vomiting. Negative for abdominal distention, blood in stool and diarrhea.  Genitourinary: Negative for frequency and hematuria.  Musculoskeletal: Positive for gait problem. Negative for back pain, joint swelling and neck pain.  Skin: Negative for rash.  Neurological: Negative for dizziness, tremors, speech difficulty and weakness.  Psychiatric/Behavioral: Positive for decreased concentration and dysphoric mood. Negative for agitation and sleep disturbance. The patient is nervous/anxious.     Objective:  BP (!) 142/92 (BP Location: Left Arm, Patient Position: Sitting, Cuff Size: Normal)    Pulse 94   Temp 98.6 F (37 C) (Oral)   Ht 6' (1.829 m)   Wt 158 lb (71.7 kg)   SpO2 99%   BMI 21.43 kg/m   BP Readings from Last 3 Encounters:  02/23/17 (!) 142/92  12/02/16 120/88  08/10/16 130/82    Wt Readings from Last 3 Encounters:  02/23/17 158 lb (71.7 kg)  12/02/16 165 lb 4 oz (75 kg)  08/10/16 166 lb (75.3 kg)    Physical Exam  Constitutional: He is oriented to person, place, and time. He appears well-developed. No distress.  NAD  HENT:  Mouth/Throat: Oropharynx is clear and moist.  Eyes: Conjunctivae are normal. Pupils are equal, round, and reactive to light.  Neck: Normal range of motion. No JVD present. No thyromegaly present.  Cardiovascular: Normal rate, regular rhythm, normal heart sounds and intact distal pulses.  Exam reveals no gallop and no friction rub.   No murmur heard. Pulmonary/Chest: Effort normal and breath sounds normal. No respiratory distress. He has no wheezes. He has no rales. He exhibits no tenderness.  Abdominal: Soft. Bowel sounds are normal. He exhibits no distension and no mass. There is no tenderness. There is no rebound and no guarding.  Musculoskeletal: Normal range of motion. He exhibits tenderness. He exhibits no edema.  Lymphadenopathy:    He has no cervical adenopathy.  Neurological: He is alert and oriented to person, place, and time. He has normal reflexes. No cranial nerve deficit. He exhibits normal muscle tone. He displays a negative Romberg sign. Coordination and gait normal.  Skin: Skin is warm and dry. No rash noted.  Psychiatric: He has a  normal mood and affect. His behavior is normal. Judgment and thought content normal.  LS tender No tremor A/o/c   Lab Results  Component Value Date   WBC 5.8 12/02/2016   HGB 14.9 12/02/2016   HCT 43.5 12/02/2016   PLT 157.0 12/02/2016   GLUCOSE 78 12/02/2016   CHOL 160 06/09/2014   TRIG 80 06/09/2014   HDL 76 06/09/2014   LDLCALC 68 06/09/2014   ALT 34 12/02/2016   AST 129  (H) 12/02/2016   NA 135 12/02/2016   K 3.2 (L) 12/02/2016   CL 97 12/02/2016   CREATININE 0.73 12/02/2016   BUN 5 (L) 12/02/2016   CO2 25 12/02/2016   TSH 0.81 12/02/2016   PSA 2.14 10/15/2007   INR 0.91 09/06/2009   HGBA1C 5.7 09/15/2009    Dg Chest 2 View  Result Date: 11/17/2015 CLINICAL DATA:  Follow-up pneumonia EXAM: CHEST  2 VIEW COMPARISON:  09/22/2015 FINDINGS: Cardiomediastinal silhouette is stable. Small residual streaky infiltrate in right upper lobe lateral segment with improvement from prior exam. No new infiltrate or pulmonary edema. Bony thorax is stable. IMPRESSION: Small residual streaky infiltrate in right upper lobe laterally with improvement from prior exam. No new infiltrate or pulmonary edema. Bony thorax is stable. Electronically Signed   By: Lahoma Crocker M.D.   On: 11/17/2015 08:21    Assessment & Plan:   There are no diagnoses linked to this encounter. I am having Mr. Heyward maintain his gabapentin, b complex vitamins, Vitamin D3, potassium chloride SA, and chlorproMAZINE.  No orders of the defined types were placed in this encounter.    Follow-up: No Follow-up on file.  Walker Kehr, MD

## 2017-02-23 NOTE — Patient Instructions (Signed)
Go to ER if worse 

## 2017-02-23 NOTE — Assessment & Plan Note (Signed)
Zantac 300 mg q hs

## 2017-02-23 NOTE — Addendum Note (Signed)
Addended by: Karren Cobble on: 02/23/2017 11:33 AM   Modules accepted: Orders

## 2017-02-23 NOTE — Assessment & Plan Note (Addendum)
Thorazine prn Stop drinking Zofran for nausea IM

## 2017-03-23 ENCOUNTER — Ambulatory Visit (INDEPENDENT_AMBULATORY_CARE_PROVIDER_SITE_OTHER): Payer: Medicare HMO | Admitting: Internal Medicine

## 2017-03-23 ENCOUNTER — Encounter: Payer: Self-pay | Admitting: Internal Medicine

## 2017-03-23 DIAGNOSIS — R066 Hiccough: Secondary | ICD-10-CM | POA: Diagnosis not present

## 2017-03-23 DIAGNOSIS — G8929 Other chronic pain: Secondary | ICD-10-CM | POA: Diagnosis not present

## 2017-03-23 DIAGNOSIS — G621 Alcoholic polyneuropathy: Secondary | ICD-10-CM | POA: Insufficient documentation

## 2017-03-23 DIAGNOSIS — M545 Low back pain: Secondary | ICD-10-CM | POA: Diagnosis not present

## 2017-03-23 DIAGNOSIS — F1020 Alcohol dependence, uncomplicated: Secondary | ICD-10-CM

## 2017-03-23 DIAGNOSIS — K219 Gastro-esophageal reflux disease without esophagitis: Secondary | ICD-10-CM

## 2017-03-23 HISTORY — DX: Alcoholic polyneuropathy: G62.1

## 2017-03-23 MED ORDER — PROMETHAZINE HCL 25 MG PO TABS: 25.0000 mg | ORAL_TABLET | Freq: Three times a day (TID) | ORAL | 1 refills | Status: DC | PRN

## 2017-03-23 MED ORDER — GABAPENTIN 100 MG PO CAPS: 100.0000 mg | ORAL_CAPSULE | Freq: Three times a day (TID) | ORAL | 3 refills | Status: DC

## 2017-03-23 NOTE — Assessment & Plan Note (Signed)
Zantac 

## 2017-03-23 NOTE — Progress Notes (Signed)
Subjective:  Patient ID: Johnny Mcknight, male    DOB: 1956/01/14  Age: 61 y.o. MRN: [id number]  CC: No chief complaint on file.   HPI Blair Lundeen presents for GERD - better and hiccups - Thorazine was too $$$; better Eating better   Outpatient Medications Prior to Visit  Medication Sig Dispense Refill  . b complex vitamins tablet Take 1 tablet by mouth daily. 100 tablet 3  . Cholecalciferol (VITAMIN D3) 2000 units capsule Take 1 capsule (2,000 Units total) by mouth daily. 100 capsule 3  . gabapentin (NEURONTIN) 100 MG capsule Take 1 capsule (100 mg total) by mouth 3 (three) times daily. 90 capsule 3  . potassium chloride SA (K-DUR,KLOR-CON) 20 MEQ tablet Take 1 tablet (20 mEq total) by mouth daily. 30 tablet 11  . ranitidine (ZANTAC) 300 MG tablet Take 1 tablet (300 mg total) by mouth at bedtime. 30 tablet 5  . chlorproMAZINE (THORAZINE) 10 MG tablet Take 1 tablet (10 mg total) by mouth 3 (three) times daily as needed. 90 tablet 0   No facility-administered medications prior to visit.     ROS Review of Systems  Constitutional: Negative for appetite change, fatigue and unexpected weight change.  HENT: Negative for congestion, nosebleeds, sneezing, sore throat and trouble swallowing.   Eyes: Negative for itching and visual disturbance.  Respiratory: Negative for cough.   Cardiovascular: Negative for chest pain, palpitations and leg swelling.  Gastrointestinal: Positive for nausea. Negative for abdominal distention, blood in stool and diarrhea.  Genitourinary: Negative for frequency and hematuria.  Musculoskeletal: Negative for back pain, gait problem, joint swelling and neck pain.  Skin: Negative for rash.  Neurological: Negative for dizziness, tremors, speech difficulty and weakness.  Psychiatric/Behavioral: Negative for agitation, dysphoric mood and sleep disturbance. The patient is not nervous/anxious.     Objective:  BP 126/82 (BP Location: Left Arm, Patient Position: Sitting,  Cuff Size: Normal)   Pulse 88   Temp 98.1 F (36.7 C) (Oral)   Ht 6' (1.829 m)   Wt 160 lb (72.6 kg)   SpO2 100%   BMI 21.70 kg/m   BP Readings from Last 3 Encounters:  03/23/17 126/82  02/23/17 (!) 142/92  12/02/16 120/88    Wt Readings from Last 3 Encounters:  03/23/17 160 lb (72.6 kg)  02/23/17 158 lb (71.7 kg)  12/02/16 165 lb 4 oz (75 kg)    Physical Exam  Constitutional: He is oriented to person, place, and time. He appears well-developed. No distress.  NAD  HENT:  Mouth/Throat: Oropharynx is clear and moist.  Eyes: Conjunctivae are normal. Pupils are equal, round, and reactive to light.  Neck: Normal range of motion. No JVD present. No thyromegaly present.  Cardiovascular: Normal rate, regular rhythm, normal heart sounds and intact distal pulses.  Exam reveals no gallop and no friction rub.   No murmur heard. Pulmonary/Chest: Effort normal and breath sounds normal. No respiratory distress. He has no wheezes. He has no rales. He exhibits no tenderness.  Abdominal: Soft. Bowel sounds are normal. He exhibits no distension and no mass. There is no tenderness. There is no rebound and no guarding.  Musculoskeletal: Normal range of motion. He exhibits no edema or tenderness.  Lymphadenopathy:    He has no cervical adenopathy.  Neurological: He is alert and oriented to person, place, and time. He has normal reflexes. No cranial nerve deficit. He exhibits normal muscle tone. He displays a negative Romberg sign. Coordination and gait normal.  Skin: Skin is warm  and dry. No rash noted.  Psychiatric: He has a normal mood and affect. His behavior is normal. Judgment and thought content normal.    Lab Results  Component Value Date   WBC 6.1 02/23/2017   HGB 12.3 (L) 02/23/2017   HCT 36.2 (L) 02/23/2017   PLT 188.0 02/23/2017   GLUCOSE 89 02/23/2017   CHOL 160 06/09/2014   TRIG 80 06/09/2014   HDL 76 06/09/2014   LDLCALC 68 06/09/2014   ALT 11 02/23/2017   AST 51 (H)  02/23/2017   NA 142 02/23/2017   K 3.3 (L) 02/23/2017   CL 101 02/23/2017   CREATININE 0.79 02/23/2017   BUN 5 (L) 02/23/2017   CO2 25 02/23/2017   TSH 0.81 12/02/2016   PSA 2.14 10/15/2007   INR 0.91 09/06/2009   HGBA1C 5.7 09/15/2009    Dg Chest 2 View  Result Date: 11/17/2015 CLINICAL DATA:  Follow-up pneumonia EXAM: CHEST  2 VIEW COMPARISON:  09/22/2015 FINDINGS: Cardiomediastinal silhouette is stable. Small residual streaky infiltrate in right upper lobe lateral segment with improvement from prior exam. No new infiltrate or pulmonary edema. Bony thorax is stable. IMPRESSION: Small residual streaky infiltrate in right upper lobe laterally with improvement from prior exam. No new infiltrate or pulmonary edema. Bony thorax is stable. Electronically Signed   By: Lahoma Crocker M.D.   On: 11/17/2015 08:21    Assessment & Plan:   There are no diagnoses linked to this encounter. I have discontinued Mr. Fenn chlorproMAZINE. I am also having him maintain his gabapentin, Vitamin D3, b complex vitamins, ranitidine, and potassium chloride SA.  No orders of the defined types were placed in this encounter.    Follow-up: No Follow-up on file.  Walker Kehr, MD

## 2017-03-23 NOTE — Patient Instructions (Signed)
MC well w/Jill 

## 2017-03-23 NOTE — Assessment & Plan Note (Signed)
Gabapentin prn 

## 2017-03-23 NOTE — Assessment & Plan Note (Signed)
Gabapentin prn B complex po 

## 2017-03-23 NOTE — Assessment & Plan Note (Signed)
Discussed.

## 2017-03-23 NOTE — Assessment & Plan Note (Signed)
Rx for thorazine was too $$$ Promethazine prn Treat GERD Drink less

## 2017-04-06 ENCOUNTER — Emergency Department (HOSPITAL_COMMUNITY)
Admission: EM | Admit: 2017-04-06 | Discharge: 2017-04-07 | Disposition: A | Discharge status: Home / Self Care | Payer: Medicare HMO | Attending: Emergency Medicine | Admitting: Emergency Medicine

## 2017-04-06 ENCOUNTER — Emergency Department (HOSPITAL_COMMUNITY): Payer: Medicare HMO

## 2017-04-06 DIAGNOSIS — R202 Paresthesia of skin: Secondary | ICD-10-CM | POA: Diagnosis present

## 2017-04-06 DIAGNOSIS — J449 Chronic obstructive pulmonary disease, unspecified: Secondary | ICD-10-CM | POA: Diagnosis not present

## 2017-04-06 DIAGNOSIS — R200 Anesthesia of skin: Secondary | ICD-10-CM

## 2017-04-06 DIAGNOSIS — G621 Alcoholic polyneuropathy: Secondary | ICD-10-CM | POA: Insufficient documentation

## 2017-04-06 DIAGNOSIS — G629 Polyneuropathy, unspecified: Secondary | ICD-10-CM

## 2017-04-06 DIAGNOSIS — Z79899 Other long term (current) drug therapy: Secondary | ICD-10-CM | POA: Diagnosis not present

## 2017-04-06 DIAGNOSIS — Z87891 Personal history of nicotine dependence: Secondary | ICD-10-CM | POA: Diagnosis not present

## 2017-04-06 LAB — BASIC METABOLIC PANEL
ANION GAP: 20 — AB (ref 5–15)
BUN: 6 mg/dL (ref 6–20)
CO2: 18 mmol/L — ABNORMAL LOW (ref 22–32)
Calcium: 8.9 mg/dL (ref 8.9–10.3)
Chloride: 92 mmol/L — ABNORMAL LOW (ref 101–111)
Creatinine, Ser: 1.1 mg/dL (ref 0.61–1.24)
GFR calc Af Amer: >60 mL/min (ref >60)
Glucose, Bld: 107 mg/dL — ABNORMAL HIGH (ref 65–99)
POTASSIUM: 3.4 mmol/L — AB (ref 3.5–5.1)
SODIUM: 130 mmol/L — AB (ref 135–145)

## 2017-04-06 LAB — ETHANOL: ALCOHOL ETHYL (B): 321 mg/dL — AB (ref <5)

## 2017-04-06 LAB — CBC WITH DIFFERENTIAL/PLATELET
BASOS ABS: 0 10*3/uL (ref 0.0–0.1)
BASOS PCT: 0 %
EOS ABS: 0 10*3/uL (ref 0.0–0.7)
EOS PCT: 0 %
HCT: 32.7 % — ABNORMAL LOW (ref 39.0–52.0)
Hemoglobin: 11.7 g/dL — ABNORMAL LOW (ref 13.0–17.0)
Lymphocytes Relative: 24 %
Lymphs Abs: 1.2 10*3/uL (ref 0.7–4.0)
MCH: 34.1 pg — ABNORMAL HIGH (ref 26.0–34.0)
MCHC: 35.8 g/dL (ref 30.0–36.0)
MCV: 95.3 fL (ref 78.0–100.0)
Monocytes Absolute: 0.3 10*3/uL (ref 0.1–1.0)
Monocytes Relative: 6 %
Neutro Abs: 3.7 10*3/uL (ref 1.7–7.7)
Neutrophils Relative %: 70 %
PLATELETS: 146 10*3/uL — AB (ref 150–400)
RBC: 3.43 MIL/uL — AB (ref 4.22–5.81)
RDW: 14.8 % (ref 11.5–15.5)
WBC: 5.3 10*3/uL (ref 4.0–10.5)

## 2017-04-06 MED ORDER — LORAZEPAM 2 MG/ML IJ SOLN
1.0000 mg | Freq: Once | INTRAMUSCULAR | Status: AC
  Administered 2017-04-06: 1 mg via INTRAVENOUS
  Filled 2017-04-06: qty 1

## 2017-04-06 NOTE — ED Provider Notes (Signed)
[facility] DEPT Provider Note   CSN: [id number] Arrival date & time: 04/06/17  2113     History   Chief Complaint Chief Complaint  Patient presents with  . Numbness    HPI Johnny Mcknight is a 61 y.o. male with history of neuropathy and alcoholism who is coming in today with onset of bilateral upper extremity numbness. Patient said earlier he felt like his legs gave out and he slid to the ground and laid down for a while and then when he tried to get up he noticed his bilateral upper extremities were very weak and "numb."  Patient states it has improved and his upper arms but is now only in his hands bilaterally. Does endorse drinking a pint of vodka today. No trauma or other inciting events. Endorses bilateral lower extremity numbness and tingling as well however states that has been chronic and he is on gabapentin for that. Has not tried anything for the numbness. Nothing makes it better or worse. No nausea, vomiting, chest pain, shortness of breath, abdominal pain.  HPI  No past medical history on file.  Patient Active Problem List   Diagnosis Date Noted  . Neuropathy, alcoholic ([place]) 93/81/8299  . Neuropathy 12/02/2016  . Grief 12/02/2016  . Hiccups 08/10/2016  . Stress at work 08/10/2016  . Neck pain 06/08/2016  . Noncompliance 06/08/2016  . Knee pain, bilateral 02/24/2016  . Elevated BP 02/24/2016  . 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 CAP (community acquired pneumonia)   . Streptococcal pneumonia ([place]) 09/27/2015  . Bacteremia due to Streptococcus pneumoniae 09/27/2015  . Acute respiratory failure with hypoxia ([place]) 09/27/2015  . Alcoholism ([place]) 09/27/2015  . Anemia of chronic disease 09/27/2015  . Thrombocytopenia ([place]) 09/27/2015  . Hypokalemia 09/22/2015  . Hypomagnesemia 09/22/2015  . Lumbago 05/10/2013  . GERD 10/15/2007    Past Surgical History:  Procedure Laterality Date  . TEE WITHOUT CARDIOVERSION N/A 09/29/2015   Procedure: TRANSESOPHAGEAL ECHOCARDIOGRAM  (TEE);  Surgeon: Jerline Pain, MD;  Location: [facility] ENDOSCOPY;  Service: Cardiovascular;  Laterality: N/A;       Home Medications    Prior to Admission medications   Medication Sig Start Date End Date Taking? Authorizing Provider  b complex vitamins tablet Take 1 tablet by mouth daily. 02/23/17   Plotnikov, Evie Lacks, MD  Cholecalciferol (VITAMIN D3) 2000 units capsule Take 1 capsule (2,000 Units total) by mouth daily. 12/02/16   Plotnikov, Evie Lacks, MD  gabapentin (NEURONTIN) 100 MG capsule Take 1 capsule (100 mg total) by mouth 3 (three) times daily. 03/23/17   Plotnikov, Evie Lacks, MD  potassium chloride SA (K-DUR,KLOR-CON) 20 MEQ tablet Take 1 tablet (20 mEq total) by mouth daily. 02/23/17   Plotnikov, Evie Lacks, MD  promethazine (PHENERGAN) 25 MG tablet Take 1 tablet (25 mg total) by mouth every 8 (eight) hours as needed for nausea or vomiting. Use for hiccups prn. 03/23/17   Plotnikov, Evie Lacks, MD  ranitidine (ZANTAC) 300 MG tablet Take 1 tablet (300 mg total) by mouth at bedtime. 02/23/17   Plotnikov, Evie Lacks, MD    Family History Family History  Problem Relation Age of Onset  . Hypertension Other   . Heart disease Father 70       MI  . Hypertension Father   . Hypertension Mother   . Diabetes Mother   . Diabetes Maternal Aunt   . Stroke Maternal Aunt   . Diabetes Maternal Grandmother     Social History Social History  Substance Use Topics  .  Smoking status: Former Smoker    Types: Pipe  . Smokeless tobacco: Never Used  . Alcohol use 0.0 oz/week     Comment: vodka 1/2 pint a day     Allergies   Doxycycline; Omeprazole-sodium bicarbonate; and Propranolol   Review of Systems Review of Systems  Constitutional: Negative for activity change and fatigue.  HENT: Negative for facial swelling.   Eyes: Negative for visual disturbance.  Respiratory: Negative for cough, shortness of breath and wheezing.   Cardiovascular: Negative for chest pain.  Gastrointestinal: Negative for  abdominal distention, diarrhea, nausea and vomiting.  Endocrine: Negative for polyuria.  Genitourinary: Negative for dysuria and hematuria.  Musculoskeletal: Positive for neck pain. Negative for arthralgias.  Skin: Negative for wound.  Allergic/Immunologic: Negative for immunocompromised state.  Neurological: Negative for light-headedness and headaches.  Psychiatric/Behavioral: Negative for agitation and behavioral problems.     Physical Exam Updated Vital Signs BP (!) 145/91   Pulse 89   Ht 6' (1.829 m)   Wt 72.6 kg (160 lb)   SpO2 100%   BMI 21.70 kg/m   Physical Exam  Constitutional: He is oriented to person, place, and time. He appears well-developed and well-nourished. No distress.  HENT:  Head: Normocephalic and atraumatic.  Mouth/Throat: Oropharynx is clear and moist.  Eyes: Pupils are equal, round, and reactive to light. Conjunctivae and EOM are normal.  Neck: No tracheal deviation present.  Patient in c-collar  Cardiovascular: Normal rate and intact distal pulses.   Pulmonary/Chest: Effort normal.  Abdominal: Soft. He exhibits no distension. There is no tenderness. There is no rebound.  Musculoskeletal: He exhibits no deformity.  Neurological: He is alert and oriented to person, place, and time. A sensory deficit is present. No cranial nerve deficit. He exhibits normal muscle tone. Coordination normal.  Patient endorses tingling in the bilateral hands with some weakness to grip strength that is equal bilaterally  Skin: Skin is warm and dry. He is not diaphoretic.  Psychiatric: He has a normal mood and affect.     ED Treatments / Results  Labs (all labs ordered are listed, but only abnormal results are displayed) Labs Reviewed  CBC WITH DIFFERENTIAL/PLATELET - Abnormal; Notable for the following:       Result Value   RBC 3.43 (*)    Hemoglobin 11.7 (*)    HCT 32.7 (*)    MCH 34.1 (*)    Platelets 146 (*)    All other components within normal limits  BASIC  METABOLIC PANEL - Abnormal; Notable for the following:    Sodium 130 (*)    Potassium 3.4 (*)    Chloride 92 (*)    CO2 18 (*)    Glucose, Bld 107 (*)    Anion gap 20 (*)    All other components within normal limits  ETHANOL - Abnormal; Notable for the following:    Alcohol, Ethyl (B) 321 (*)    All other components within normal limits    EKG  EKG Interpretation  Date/Time:  Thursday April 06 2017 22:01:48 EDT Ventricular Rate:  89 PR Interval:    QRS Duration: 102 QT Interval:  392 QTC Calculation: 477 R Axis:   79 Text Interpretation:  Sinus rhythm Prolonged PR interval RSR' in V1 or V2, probably normal variant Borderline prolonged QT interval no acute changes  Confirmed by Brantley Stage [phone]) on 04/06/2017 10:56:41 PM       Radiology No results found.  Procedures Procedures (including critical care time)  Medications Ordered  in ED Medications  LORazepam (ATIVAN) injection 1 mg (1 mg Intravenous Given 04/06/17 2341)     Initial Impression / Assessment and Plan / ED Course  I have reviewed the triage vital signs and the nursing notes.  Pertinent labs & imaging results that were available during my care of the patient were reviewed by me and considered in my medical decision making (see chart for details).     Patient presenting today with upper extremity numbness. No trauma or other inciting event. Patient will withdraw from pain in the bilateral upper extremities but will not squeeze my fingers or reliably follow other voluntary commands. Does endorse some mild C-spine pain, patient already with c-collar in place.  No leukocytosis. Slightly hyponatremic and alcohol level 321. Patient counseled on cessation of alcohol use.  MRI of the C-spine was ordered and despite giving 2 mg Ativan before the imaging, patient did not tolerate getting the MRI and was reported to have been moving and pulling off leads and being noncompliant with the exam. Upon reevaluation of the  patient when he returned from MRI, he refused any further imaging despite thorough discussion of risk vs. benefits. Reexamination of the strength in his hands does appear to be improved from prior examination however will not fully squeeze my fingers. C-collar was kept in place and he was instructed to follow with neurology tomorrow for further evaluation, and his primary care physician in the next few days for recheck and c-collar clearance. Given the number for neurology. He voiced understanding and agreement, he was given strict return precautions, and he was discharged in stable condition. Ambulating at time of discharge and clinically sober.  Patient was seen with my attending, Dr. Oleta Mouse, who voiced agreement and oversaw the evaluation and treatment of this patient.   [employer] seismologist was used in the creation of this note. If there are any errors or inconsistencies needing clarification, please contact me directly.   Final Clinical Impressions(s) / ED Diagnoses   Final diagnoses:  Numbness  Neuropathy    New Prescriptions New Prescriptions   No medications on file     Valda Lamb, MD 04/07/17 3500    Forde Dandy, MD 04/07/17 [phone]

## 2017-04-06 NOTE — ED Triage Notes (Signed)
Pt arrived from home via EMS with reports of numbness to bilateral upper and lower extremities.ETOH on board.

## 2017-04-06 NOTE — ED Notes (Signed)
Patient transported to MRI 

## 2017-04-07 NOTE — ED Notes (Signed)
Pt unable to complete MRI due to claustrophobia. Pt refusing to stay in scanner. EDP aware.

## 2017-04-07 NOTE — ED Notes (Signed)
Spoke with pt Sister who states she is coming to pick pt up.

## 2017-05-16 ENCOUNTER — Encounter: Payer: Self-pay | Admitting: Neurology

## 2017-05-16 ENCOUNTER — Ambulatory Visit (INDEPENDENT_AMBULATORY_CARE_PROVIDER_SITE_OTHER): Payer: Medicare HMO | Admitting: Neurology

## 2017-05-16 VITALS — BP 132/89 | HR 117 | Ht 72.0 in | Wt 151.0 lb

## 2017-05-16 DIAGNOSIS — D539 Nutritional anemia, unspecified: Secondary | ICD-10-CM | POA: Diagnosis not present

## 2017-05-16 DIAGNOSIS — G621 Alcoholic polyneuropathy: Secondary | ICD-10-CM | POA: Diagnosis not present

## 2017-05-16 DIAGNOSIS — F10288 Alcohol dependence with other alcohol-induced disorder: Secondary | ICD-10-CM

## 2017-05-16 DIAGNOSIS — M6259 Muscle wasting and atrophy, not elsewhere classified, multiple sites: Secondary | ICD-10-CM | POA: Diagnosis not present

## 2017-05-16 NOTE — Patient Instructions (Signed)
Alcohol Abuse and Nutrition Alcohol abuse is any pattern of alcohol consumption that harms your health, relationships, or work. Alcohol abuse can affect how your body breaks down and absorbs nutrients from food by causing your liver to work abnormally. Additionally, many people who abuse alcohol do not eat enough carbohydrates, protein, fat, vitamins, and minerals. This can cause poor nutrition (malnutrition) and a lack of nutrients (nutrient deficiencies), which can lead to further complications. Nutrients that are commonly lacking (deficient) among people who abuse alcohol include:  Vitamins. ? Vitamin A. This is stored in your liver. It is important for your vision, metabolism, and ability to fight off infections (immunity). ? B vitamins. These include vitamins such as folate, thiamin, and niacin. These are important in new cell growth and maintenance. ? Vitamin C. This plays an important role in iron absorption, wound healing, and immunity. ? Vitamin D. This is produced by your liver, but you can also get vitamin D from food. Vitamin D is necessary for your body to absorb and use calcium.  Minerals. ? Calcium. This is important for your bones and your heart and blood vessel (cardiovascular) function. ? Iron. This is important for blood, muscle, and nervous system functioning. ? Magnesium. This plays an important role in muscle and nerve function, and it helps to control blood sugar and blood pressure. ? Zinc. This is important for the normal function of your nervous system and digestive system (gastrointestinal tract).  Nutrition is an essential component of therapy for alcohol abuse. Your health care provider or dietitian will work with you to design a plan that can help restore nutrients to your body and prevent potential complications. What is my plan? Your dietitian may develop a specific diet plan that is based on your condition and any other complications you may have. A diet plan will  commonly include:  A balanced diet. ? Grains: 6-8 oz per day. ? Vegetables: 2-3 cups per day. ? Fruits: 1-2 cups per day. ? Meat and other protein: 5-6 oz per day. ? Dairy: 2-3 cups per day.  Vitamin and mineral supplements.  What do I need to know about alcohol and nutrition?  Consume foods that are high in antioxidants, such as grapes, berries, nuts, green tea, and dark green and orange vegetables. This can help to counteract some of the stress that is placed on your liver by consuming alcohol.  Avoid food and drinks that are high in fat and sugar. Foods such as sugared soft drinks, salty snack foods, and candy contain empty calories. This means that they lack important nutrients such as protein, fiber, and vitamins.  Eat frequent meals and snacks. Try to eat 5-6 small meals each day.  Eat a variety of fresh fruits and vegetables each day. This will help you get plenty of water, fiber, and vitamins in your diet.  Drink plenty of water and other clear fluids. Try to drink at least 48-64 oz (1.5-2 L) of water per day.  If you are a vegetarian, eat a variety of protein-rich foods. Pair whole grains with plant-based proteins at meals and snacks to obtain the greatest nutrient benefit from your food. For example, eat rice with beans, put peanut butter on whole-grain toast, or eat oatmeal with sunflower seeds.  Soak beans and whole grains overnight before cooking. This can help your body to absorb the nutrients more easily.  Include foods fortified with vitamins and minerals in your diet. Commonly fortified foods include milk, orange juice, cereal, and bread.  If you are malnourished, your dietitian may recommend a high-protein, high-calorie diet. This may include: ? 2,000-3,000 calories (kilocalories) per day. ? 70-100 grams of protein per day.  Your health care provider may recommend a complete nutritional supplement beverage. This can help to restore calories, protein, and vitamins to  your body. Depending on your condition, you may be advised to consume this instead of or in addition to meals.  Limit your intake of caffeine. Replace drinks like coffee and black tea with decaffeinated coffee and herbal tea.  Eat a variety of foods that are high in omega fatty acids. These include fish, nuts and seeds, and soybeans. These foods may help your liver to recover and may also stabilize your mood.  Certain medicines may cause changes in your appetite, taste, and weight. Work with your health care provider and dietitian to make any adjustments to your medicines and diet plan.  Include other healthy lifestyle choices in your daily routine. ? Be physically active. ? Get enough sleep. ? Spend time doing activities that you enjoy.  If you are unable to take in enough food and calories by mouth, your health care provider may recommend a feeding tube. This is a tube that passes through your nose and throat, directly into your stomach. Nutritional supplement beverages can be given to you through the feeding tube to help you get the nutrients you need.  Take vitamin or mineral supplements as recommended by your health care provider. What foods can I eat? Grains Enriched pasta. Enriched rice. Fortified whole-grain bread. Fortified whole-grain cereal. Barley. Brown rice. Quinoa. [place]. Vegetables All fresh, frozen, and canned vegetables. Spinach. Kale. Artichoke. Carrots. Winter squash and pumpkin. Sweet potatoes. Broccoli. Cabbage. Cucumbers. Tomatoes. Sweet peppers. Green beans. Peas. Corn. Fruits All fresh and frozen fruits. Berries. Grapes. Mango. Papaya. Guava. Cherries. Apples. Bananas. Peaches. Plums. Pineapple. Watermelon. Cantaloupe. Oranges. Avocado. Meats and Other Protein Sources Beef liver. Lean beef. Pork. Fresh and canned chicken. Fresh fish. Oysters. Sardines. Canned tuna. Shrimp. Eggs with yolks. Nuts and seeds. Peanut butter. Beans and lentils. Soybeans.  Tofu. Dairy Whole, low-fat, and nonfat milk. Whole, low-fat, and nonfat yogurt. Cottage cheese. Sour cream. Hard and soft cheeses. Beverages Water. Herbal tea. Decaffeinated coffee. Decaffeinated green tea. 100% fruit juice. 100% vegetable juice. Instant breakfast shakes. Condiments Ketchup. Mayonnaise. Mustard. Salad dressing. Barbecue sauce. Sweets and Desserts Sugar-free ice cream. Sugar-free pudding. Sugar-free gelatin. Fats and Oils Butter. Vegetable oil, flaxseed oil, olive oil, and walnut oil. Other Complete nutrition shakes. Protein bars. Sugar-free gum. The items listed above may not be a complete list of recommended foods or beverages. Contact your dietitian for more options. What foods are not recommended? Grains Sugar-sweetened breakfast cereals. Flavored instant oatmeal. Fried breads. Vegetables Breaded or deep-fried vegetables. Fruits Dried fruit with added sugar. Candied fruit. Canned fruit in syrup. Meats and Other Protein Sources Breaded or deep-fried meats. Dairy Flavored milks. Fried cheese curds or fried cheese sticks. Beverages Alcohol. Sugar-sweetened soft drinks. Sugar-sweetened tea. Caffeinated coffee and tea. Condiments Sugar. Honey. Agave nectar. Molasses. Sweets and Desserts Chocolate. Cake. Cookies. Candy. Other Potato chips. Pretzels. Salted nuts. Candied nuts. The items listed above may not be a complete list of foods and beverages to avoid. Contact your dietitian for more information. This information is not intended to replace advice given to you by your health care provider. Make sure you discuss any questions you have with your health care provider. Document Released: 06/30/2005 Document Revised: 01/13/2016 Document Reviewed: 04/08/2014 Elsevier Interactive Patient Education  2018 [employer]  

## 2017-05-16 NOTE — Progress Notes (Signed)
Provider:  Larey Seat, M D  Referring Provider: Cassandria Anger, MD Primary Care Physician:  Cassandria Anger, MD  Chief Complaint  Patient presents with  . New Patient (Initial Visit)    pt had a fall and the pt was complaining of bilateral hands and legs numbness. EMS took to the hospital and were checked out and instucted to follow up in the office here.     HPI:  Johnny Mcknight is a 61 y.o. male  Is seen here as a referral/ revisit  from Dr. Alain Marion for neuropathy.  [name] is a 61 year old African American gentleman followed by Dr. Dorann Lodge, for primary care needs. On July 19 he had fallen when he entered his home and said that was not the first time. Over the last 3 or 4 months there have been multiple falls, the patient was intoxicated at the time he reported that his upper extremities were very weak and numb mesh and his lower extremities just gave out on him their number is well. There was no trauma or any other external event. Numbness and tingling in the lower extremities could be easily explained and nutritional deficiencies vitamin deficiencies he had no chest pain but abdominal pain, and he states that he frequently is nauseated and vomits.  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 This was in January 2017. The patient also was well known to the emergency room. He had been treated for acute respiratory failure with hypoxemia, related to EtOH intoxication, anemia of deficiency, thrombocytopenia of deficiency, hypokalemia and hypomagnesemia as well as GERD. Current medications include vitamin B complex, vitamin D 3, gabapentin 3 times a day, potassium supplement, Phenergan 25 mg up to 3 times a day for nausea and vomiting, ranitidine for the stomach to be taken at bedtime.  All medications are prescribed by primary care physician Dr. Alain Marion .  The patient has a family history of strokes and diabetes in the maternal family  He had a borderline prolonged QT interval of 04/06/2017 EKG in the emergency room.  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 Single. Widowed. no children. he is a former smoker used types smoking. Alcohol use varies, he drinks most days, and mostly vodka. HS graduate .   Review of Systems: Out of a complete 14 system review, the patient complains of only the following symptoms, and all other reviewed systems are negative.    Social History   Social History  . Marital status: Widowed    Spouse name: N/A  . Number of children: N/A  . Years of education: N/A   Occupational History  . Disabled.    Social History Main Topics  . Smoking status: Former Smoker    Types: Pipe  . Smokeless tobacco: Never Used  . Alcohol use 0.0 oz/week     Comment: vodka 1/2 pint a day  . Drug use: No  . Sexual activity: Yes   Other Topics Concern  . Not on file   Social History Narrative   Lives with wife.      Family History  Problem Relation Age of Onset  . Hypertension Other   . Heart disease Father 60       MI  . Hypertension Father   .  Hypertension Mother   . Diabetes Mother   . Diabetes Maternal Aunt   . Stroke Maternal Aunt   . Diabetes Maternal Grandmother     No past medical history on file.  Past Surgical History:  Procedure Laterality Date  . TEE WITHOUT CARDIOVERSION N/A 09/29/2015   Procedure: TRANSESOPHAGEAL ECHOCARDIOGRAM (TEE);  Surgeon: Jerline Pain, MD;  Location: [facility] ENDOSCOPY;  Service: Cardiovascular;  Laterality: N/A;    Current Outpatient Prescriptions  Medication Sig Dispense Refill  . gabapentin (NEURONTIN) 100 MG capsule Take 1 capsule (100 mg total) by mouth 3 (three) times daily. 90 capsule 3  . promethazine (PHENERGAN) 25 MG tablet Take 1  tablet (25 mg total) by mouth every 8 (eight) hours as needed for nausea or vomiting. Use for hiccups prn. 30 tablet 1  . ranitidine (ZANTAC) 300 MG tablet Take 1 tablet (300 mg total) by mouth at bedtime. 30 tablet 5   No current facility-administered medications for this visit.     Allergies as of 05/16/2017 - Review Complete 05/16/2017  Allergen Reaction Noted  . Doxycycline  11/19/2008  . Omeprazole-sodium bicarbonate  01/29/2008  . Propranolol  06/03/2013    Vitals: BP 132/89   Pulse (!) 117   Ht 6' (1.829 m)   Wt 151 lb (68.5 kg)   BMI 20.48 kg/m  Last Weight:  Wt Readings from Last 1 Encounters:  05/16/17 151 lb (68.5 kg)   Last Height:   Ht Readings from Last 1 Encounters:  05/16/17 6' (1.829 m)    Physical exam:  General: The patient is awake, alert and appears not in acute distress. He is hoarse and jittery  Head: Normocephalic, atraumatic. Neck is supple. Mallampati 4, macroglossia,and poor dentition.  Cardiovascular:  Regular rate and rhythm , without  murmurs or carotid bruit, and without distended neck veins. Respiratory: Lungs are clear to auscultation. Skin:  Without evidence of edema, or rash Trunk: BMI is low -  patient  has stooped posture.  Neurologic exam : The patient is awake and alert, oriented to place and time.  Memory subjective  described as impaired - There is a normal attention span & concentration ability.  Cranial nerves: Pupils are equal and briskly reactive to light. Funduscopic exam without  evidence of pallor or edema. Extraocular movements  in vertical and horizontal planes our abnormal, several beat nystagmus to the left and right. Question of wernicke -Hearing to finger rub intact.  Facial sensation intact to fine touch. Facial motor strength is symmetric and tongue and uvula move midline. Tongue protrusion into either cheek is normal. Shoulder shrug is normal.   Motor exam:  Normal tone , atrophic muscle bulk and symmetric loss of   strength in all extremities. He lost grip strength   Sensory:  Fine touch, pinprick and vibration were reduced in feet and hands. Coordination: Rapid alternating movements in the fingers/hands were slowed  Finger-to-nose maneuver  With  evidence of ataxia, dysmetria - overshooting movements. .he lost grip -  Gait and station: Patient walks without assistive device and is able unassisted to climb up to the exam table. Bilateral mild foot drop, abnormal tapping while walking. Stalk-like gait, massive atrophy of the quadriceps muscles in both eyes, turns with 4-5 steps.  Deep tendon reflexes: in the  upper  extremities are symmetric and intact. The lower extremity DTRs are actually very brisk - unusual for a polyneuropathy/ Babinski maneuver response is  downgoing.   Assessment:  After physical and neurologic examination, review of  laboratory studies, imaging, neurophysiology testing and pre-existing records, assessment is that of :   Alcoholic neuropathy with nutritional deficiency, muscle atrophy and ataxia during  Intoxication. He is malnourished, he admits to not eating well.   He has habits blood tests during his emergency room visits, which confirm anemia hemoglobin was 11.7 hematocrit 32.7, platelet count was 140,000, sodium was 130, potassium 3.4, our goal was 321. He was given Ativan in the emergency room. An MRI of the spine was attempted and despite giving 2 mg of Ativan before the imaging he could not tolerate it. I think it is very hard to do this was an intoxicated patient. I will be happy to order the MRI of the C-spine but I will first order a nerve conduction study EMG of the upper extremities and lower extremities to see how advanced his neuropathy is.  His numbness and weakness are part of this well known and previously diagnosed process.  Plan:   EMG and NCV upper and lower extremities.  MRI  spine postponed due to claustrophobia-  Consider CT c spine and thoracis spine instead.  After Muscle and nerve testing.   Referral to [facility] declined .     Asencion Partridge Korynn Kenedy MD 05/16/2017

## 2017-06-06 ENCOUNTER — Encounter: Payer: Medicare HMO | Admitting: Neurology

## 2017-06-12 ENCOUNTER — Encounter: Payer: Medicare HMO | Admitting: Neurology

## 2017-06-12 ENCOUNTER — Telehealth: Payer: Self-pay | Admitting: Neurology

## 2017-06-12 NOTE — Telephone Encounter (Signed)
This patient cancelled same day of appointment, claims to be sick.

## 2017-06-13 ENCOUNTER — Encounter: Payer: Self-pay | Admitting: Neurology

## 2017-06-20 ENCOUNTER — Encounter: Payer: Medicare HMO | Admitting: Neurology

## 2017-07-03 ENCOUNTER — Ambulatory Visit (INDEPENDENT_AMBULATORY_CARE_PROVIDER_SITE_OTHER): Payer: Self-pay | Admitting: Neurology

## 2017-07-03 ENCOUNTER — Ambulatory Visit (INDEPENDENT_AMBULATORY_CARE_PROVIDER_SITE_OTHER): Payer: Medicare HMO | Admitting: Neurology

## 2017-07-03 ENCOUNTER — Encounter: Payer: Self-pay | Admitting: Neurology

## 2017-07-03 DIAGNOSIS — D539 Nutritional anemia, unspecified: Secondary | ICD-10-CM

## 2017-07-03 DIAGNOSIS — F10288 Alcohol dependence with other alcohol-induced disorder: Secondary | ICD-10-CM

## 2017-07-03 DIAGNOSIS — M6259 Muscle wasting and atrophy, not elsewhere classified, multiple sites: Secondary | ICD-10-CM

## 2017-07-03 DIAGNOSIS — G621 Alcoholic polyneuropathy: Secondary | ICD-10-CM

## 2017-07-03 NOTE — Procedures (Signed)
     HISTORY:  Johnny Mcknight is a 61 year old gentleman with a history of alcohol overuse and onset of numbness in the feet and hands. This began about one year ago, this is associated with a gait disorder. The patient is being evaluated for a possible peripheral neuropathy.  NERVE CONDUCTION STUDIES:  Nerve conduction studies were performed on both upper extremities. The distal motor latencies and motor amplitudes for the median nerves bilaterally and for the left ulnar nerve were normal. Slowing was seen for the left ulnar nerve above and below the elbow, but nerve conduction velocities were normal for the median nerves bilaterally. The sensory latencies for the median nerves were slightly prolonged bilaterally, normal for the ulnar nerves bilaterally. The F wave latencies for the median and ulnar nerves were slightly prolonged bilaterally.  Nerve conduction studies were performed on both lower extremities. No response was seen for the peroneal or posterior tibial nerves bilaterally. The sensory latencies for the peroneal nerves were unobtainable bilaterally.  EMG STUDIES:  EMG study was performed on the right lower extremity:  The tibialis anterior muscle reveals 2 to 4K motor units with full recruitment. No fibrillations or positive waves were seen. The peroneus tertius muscle reveals 2 to 4K motor units with slightly decreased recruitment. No fibrillations or positive waves were seen. The medial gastrocnemius muscle reveals 1 to 3K motor units with slightly decreased recruitment. No fibrillations or positive waves were seen. The vastus lateralis muscle reveals 2 to 4K motor units with full recruitment. No fibrillations or positive waves were seen. The iliopsoas muscle reveals 2 to 4K motor units with full recruitment. No fibrillations or positive waves were seen. The biceps femoris muscle (long head) reveals 2 to 4K motor units with full recruitment. No fibrillations or positive waves were  seen. The lumbosacral paraspinal muscles were tested at 3 levels, and revealed no abnormalities of insertional activity at all 3 levels tested. There was good relaxation.   IMPRESSION:  Nerve conduction studies done on all 4 extremities show evidence of a peripheral neuropathy of relatively significant severity. EMG evaluation of the right lower extremity showed minimal chronic stable distal signs of denervation, EMG suggests that the severity of the peripheral neuropathy is not as severe as the nerve conduction study suggests. No evidence of a lumbosacral radiculopathy was seen.  Jill Alexanders MD 07/03/2017 1:56 PM  [facility] [address] [place], [place] [address]  Phone [phone] Fax [phone]

## 2017-07-03 NOTE — Progress Notes (Signed)
Please refer to EMG and nerve conduction study procedure note. 

## 2017-07-04 ENCOUNTER — Telehealth: Payer: Self-pay | Admitting: Neurology

## 2017-07-04 NOTE — Progress Notes (Signed)
[facility]    Nerve / Sites Muscle Latency Ref. Amplitude Ref. Rel Amp Segments Distance Velocity Ref. Area    ms ms mV mV %  cm m/s m/s mVms  L Median - APB     Wrist APB 4.0 ?4.4 10.9 ?4.0 100 Wrist - APB 7   53.3     Upper arm APB 8.4  11.3  103 Upper arm - Wrist 25 56 ?49 55.6  R Median - APB     Wrist APB 3.9 ?4.4 9.3 ?4.0 100 Wrist - APB 7   44.2     Upper arm APB 8.5  8.9  95.7 Upper arm - Wrist 25 54 ?49 42.8  L Ulnar - ADM     Wrist ADM 2.6 ?3.3 9.2 ?6.0 100 Wrist - ADM 7   35.2     B.Elbow ADM 8.1  7.6  83.4 B.Elbow - Wrist 22 39 ?49 32.6     A.Elbow ADM 10.4  7.5  98.6 A.Elbow - B.Elbow 10 45 ?49 32.7         A.Elbow - Wrist      R Ulnar - ADM     Wrist ADM 3.2 ?3.3 7.1 ?6.0 100 Wrist - ADM 7   21.3     B.Elbow ADM 7.8  5.7  79.6 B.Elbow - Wrist 22 48 ?49 21.0     A.Elbow ADM 9.5  7.2  126 A.Elbow - B.Elbow 10 58 ?49 25.6         A.Elbow - Wrist      L Peroneal - EDB     Ankle EDB NR ?6.5 NR ?2.0 NR Ankle - EDB 9   NR     Fib head EDB NR  NR  NR Fib head - Ankle   ?44 NR     Pop fossa EDB NR  NR  NR Pop fossa - Fib head   ?44 NR         Pop fossa - Ankle      R Peroneal - EDB     Ankle EDB  ?6.5  ?2.0  Ankle - EDB 9        Fib head EDB      Fib head - Ankle   ?44      Pop fossa EDB NR  NR  NR Pop fossa - Fib head   ?44 NR         Pop fossa - Ankle      L Tibial - AH     Ankle AH NR ?5.8 NR ?4.0 NR Ankle - AH 9   NR     Pop fossa AH NR  NR  NR Pop fossa - Ankle   ?41 NR  R Tibial - AH     Ankle AH  ?5.8  ?4.0  Ankle - AH 9        Pop fossa AH NR  NR  NR Pop fossa - Ankle   ?[place]    Nerve / Sites Rec. Site Peak Lat Ref.  Amp Ref. Segments Distance    ms ms V V  cm  R Superficial peroneal - Ankle     Lat leg Ankle NR ?4.4 NR ?6 Lat leg - Ankle 14  L Superficial peroneal - Ankle     Lat leg Ankle NR ?4.4  NR ?6 Lat leg - Ankle 14         SNC    Nerve / Sites Rec. Site Peak Lat Amp Segments Distance    ms V  cm  L Median -  Orthodromic (Dig II, Mid palm)     Dig II Wrist 4.2 12 Dig II - Wrist 13  R Median - Orthodromic (Dig II, Mid palm)     Dig II Wrist 4.3 27 Dig II - Wrist 13  L Ulnar - Orthodromic, (Dig V, Mid palm)     Dig V Wrist 3.8 12 Dig V - Wrist 11  R Ulnar - Orthodromic, (Dig V, Mid palm)     Dig V Wrist 3.9 26 Dig V - Wrist 35             F  Wave    Nerve F Lat Ref.   ms ms  L Median - APB 31.4 ?31.0  L Ulnar - ADM 33.4 ?32.0  R Median - APB 31.4 ?31.0  R Ulnar - ADM 33.3 ?32.0             EMG full       EMG Summary Table    Spontaneous MUAP Recruitment  Muscle IA Fib PSW Fasc Other Amp Dur. Poly Pattern  R. Abductor digiti minimi (manus) Normal None None None _______ Normal Normal Normal Normal

## 2017-07-04 NOTE — Telephone Encounter (Signed)
Called to discuss with the patient. No answer at this time. LVM for patient to return call.

## 2017-07-04 NOTE — Telephone Encounter (Signed)
Pt returned call and I was able to go over his EMG results. Encouraged patient to treat this with a multivitamin. Pt verbalized understanding

## 2017-07-04 NOTE — Telephone Encounter (Signed)
-----   Message from Larey Seat, MD sent at 07/04/2017 10:33 AM EDT ----- Peripheral neuropathy was confirmed, but no major motor involvement as muscle works still normally. The origin of such peripheral neuropathy can be vitamin deficiency, ETOH abuse and/ or endocrine disorders. A multivitamin and multi-mineral is recommended.  Cc to Primary Care Physician

## 2017-10-25 ENCOUNTER — Other Ambulatory Visit: Payer: Self-pay | Admitting: Internal Medicine

## 2018-03-08 ENCOUNTER — Other Ambulatory Visit: Payer: Self-pay | Admitting: Internal Medicine

## 2018-04-03 ENCOUNTER — Encounter: Payer: Self-pay | Admitting: Internal Medicine

## 2018-04-03 ENCOUNTER — Ambulatory Visit (INDEPENDENT_AMBULATORY_CARE_PROVIDER_SITE_OTHER): Payer: Medicare HMO | Admitting: Internal Medicine

## 2018-04-03 ENCOUNTER — Other Ambulatory Visit: Payer: Self-pay | Admitting: Internal Medicine

## 2018-04-03 ENCOUNTER — Other Ambulatory Visit (INDEPENDENT_AMBULATORY_CARE_PROVIDER_SITE_OTHER): Payer: Medicare HMO

## 2018-04-03 VITALS — BP 122/90 | HR 102 | Temp 99.1°F | Ht 72.0 in | Wt 164.0 lb

## 2018-04-03 DIAGNOSIS — G621 Alcoholic polyneuropathy: Secondary | ICD-10-CM

## 2018-04-03 DIAGNOSIS — F10288 Alcohol dependence with other alcohol-induced disorder: Secondary | ICD-10-CM

## 2018-04-03 LAB — COMPREHENSIVE METABOLIC PANEL
ALT: 13 U/L (ref 0–53)
AST: 45 U/L — ABNORMAL HIGH (ref 0–37)
Albumin: 4.2 g/dL (ref 3.5–5.2)
Alkaline Phosphatase: 94 U/L (ref 39–117)
BUN: 5 mg/dL — ABNORMAL LOW (ref 6–23)
CHLORIDE: 102 meq/L (ref 96–112)
CO2: 27 mEq/L (ref 19–32)
Calcium: 9 mg/dL (ref 8.4–10.5)
Creatinine, Ser: 0.83 mg/dL (ref 0.40–1.50)
GFR: 121.58 mL/min (ref >60.00)
GLUCOSE: 96 mg/dL (ref 70–99)
POTASSIUM: 3.4 meq/L — AB (ref 3.5–5.1)
Sodium: 140 mEq/L (ref 135–145)
TOTAL PROTEIN: 8.1 g/dL (ref 6.0–8.3)
Total Bilirubin: 0.8 mg/dL (ref 0.2–1.2)

## 2018-04-03 LAB — CBC
HEMATOCRIT: 35.1 % — AB (ref 39.0–52.0)
HEMOGLOBIN: 12.4 g/dL — AB (ref 13.0–17.0)
MCHC: 35.3 g/dL (ref 30.0–36.0)
MCV: 106.2 fl — AB (ref 78.0–100.0)
PLATELETS: 230 10*3/uL (ref 150.0–400.0)
RBC: 3.3 Mil/uL — AB (ref 4.22–5.81)
RDW: 18.5 % — AB (ref 11.5–15.5)
WBC: 5.6 10*3/uL (ref 4.0–10.5)

## 2018-04-03 LAB — TSH: TSH: 2 u[IU]/mL (ref 0.35–4.50)

## 2018-04-03 LAB — LIPID PANEL
CHOLESTEROL: 137 mg/dL (ref 0–200)
HDL: 65.7 mg/dL (ref >39.00)
LDL CALC: 59 mg/dL (ref 0–99)
NonHDL: 71.2
Total CHOL/HDL Ratio: 2
Triglycerides: 61 mg/dL (ref 0.0–149.0)
VLDL: 12.2 mg/dL (ref 0.0–40.0)

## 2018-04-03 LAB — HEMOGLOBIN A1C: Hgb A1c MFr Bld: 4.6 % (ref 4.6–6.5)

## 2018-04-03 LAB — VITAMIN D 25 HYDROXY (VIT D DEFICIENCY, FRACTURES): VITD: 4.57 ng/mL — ABNORMAL LOW (ref 30.00–100.00)

## 2018-04-03 LAB — VITAMIN B12: Vitamin B-12: 174 pg/mL — ABNORMAL LOW (ref 211–911)

## 2018-04-03 MED ORDER — PREGABALIN 75 MG PO CAPS: 75.0000 mg | ORAL_CAPSULE | Freq: Two times a day (BID) | ORAL | 0 refills | Status: DC

## 2018-04-03 MED ORDER — VITAMIN D (ERGOCALCIFEROL) 1.25 MG (50000 UNIT) PO CAPS: 50000.0000 [IU] | ORAL_CAPSULE | ORAL | 0 refills | Status: DC

## 2018-04-03 MED ORDER — PROMETHAZINE HCL 25 MG PO TABS: ORAL_TABLET | ORAL | 0 refills | Status: DC

## 2018-04-03 NOTE — Progress Notes (Signed)
   Subjective:    Patient ID: Johnny Mcknight, male    DOB: 09-08-1956, 62 y.o.   MRN: [id number]  HPI The patient is a 62 YO man coming in for bilateral knee pain. Describes as a throbbing pain. Worse in the last several months. Denies injury or overuse. Was told by neurologist to take multivitamins. He does have history of alcoholic neuropathy and alcohol use disorder with continued use of about 1/2 pint vodka daily. He does not see this as a problem and has no thoughts of quitting. Does recall being told this is causing his numbness. He was tried on low dose gabapentin in the past and he does not recall that working at all. He is not taking it currently. Does not eat well and some days is not hungry and does not eat at all. Is not taking multivitamin. Uses cane but has fallen twice in the last month at home. Denies injuries and did not seek care for this.   Review of Systems  Constitutional: Positive for activity change and appetite change.  HENT: Negative.   Eyes: Negative.   Respiratory: Negative for cough, chest tightness and shortness of breath.   Cardiovascular: Negative for chest pain, palpitations and leg swelling.  Gastrointestinal: Negative for abdominal distention, abdominal pain, constipation, diarrhea, nausea and vomiting.  Musculoskeletal: Positive for gait problem and myalgias. Negative for arthralgias, back pain and joint swelling.  Skin: Negative.   Neurological: Positive for weakness and numbness.  Psychiatric/Behavioral: Negative.       Objective:   Physical Exam  Constitutional: He is oriented to person, place, and time. He appears well-developed.  HENT:  Head: Normocephalic and atraumatic.  Eyes: EOM are normal.  Neck: Normal range of motion.  Cardiovascular: Normal rate and regular rhythm.  Pulmonary/Chest: Effort normal and breath sounds normal. No respiratory distress. He has no wheezes. He has no rales.  Abdominal: Soft. Bowel sounds are normal. He exhibits no  distension. There is no tenderness. There is no rebound.  Musculoskeletal: He exhibits no edema.  No swelling or tenderness in the knees  Neurological: He is alert and oriented to person, place, and time. A cranial nerve deficit is present. Coordination normal.  Skin: Skin is warm and dry.   Vitals:   04/03/18 0947  BP: 122/90  Pulse: (!) 102  Temp: 99.1 F (37.3 C)  TempSrc: Oral  SpO2: 99%  Weight: 164 lb (74.4 kg)  Height: 6' (1.829 m)      Assessment & Plan:  Visit time 25 minutes: greater than 50% of that time was spent in face to face counseling and coordination of care with the patient: counseled about alcohol usage and potential negative consequences he is experiencing and will experience with continued alcohol usage as well as possible treatments for his numbness and pain.

## 2018-04-03 NOTE — Assessment & Plan Note (Signed)
Checkin CBC, CMP, B12, thyroid, Vitamin D today and adjust as needed. Encouraged to take multivitamin. Already complicated by peripheral neuropathy which is worsening with time. Rx for lyrica for this. He does not want to quit alcohol currently even after counseling about the harm it is doing to his body. Using cane for walking but may need walker in the future.

## 2018-04-03 NOTE — Patient Instructions (Signed)
We have sent in lyrica which might help with the numbness in the legs.   This numbness is likely coming from the alcohol use and stopping alcohol is going to help this from getting worse.   We are checking the labs today to check if you have a vitamin deficiency.   I would recommend to take a daily multivitamin since you are not eating much food.

## 2018-04-03 NOTE — Assessment & Plan Note (Signed)
It is likely that this is not reversible. He is unwilling to quit or cut back on alcohol. Using 1/2 pint daily vodka. Rx for lyrica to see if this helps with pain but needs to follow up with pcp within 1 month.

## 2018-05-14 ENCOUNTER — Ambulatory Visit (INDEPENDENT_AMBULATORY_CARE_PROVIDER_SITE_OTHER): Payer: Medicare HMO | Admitting: Internal Medicine

## 2018-05-14 ENCOUNTER — Encounter: Payer: Self-pay | Admitting: Internal Medicine

## 2018-05-14 VITALS — BP 128/82 | HR 108 | Temp 99.0°F | Ht 72.0 in | Wt 164.0 lb

## 2018-05-14 DIAGNOSIS — G629 Polyneuropathy, unspecified: Secondary | ICD-10-CM

## 2018-05-14 DIAGNOSIS — N320 Bladder-neck obstruction: Secondary | ICD-10-CM

## 2018-05-14 DIAGNOSIS — G621 Alcoholic polyneuropathy: Secondary | ICD-10-CM

## 2018-05-14 DIAGNOSIS — F10288 Alcohol dependence with other alcohol-induced disorder: Secondary | ICD-10-CM

## 2018-05-14 DIAGNOSIS — E538 Deficiency of other specified B group vitamins: Secondary | ICD-10-CM

## 2018-05-14 DIAGNOSIS — K219 Gastro-esophageal reflux disease without esophagitis: Secondary | ICD-10-CM

## 2018-05-14 DIAGNOSIS — E559 Vitamin D deficiency, unspecified: Secondary | ICD-10-CM

## 2018-05-14 HISTORY — DX: Vitamin D deficiency, unspecified: E55.9

## 2018-05-14 MED ORDER — CYANOCOBALAMIN 1000 MCG/ML IJ SOLN
1000.0000 ug | Freq: Once | INTRAMUSCULAR | Status: AC
  Administered 2018-05-14: 1000 ug via INTRAMUSCULAR

## 2018-05-14 MED ORDER — VITAMIN D3 50 MCG (2000 UT) PO CAPS: 2000.0000 [IU] | ORAL_CAPSULE | Freq: Every day | ORAL | 3 refills | Status: DC

## 2018-05-14 MED ORDER — B COMPLEX PO TABS: 1.0000 | ORAL_TABLET | Freq: Every day | ORAL | 3 refills | Status: DC

## 2018-05-14 MED ORDER — RANITIDINE HCL 300 MG PO TABS: 300.0000 mg | ORAL_TABLET | Freq: Every day | ORAL | 11 refills | Status: DC

## 2018-05-14 MED ORDER — PROMETHAZINE HCL 25 MG PO TABS: ORAL_TABLET | ORAL | 1 refills | Status: DC

## 2018-05-14 NOTE — Addendum Note (Signed)
Addended by: Karren Cobble on: 05/14/2018 11:22 AM   Modules accepted: Orders

## 2018-05-14 NOTE — Progress Notes (Signed)
Subjective:  Patient ID: Johnny Mcknight, male    DOB: [birth year]  Age: 62 y.o. MRN: [id number]  CC: No chief complaint on file.   HPI Johnny Mcknight presents for ETOH abuse, neuropathy, gait disorder  f/u. C/o dry skin all over Drinking less - 1/2 pint of Vodka a day  Outpatient Medications Prior to Visit  Medication Sig Dispense Refill  . pregabalin (LYRICA) 75 MG capsule Take 1 capsule (75 mg total) by mouth 2 (two) times daily. 60 capsule 0  . promethazine (PHENERGAN) 25 MG tablet TAKE 1 TABLET BY MOUTH EVERY 8 HOURS AS NEEDED FOR NAUSEA OR VOMITING USE  FOR  HICCUPS  AS  NEEDED 10 tablet 0  . ranitidine (ZANTAC) 300 MG tablet Take 1 tablet (300 mg total) by mouth at bedtime. 30 tablet 5  . Vitamin D, Ergocalciferol, (DRISDOL) 50000 units CAPS capsule Take 1 capsule (50,000 Units total) by mouth every 7 (seven) days. 12 capsule 0   No facility-administered medications prior to visit.     ROS: Review of Systems  Constitutional: Positive for fatigue. Negative for appetite change and unexpected weight change.  HENT: Negative for congestion, nosebleeds, sneezing, sore throat and trouble swallowing.   Eyes: Negative for itching and visual disturbance.  Respiratory: Negative for cough.   Cardiovascular: Negative for chest pain, palpitations and leg swelling.  Gastrointestinal: Negative for abdominal distention, blood in stool, diarrhea and nausea.  Genitourinary: Negative for frequency and hematuria.  Musculoskeletal: Positive for gait problem. Negative for back pain, joint swelling and neck pain.  Skin: Negative for rash.  Neurological: Positive for dizziness, tremors, weakness and numbness. Negative for speech difficulty.  Psychiatric/Behavioral: Negative for agitation, dysphoric mood and sleep disturbance. The patient is not nervous/anxious.   hiccups  Objective:  BP 128/82 (BP Location: Left Arm, Patient Position: Sitting, Cuff Size: Normal)   Pulse (!) 108   Temp 99 F (37.2 C)  (Oral)   Ht 6' (1.829 m)   Wt 164 lb (74.4 kg)   SpO2 99%   BMI 22.24 kg/m   BP Readings from Last 3 Encounters:  05/14/18 128/82  04/03/18 122/90  05/16/17 132/89    Wt Readings from Last 3 Encounters:  05/14/18 164 lb (74.4 kg)  04/03/18 164 lb (74.4 kg)  05/16/17 151 lb (68.5 kg)    Physical Exam  Constitutional: He is oriented to person, place, and time. He appears well-developed. No distress.  NAD  HENT:  Mouth/Throat: Oropharynx is clear and moist.  Eyes: Pupils are equal, round, and reactive to light. Conjunctivae are normal.  Neck: Normal range of motion. No JVD present. No thyromegaly present.  Cardiovascular: Normal rate, regular rhythm, normal heart sounds and intact distal pulses. Exam reveals no gallop and no friction rub.  No murmur heard. Pulmonary/Chest: Effort normal and breath sounds normal. No respiratory distress. He has no wheezes. He has no rales. He exhibits no tenderness.  Abdominal: Soft. Bowel sounds are normal. He exhibits no distension and no mass. There is no tenderness. There is no rebound and no guarding.  Musculoskeletal: Normal range of motion. He exhibits no edema or tenderness.  Lymphadenopathy:    He has no cervical adenopathy.  Neurological: He is alert and oriented to person, place, and time. He has normal reflexes. No cranial nerve deficit. He exhibits normal muscle tone. He displays a negative Romberg sign. Coordination and gait normal.  Skin: Skin is warm and dry. No rash noted.  Psychiatric: He has a normal mood and affect.  His behavior is normal. Judgment and thought content normal.  ataxic cane Mild tremor  Lab Results  Component Value Date   WBC 5.6 04/03/2018   HGB 12.4 (L) 04/03/2018   HCT 35.1 (L) 04/03/2018   PLT 230.0 04/03/2018   GLUCOSE 96 04/03/2018   CHOL 137 04/03/2018   TRIG 61.0 04/03/2018   HDL 65.70 04/03/2018   LDLCALC 59 04/03/2018   ALT 13 04/03/2018   AST 45 (H) 04/03/2018   NA 140 04/03/2018   K 3.4  (L) 04/03/2018   CL 102 04/03/2018   CREATININE 0.83 04/03/2018   BUN 5 (L) 04/03/2018   CO2 27 04/03/2018   TSH 2.00 04/03/2018   PSA 2.14 10/15/2007   INR 0.91 09/06/2009   HGBA1C 4.6 04/03/2018    No results found.  Assessment & Plan:   There are no diagnoses linked to this encounter.   No orders of the defined types were placed in this encounter.    Follow-up: No follow-ups on file.  Walker Kehr, MD

## 2018-05-14 NOTE — Assessment & Plan Note (Addendum)
Zantac bid Drink less or stop Labs

## 2018-05-14 NOTE — Assessment & Plan Note (Signed)
Drinking less - 1/2 pint of Vodka a day

## 2018-05-14 NOTE — Assessment & Plan Note (Signed)
Vit d Labs 

## 2018-05-14 NOTE — Assessment & Plan Note (Signed)
Drinking less - 1/2 pint of Vodka a day Vit B complex - re-start

## 2018-05-14 NOTE — Assessment & Plan Note (Signed)
B complex Labs

## 2018-05-14 NOTE — Assessment & Plan Note (Signed)
Drinking less - 1/2 pint of Vodka a day Cane

## 2018-08-20 ENCOUNTER — Ambulatory Visit (INDEPENDENT_AMBULATORY_CARE_PROVIDER_SITE_OTHER): Payer: Medicare HMO | Admitting: Internal Medicine

## 2018-08-20 ENCOUNTER — Other Ambulatory Visit (INDEPENDENT_AMBULATORY_CARE_PROVIDER_SITE_OTHER): Payer: Medicare HMO

## 2018-08-20 ENCOUNTER — Encounter: Payer: Self-pay | Admitting: Internal Medicine

## 2018-08-20 DIAGNOSIS — D638 Anemia in other chronic diseases classified elsewhere: Secondary | ICD-10-CM

## 2018-08-20 DIAGNOSIS — G629 Polyneuropathy, unspecified: Secondary | ICD-10-CM | POA: Diagnosis not present

## 2018-08-20 DIAGNOSIS — N320 Bladder-neck obstruction: Secondary | ICD-10-CM

## 2018-08-20 DIAGNOSIS — G621 Alcoholic polyneuropathy: Secondary | ICD-10-CM

## 2018-08-20 DIAGNOSIS — E559 Vitamin D deficiency, unspecified: Secondary | ICD-10-CM | POA: Diagnosis not present

## 2018-08-20 DIAGNOSIS — I10 Essential (primary) hypertension: Secondary | ICD-10-CM

## 2018-08-20 DIAGNOSIS — E538 Deficiency of other specified B group vitamins: Secondary | ICD-10-CM | POA: Diagnosis not present

## 2018-08-20 DIAGNOSIS — L853 Xerosis cutis: Secondary | ICD-10-CM

## 2018-08-20 LAB — HEPATIC FUNCTION PANEL
ALBUMIN: 4.4 g/dL (ref 3.5–5.2)
ALT: 31 U/L (ref 0–53)
AST: 87 U/L — ABNORMAL HIGH (ref 0–37)
Alkaline Phosphatase: 107 U/L (ref 39–117)
Bilirubin, Direct: 0.5 mg/dL — ABNORMAL HIGH (ref 0.0–0.3)
TOTAL PROTEIN: 8 g/dL (ref 6.0–8.3)
Total Bilirubin: 1.4 mg/dL — ABNORMAL HIGH (ref 0.2–1.2)

## 2018-08-20 LAB — CBC WITH DIFFERENTIAL/PLATELET
BASOS ABS: 0 10*3/uL (ref 0.0–0.1)
Basophils Relative: 0.3 % (ref 0.0–3.0)
Eosinophils Absolute: 0.1 10*3/uL (ref 0.0–0.7)
Eosinophils Relative: 1.2 % (ref 0.0–5.0)
HCT: 36.1 % — ABNORMAL LOW (ref 39.0–52.0)
Hemoglobin: 12.5 g/dL — ABNORMAL LOW (ref 13.0–17.0)
Lymphocytes Relative: 19.6 % (ref 12.0–46.0)
Lymphs Abs: 1.3 10*3/uL (ref 0.7–4.0)
MCHC: 34.7 g/dL (ref 30.0–36.0)
MCV: 102.7 fl — ABNORMAL HIGH (ref 78.0–100.0)
MONO ABS: 0.6 10*3/uL (ref 0.1–1.0)
Monocytes Relative: 9.1 % (ref 3.0–12.0)
Neutro Abs: 4.6 10*3/uL (ref 1.4–7.7)
Neutrophils Relative %: 69.8 % (ref 43.0–77.0)
Platelets: 167 10*3/uL (ref 150.0–400.0)
RBC: 3.51 Mil/uL — ABNORMAL LOW (ref 4.22–5.81)
RDW: 15.1 % (ref 11.5–15.5)
WBC: 6.5 10*3/uL (ref 4.0–10.5)

## 2018-08-20 LAB — TSH: TSH: 2.4 u[IU]/mL (ref 0.35–4.50)

## 2018-08-20 LAB — URINALYSIS, ROUTINE W REFLEX MICROSCOPIC
Ketones, ur: 15 — AB
Leukocytes, UA: NEGATIVE
PH: 5.5 (ref 5.0–8.0)
RBC / HPF: NONE SEEN (ref 0–?)
Specific Gravity, Urine: 1.025 (ref 1.000–1.030)
Total Protein, Urine: 30 — AB
Urine Glucose: NEGATIVE
Urobilinogen, UA: 2 — AB (ref 0.0–1.0)

## 2018-08-20 LAB — LIPID PANEL
Cholesterol: 157 mg/dL (ref 0–200)
HDL: 84.2 mg/dL (ref >39.00)
LDL CALC: 57 mg/dL (ref 0–99)
NonHDL: 73.1
Total CHOL/HDL Ratio: 2
Triglycerides: 79 mg/dL (ref 0.0–149.0)
VLDL: 15.8 mg/dL (ref 0.0–40.0)

## 2018-08-20 LAB — BASIC METABOLIC PANEL
BUN: 7 mg/dL (ref 6–23)
CHLORIDE: 102 meq/L (ref 96–112)
CO2: 24 mEq/L (ref 19–32)
Calcium: 9.5 mg/dL (ref 8.4–10.5)
Creatinine, Ser: 1 mg/dL (ref 0.40–1.50)
GFR: 97.93 mL/min (ref >60.00)
Glucose, Bld: 93 mg/dL (ref 70–99)
POTASSIUM: 4 meq/L (ref 3.5–5.1)
SODIUM: 141 meq/L (ref 135–145)

## 2018-08-20 LAB — VITAMIN B12: Vitamin B-12: 305 pg/mL (ref 211–911)

## 2018-08-20 LAB — PSA: PSA: 1.76 ng/mL (ref 0.10–4.00)

## 2018-08-20 LAB — VITAMIN D 25 HYDROXY (VIT D DEFICIENCY, FRACTURES): VITD: 65.9 ng/mL (ref 30.00–100.00)

## 2018-08-20 MED ORDER — TRIAMCINOLONE 0.1 % CREAM:EUCERIN CREAM 1:1: 1.0000 "application " | TOPICAL_CREAM | Freq: Two times a day (BID) | CUTANEOUS | 3 refills | Status: DC | PRN

## 2018-08-20 MED ORDER — PROMETHAZINE HCL 25 MG PO TABS: ORAL_TABLET | ORAL | 1 refills | Status: DC

## 2018-08-20 NOTE — Assessment & Plan Note (Signed)
Gabapentin prn B complex po

## 2018-08-20 NOTE — Assessment & Plan Note (Signed)
Short shower [employer] in [employer]

## 2018-08-20 NOTE — Progress Notes (Signed)
Subjective:  Patient ID: Johnny Mcknight, male    DOB: 08/02/1956  Age: 62 y.o. MRN: [id number]  CC: No chief complaint on file.   HPI [name] presents for ETOH abuse, neuropathy, gait disorder  f/u. C/o dry skin all over - not better  Outpatient Medications Prior to Visit  Medication Sig Dispense Refill  . b complex vitamins tablet Take 1 tablet by mouth daily. 100 tablet 3  . Cholecalciferol (VITAMIN D3) 2000 units capsule Take 1 capsule (2,000 Units total) by mouth daily. 100 capsule 3  . pregabalin (LYRICA) 75 MG capsule Take 1 capsule (75 mg total) by mouth 2 (two) times daily. 60 capsule 0  . promethazine (PHENERGAN) 25 MG tablet TAKE 1 TABLET BY MOUTH EVERY 8 HOURS AS NEEDED FOR NAUSEA OR VOMITING USE  FOR  HICCUPS  AS  NEEDED 60 tablet 1  . ranitidine (ZANTAC) 300 MG tablet Take 1 tablet (300 mg total) by mouth at bedtime. 30 tablet 11   No facility-administered medications prior to visit.     ROS: Review of Systems  Constitutional: Negative for appetite change, fatigue and unexpected weight change.  HENT: Negative for congestion, nosebleeds, sneezing, sore throat and trouble swallowing.   Eyes: Negative for itching and visual disturbance.  Respiratory: Negative for cough.   Cardiovascular: Negative for chest pain, palpitations and leg swelling.  Gastrointestinal: Negative for abdominal distention, blood in stool, diarrhea and nausea.  Genitourinary: Negative for frequency and hematuria.  Musculoskeletal: Negative for back pain, gait problem, joint swelling and neck pain.  Skin: Negative for rash.  Neurological: Negative for dizziness, tremors, speech difficulty and weakness.  Psychiatric/Behavioral: Negative for agitation, dysphoric mood, sleep disturbance and suicidal ideas. The patient is nervous/anxious.     Objective:  BP (!) 142/80 (BP Location: Left Arm, Patient Position: Sitting, Cuff Size: Normal)   Pulse (!) 110   Temp 98.9 F (37.2 C) (Oral)   Ht 6' (1.829  m)   Wt 171 lb (77.6 kg)   SpO2 98%   BMI 23.19 kg/m   BP Readings from Last 3 Encounters:  08/20/18 (!) 142/80  05/14/18 128/82  04/03/18 122/90    Wt Readings from Last 3 Encounters:  08/20/18 171 lb (77.6 kg)  05/14/18 164 lb (74.4 kg)  04/03/18 164 lb (74.4 kg)    Physical Exam  Constitutional: He is oriented to person, place, and time. He appears well-developed. No distress.  NAD  HENT:  Mouth/Throat: Oropharynx is clear and moist.  Eyes: Pupils are equal, round, and reactive to light. Conjunctivae are normal.  Neck: Normal range of motion. No JVD present. No thyromegaly present.  Cardiovascular: Normal rate, regular rhythm, normal heart sounds and intact distal pulses. Exam reveals no gallop and no friction rub.  No murmur heard. Pulmonary/Chest: Effort normal and breath sounds normal. No respiratory distress. He has no wheezes. He has no rales. He exhibits no tenderness.  Abdominal: Soft. Bowel sounds are normal. He exhibits no distension and no mass. There is no tenderness. There is no rebound and no guarding.  Musculoskeletal: Normal range of motion. He exhibits no edema or tenderness.  Lymphadenopathy:    He has no cervical adenopathy.  Neurological: He is alert and oriented to person, place, and time. He displays normal reflexes. No cranial nerve deficit. He exhibits normal muscle tone. He displays a negative Romberg sign. Coordination abnormal. Gait normal.  Skin: Skin is warm and dry. No rash noted.  Psychiatric: He has a normal mood and affect.  His behavior is normal. Judgment and thought content normal.  dry skin Cane No tremor  Lab Results  Component Value Date   WBC 5.6 04/03/2018   HGB 12.4 (L) 04/03/2018   HCT 35.1 (L) 04/03/2018   PLT 230.0 04/03/2018   GLUCOSE 96 04/03/2018   CHOL 137 04/03/2018   TRIG 61.0 04/03/2018   HDL 65.70 04/03/2018   LDLCALC 59 04/03/2018   ALT 13 04/03/2018   AST 45 (H) 04/03/2018   NA 140 04/03/2018   K 3.4 (L)  04/03/2018   CL 102 04/03/2018   CREATININE 0.83 04/03/2018   BUN 5 (L) 04/03/2018   CO2 27 04/03/2018   TSH 2.00 04/03/2018   PSA 2.14 10/15/2007   INR 0.91 09/06/2009   HGBA1C 4.6 04/03/2018    No results found.  Assessment & Plan:   There are no diagnoses linked to this encounter.   No orders of the defined types were placed in this encounter.    Follow-up: No follow-ups on file.  Walker Kehr, MD

## 2018-08-20 NOTE — Assessment & Plan Note (Signed)
CT ca scoring offered NAS diet 

## 2018-08-20 NOTE — Patient Instructions (Signed)

## 2018-08-20 NOTE — Assessment & Plan Note (Signed)
Vit D 

## 2018-08-20 NOTE — Assessment & Plan Note (Signed)
CBC

## 2018-09-06 ENCOUNTER — Ambulatory Visit: Payer: Self-pay | Admitting: *Deleted

## 2018-09-06 NOTE — Telephone Encounter (Signed)
  Reason for Disposition . Pharmacy calling with prescription questions and triager unable to answer question  Protocols used: MEDICATION QUESTION CALL-A-AH

## 2018-09-06 NOTE — Telephone Encounter (Addendum)
Will route to office for final disposition; spoke with Sam at [facility]; she request that request be sent to office for provider review; prescription written 08/20/18 by Dr Alain Marion.

## 2018-09-07 NOTE — Telephone Encounter (Signed)
Pharmacy could not tell me anything about this and stated they had no prescriptions for patient

## 2018-09-10 ENCOUNTER — Telehealth: Payer: Self-pay | Admitting: Internal Medicine

## 2018-09-10 NOTE — Telephone Encounter (Signed)
Copied from [place] [phone]. Topic: Quick Communication - Rx Refill/Question >> Sep 10, 2018  9:09 AM Virl Axe D wrote: Medication: Triamcinolone Acetonide (TRIAMCINOLONE 0.1 % CREAM : EUCERIN) CREA / Pharmacy stated they have reached out once before. They need to know the ratio of TRIAMCINOLONE to Eucerin and the final quantity in ounces. Please advise.  Has the patient contacted their pharmacy? Yes.   (Agent: If no, request that the patient contact the pharmacy for the refill.) (Agent: If yes, when and what did the pharmacy advise?)  Preferred Pharmacy (with phone number or street name): [facility] ([address]), [place] [phone] (Phone) [phone] (Fax)    Agent: Please be advised that RX refills may take up to 3 business days. We ask that you follow-up with your pharmacy.

## 2018-09-10 NOTE — Telephone Encounter (Signed)
Routing to dr [employer], pharmacy needs ratio for this medicine, please advise, I will call them back, thanks

## 2018-09-11 MED ORDER — TRIAMCINOLONE 0.1 % CREAM:EUCERIN CREAM 1:1: 1.0000 "application " | TOPICAL_CREAM | Freq: Two times a day (BID) | CUTANEOUS | 3 refills | Status: DC | PRN

## 2018-09-11 NOTE — Addendum Note (Signed)
Addended by: Cassandria Anger on: 09/11/2018 07:37 AM   Modules accepted: Orders

## 2018-09-11 NOTE — Telephone Encounter (Addendum)
Ok  It said 1:1 ratio on the Rx 500 g was in the pharmacy instructions. Thx

## 2018-09-11 NOTE — Telephone Encounter (Signed)
Called pharmacy spoke w/Katrina gave MD response.Marland KitchenJohny Chess

## 2018-12-25 ENCOUNTER — Ambulatory Visit: Payer: Medicare HMO | Admitting: Internal Medicine

## 2018-12-26 ENCOUNTER — Other Ambulatory Visit: Payer: Self-pay | Admitting: Internal Medicine

## 2019-02-20 ENCOUNTER — Other Ambulatory Visit (INDEPENDENT_AMBULATORY_CARE_PROVIDER_SITE_OTHER): Payer: Medicare HMO

## 2019-02-20 ENCOUNTER — Other Ambulatory Visit: Payer: Self-pay

## 2019-02-20 ENCOUNTER — Ambulatory Visit (INDEPENDENT_AMBULATORY_CARE_PROVIDER_SITE_OTHER): Payer: Medicare HMO | Admitting: Internal Medicine

## 2019-02-20 ENCOUNTER — Encounter: Payer: Self-pay | Admitting: Internal Medicine

## 2019-02-20 DIAGNOSIS — I10 Essential (primary) hypertension: Secondary | ICD-10-CM | POA: Diagnosis not present

## 2019-02-20 DIAGNOSIS — E876 Hypokalemia: Secondary | ICD-10-CM

## 2019-02-20 DIAGNOSIS — F10288 Alcohol dependence with other alcohol-induced disorder: Secondary | ICD-10-CM

## 2019-02-20 DIAGNOSIS — G629 Polyneuropathy, unspecified: Secondary | ICD-10-CM

## 2019-02-20 DIAGNOSIS — K219 Gastro-esophageal reflux disease without esophagitis: Secondary | ICD-10-CM

## 2019-02-20 DIAGNOSIS — G621 Alcoholic polyneuropathy: Secondary | ICD-10-CM | POA: Diagnosis not present

## 2019-02-20 DIAGNOSIS — E538 Deficiency of other specified B group vitamins: Secondary | ICD-10-CM

## 2019-02-20 DIAGNOSIS — E559 Vitamin D deficiency, unspecified: Secondary | ICD-10-CM

## 2019-02-20 LAB — BASIC METABOLIC PANEL
BUN: 5 mg/dL — ABNORMAL LOW (ref 6–23)
CO2: 24 mEq/L (ref 19–32)
Calcium: 9.1 mg/dL (ref 8.4–10.5)
Chloride: 102 mEq/L (ref 96–112)
Creatinine, Ser: 0.9 mg/dL (ref 0.40–1.50)
GFR: 103.87 mL/min (ref >60.00)
Glucose, Bld: 92 mg/dL (ref 70–99)
Potassium: 3.5 mEq/L (ref 3.5–5.1)
Sodium: 139 mEq/L (ref 135–145)

## 2019-02-20 LAB — VITAMIN D 25 HYDROXY (VIT D DEFICIENCY, FRACTURES): VITD: 56.01 ng/mL (ref 30.00–100.00)

## 2019-02-20 MED ORDER — PROMETHAZINE HCL 25 MG PO TABS: ORAL_TABLET | ORAL | 1 refills | Status: DC

## 2019-02-20 MED ORDER — FAMOTIDINE 40 MG PO TABS: 40.0000 mg | ORAL_TABLET | Freq: Every day | ORAL | 3 refills | Status: DC

## 2019-02-20 MED ORDER — VITAMIN D3 50 MCG (2000 UT) PO CAPS: 2000.0000 [IU] | ORAL_CAPSULE | Freq: Every day | ORAL | 3 refills | Status: DC

## 2019-02-20 MED ORDER — B COMPLEX PO TABS: 1.0000 | ORAL_TABLET | Freq: Every day | ORAL | 3 refills | Status: DC

## 2019-02-20 NOTE — Assessment & Plan Note (Signed)
On B12 

## 2019-02-20 NOTE — Assessment & Plan Note (Signed)
Discussed.

## 2019-02-20 NOTE — Assessment & Plan Note (Signed)
Vit D 

## 2019-02-20 NOTE — Assessment & Plan Note (Signed)
CT ca scoring offered NAS diet

## 2019-02-20 NOTE — Assessment & Plan Note (Signed)
Labs

## 2019-02-20 NOTE — Assessment & Plan Note (Signed)
Zantac bid - d/c Pepcid

## 2019-02-20 NOTE — Progress Notes (Signed)
Subjective:  Patient ID: Johnny Mcknight, male    DOB: August 06, 1956  Age: 63 y.o. MRN: [id number]  CC: No chief complaint on file.   HPI Lyden Redner presents for knee pain, BPH, neuropathy f/u C/o nausea - chronic F/u vit D def  Outpatient Medications Prior to Visit  Medication Sig Dispense Refill  . b complex vitamins tablet Take 1 tablet by mouth daily. 100 tablet 3  . Cholecalciferol (VITAMIN D3) 2000 units capsule Take 1 capsule (2,000 Units total) by mouth daily. 100 capsule 3  . promethazine (PHENERGAN) 25 MG tablet TAKE 1 TABLET BY MOUTH EVERY 8 HOURS AS NEEDED FOR NAUSEA OR VOMITING OR USE FOR HICCUPS AS NEEDED 60 tablet 0  . ranitidine (ZANTAC) 300 MG tablet Take 1 tablet (300 mg total) by mouth at bedtime. 30 tablet 11  . Triamcinolone Acetonide (TRIAMCINOLONE 0.1 % CREAM : EUCERIN) CREA Apply 1 application topically 2 (two) times daily as needed for rash, itching or irritation. 1 each 3   No facility-administered medications prior to visit.     ROS: Review of Systems  Constitutional: Negative for appetite change, fatigue, fever and unexpected weight change.  HENT: Negative for congestion, nosebleeds, sneezing, sore throat and trouble swallowing.   Eyes: Negative for itching and visual disturbance.  Respiratory: Negative for cough.   Cardiovascular: Negative for chest pain, palpitations and leg swelling.  Gastrointestinal: Negative for abdominal distention, blood in stool, diarrhea and nausea.  Genitourinary: Negative for frequency and hematuria.  Musculoskeletal: Positive for arthralgias and gait problem. Negative for back pain, joint swelling and neck pain.  Skin: Negative for rash.  Neurological: Positive for dizziness. Negative for tremors, speech difficulty and weakness.  Psychiatric/Behavioral: Positive for dysphoric mood. Negative for agitation, sleep disturbance and suicidal ideas. The patient is not nervous/anxious.     Objective:  BP 138/84 (BP Location: Left Arm,  Patient Position: Sitting, Cuff Size: Normal)   Pulse 95   Temp 98.9 F (37.2 C) (Oral)   Ht 6' (1.829 m)   Wt 172 lb (78 kg)   SpO2 98%   BMI 23.33 kg/m   BP Readings from Last 3 Encounters:  02/20/19 138/84  08/20/18 (!) 142/80  05/14/18 128/82    Wt Readings from Last 3 Encounters:  02/20/19 172 lb (78 kg)  08/20/18 171 lb (77.6 kg)  05/14/18 164 lb (74.4 kg)    Physical Exam Constitutional:      General: He is not in acute distress.    Appearance: He is well-developed.     Comments: NAD  Eyes:     Conjunctiva/sclera: Conjunctivae normal.     Pupils: Pupils are equal, round, and reactive to light.  Neck:     Musculoskeletal: Normal range of motion.     Thyroid: No thyromegaly.     Vascular: No JVD.  Cardiovascular:     Rate and Rhythm: Normal rate and regular rhythm.     Heart sounds: Normal heart sounds. No murmur. No friction rub. No gallop.   Pulmonary:     Effort: Pulmonary effort is normal. No respiratory distress.     Breath sounds: Normal breath sounds. No wheezing or rales.  Chest:     Chest wall: No tenderness.  Abdominal:     General: Bowel sounds are normal. There is no distension.     Palpations: Abdomen is soft. There is no mass.     Tenderness: There is no abdominal tenderness. There is no guarding or rebound.  Musculoskeletal: Normal range of  motion.        General: No tenderness.  Lymphadenopathy:     Cervical: No cervical adenopathy.  Skin:    General: Skin is warm and dry.     Findings: No rash.  Neurological:     Mental Status: He is alert and oriented to person, place, and time.     Cranial Nerves: No cranial nerve deficit.     Motor: No abnormal muscle tone.     Coordination: Coordination abnormal.     Gait: Gait abnormal.     Deep Tendon Reflexes: Reflexes are normal and symmetric.  Psychiatric:        Behavior: Behavior normal.        Thought Content: Thought content normal.        Judgment: Judgment normal.   cane Limp,  ataxic Knees - stiff dandruff  Lab Results  Component Value Date   WBC 6.5 08/20/2018   HGB 12.5 (L) 08/20/2018   HCT 36.1 (L) 08/20/2018   PLT 167.0 08/20/2018   GLUCOSE 93 08/20/2018   CHOL 157 08/20/2018   TRIG 79.0 08/20/2018   HDL 84.20 08/20/2018   LDLCALC 57 08/20/2018   ALT 31 08/20/2018   AST 87 (H) 08/20/2018   NA 141 08/20/2018   K 4.0 08/20/2018   CL 102 08/20/2018   CREATININE 1.00 08/20/2018   BUN 7 08/20/2018   CO2 24 08/20/2018   TSH 2.40 08/20/2018   PSA 1.76 08/20/2018   INR 0.91 09/06/2009   HGBA1C 4.6 04/03/2018    No results found.  Assessment & Plan:   There are no diagnoses linked to this encounter.   No orders of the defined types were placed in this encounter.    Follow-up: No follow-ups on file.  Walker Kehr, MD

## 2019-02-20 NOTE — Assessment & Plan Note (Signed)
B complex po Johnny Mcknight

## 2019-06-24 ENCOUNTER — Other Ambulatory Visit: Payer: Self-pay

## 2019-06-24 ENCOUNTER — Encounter: Payer: Self-pay | Admitting: Internal Medicine

## 2019-06-24 ENCOUNTER — Ambulatory Visit (INDEPENDENT_AMBULATORY_CARE_PROVIDER_SITE_OTHER): Payer: Medicare HMO | Admitting: Internal Medicine

## 2019-06-24 DIAGNOSIS — E538 Deficiency of other specified B group vitamins: Secondary | ICD-10-CM

## 2019-06-24 DIAGNOSIS — I10 Essential (primary) hypertension: Secondary | ICD-10-CM | POA: Diagnosis not present

## 2019-06-24 DIAGNOSIS — F10288 Alcohol dependence with other alcohol-induced disorder: Secondary | ICD-10-CM

## 2019-06-24 DIAGNOSIS — E559 Vitamin D deficiency, unspecified: Secondary | ICD-10-CM

## 2019-06-24 MED ORDER — PROMETHAZINE HCL 25 MG PO TABS: ORAL_TABLET | ORAL | 1 refills | Status: DC

## 2019-06-24 NOTE — Assessment & Plan Note (Signed)
On B12 

## 2019-06-24 NOTE — Assessment & Plan Note (Signed)
NAS diet 

## 2019-06-24 NOTE — Assessment & Plan Note (Signed)
Vit D Risks associated with treatment noncompliance were discussed. Compliance was encouraged.  

## 2019-06-24 NOTE — Patient Instructions (Signed)
If you have medicare related insurance (such as traditional Medicare, Blue Cross Medicare, United Health[employer] Medicare, or similar), Please make an appointment at the scheduling desk with Jill, the Wellness Health Coach, for your Wellness visit in this office, which is a benefit with your insurance.  

## 2019-06-24 NOTE — Progress Notes (Addendum)
Subjective:  Patient ID: Johnny Mcknight, male    DOB: 03/17/1956  Age: 63 y.o. MRN: [id number]  CC: No chief complaint on file.   HPI Johnny Mcknight presents for chronic nausea, GERD, neuropathy  Outpatient Medications Prior to Visit  Medication Sig Dispense Refill  . b complex vitamins tablet Take 1 tablet by mouth daily. 100 tablet 3  . Cholecalciferol (VITAMIN D3) 50 MCG (2000 UT) capsule Take 1 capsule (2,000 Units total) by mouth daily. 100 capsule 3  . famotidine (PEPCID) 40 MG tablet Take 1 tablet (40 mg total) by mouth daily. 90 tablet 3  . promethazine (PHENERGAN) 25 MG tablet TAKE 1 TABLET BY MOUTH EVERY 8 HOURS AS NEEDED FOR NAUSEA OR VOMITING OR USE FOR HICCUPS AS NEEDED 60 tablet 1  . Triamcinolone Acetonide (TRIAMCINOLONE 0.1 % CREAM : EUCERIN) CREA Apply 1 application topically 2 (two) times daily as needed for rash, itching or irritation. 1 each 3   No facility-administered medications prior to visit.     ROS: Review of Systems  Constitutional: Negative for appetite change, fatigue and unexpected weight change.  HENT: Negative for congestion, nosebleeds, sneezing, sore throat and trouble swallowing.   Eyes: Negative for itching and visual disturbance.  Respiratory: Negative for cough.   Cardiovascular: Negative for chest pain, palpitations and leg swelling.  Gastrointestinal: Positive for nausea. Negative for abdominal distention, blood in stool and diarrhea.  Genitourinary: Negative for frequency and hematuria.  Musculoskeletal: Positive for arthralgias and gait problem. Negative for back pain, joint swelling and neck pain.  Skin: Negative for rash.  Neurological: Positive for weakness. Negative for dizziness, tremors and speech difficulty.  Psychiatric/Behavioral: Negative for agitation, dysphoric mood, sleep disturbance and suicidal ideas. The patient is not nervous/anxious.     Objective:  BP 136/88 (BP Location: Left Arm, Patient Position: Sitting, Cuff Size:  Normal)   Pulse (!) 104   Temp 99.2 F (37.3 C) (Oral)   Ht 6' (1.829 m)   Wt 171 lb (77.6 kg)   SpO2 99%   BMI 23.19 kg/m   BP Readings from Last 3 Encounters:  06/24/19 136/88  02/20/19 138/84  08/20/18 (!) 142/80    Wt Readings from Last 3 Encounters:  06/24/19 171 lb (77.6 kg)  02/20/19 172 lb (78 kg)  08/20/18 171 lb (77.6 kg)    Physical Exam Constitutional:      General: He is not in acute distress.    Appearance: He is well-developed.     Comments: NAD  Eyes:     Conjunctiva/sclera: Conjunctivae normal.     Pupils: Pupils are equal, round, and reactive to light.  Neck:     Musculoskeletal: Normal range of motion.     Thyroid: No thyromegaly.     Vascular: No JVD.  Cardiovascular:     Rate and Rhythm: Normal rate and regular rhythm.     Heart sounds: Normal heart sounds. No murmur. No friction rub. No gallop.   Pulmonary:     Effort: Pulmonary effort is normal. No respiratory distress.     Breath sounds: Normal breath sounds. No wheezing or rales.  Chest:     Chest wall: No tenderness.  Abdominal:     General: Bowel sounds are normal. There is no distension.     Palpations: Abdomen is soft. There is no mass.     Tenderness: There is no abdominal tenderness. There is no guarding or rebound.  Musculoskeletal: Normal range of motion.  General: Tenderness present.  Lymphadenopathy:     Cervical: No cervical adenopathy.  Skin:    General: Skin is warm and dry.     Findings: No rash.  Neurological:     Mental Status: He is alert and oriented to person, place, and time.     Cranial Nerves: No cranial nerve deficit.     Motor: Weakness present. No abnormal muscle tone.     Coordination: Coordination abnormal.     Gait: Gait abnormal.     Deep Tendon Reflexes: Reflexes are normal and symmetric.  Psychiatric:        Behavior: Behavior normal.        Thought Content: Thought content normal.        Judgment: Judgment normal.   cane  Lab Results   Component Value Date   WBC 6.5 08/20/2018   HGB 12.5 (L) 08/20/2018   HCT 36.1 (L) 08/20/2018   PLT 167.0 08/20/2018   GLUCOSE 92 02/20/2019   CHOL 157 08/20/2018   TRIG 79.0 08/20/2018   HDL 84.20 08/20/2018   LDLCALC 57 08/20/2018   ALT 31 08/20/2018   AST 87 (H) 08/20/2018   NA 139 02/20/2019   K 3.5 02/20/2019   CL 102 02/20/2019   CREATININE 0.90 02/20/2019   BUN 5 (L) 02/20/2019   CO2 24 02/20/2019   TSH 2.40 08/20/2018   PSA 1.76 08/20/2018   INR 0.91 09/06/2009   HGBA1C 4.6 04/03/2018    No results found.  Assessment & Plan:   There are no diagnoses linked to this encounter.   No orders of the defined types were placed in this encounter.    Follow-up: No follow-ups on file.  Walker Kehr, MD

## 2019-06-24 NOTE — Assessment & Plan Note (Signed)
No change 

## 2019-09-25 ENCOUNTER — Ambulatory Visit (INDEPENDENT_AMBULATORY_CARE_PROVIDER_SITE_OTHER): Payer: Medicare HMO | Admitting: Internal Medicine

## 2019-09-25 ENCOUNTER — Encounter: Payer: Self-pay | Admitting: Internal Medicine

## 2019-09-25 ENCOUNTER — Other Ambulatory Visit: Payer: Self-pay

## 2019-09-25 DIAGNOSIS — E538 Deficiency of other specified B group vitamins: Secondary | ICD-10-CM | POA: Diagnosis not present

## 2019-09-25 DIAGNOSIS — R040 Epistaxis: Secondary | ICD-10-CM | POA: Diagnosis not present

## 2019-09-25 DIAGNOSIS — I10 Essential (primary) hypertension: Secondary | ICD-10-CM

## 2019-09-25 DIAGNOSIS — E559 Vitamin D deficiency, unspecified: Secondary | ICD-10-CM

## 2019-09-25 LAB — BASIC METABOLIC PANEL
BUN: 4 mg/dL — ABNORMAL LOW (ref 6–23)
CO2: 24 mEq/L (ref 19–32)
Calcium: 8.9 mg/dL (ref 8.4–10.5)
Chloride: 101 mEq/L (ref 96–112)
Creatinine, Ser: 0.87 mg/dL (ref 0.40–1.50)
GFR: 107.81 mL/min (ref >60.00)
Glucose, Bld: 99 mg/dL (ref 70–99)
Potassium: 3.4 mEq/L — ABNORMAL LOW (ref 3.5–5.1)
Sodium: 137 mEq/L (ref 135–145)

## 2019-09-25 LAB — VITAMIN B12: Vitamin B-12: 203 pg/mL — ABNORMAL LOW (ref 211–911)

## 2019-09-25 MED ORDER — CEFDINIR 300 MG PO CAPS: 300.0000 mg | ORAL_CAPSULE | Freq: Two times a day (BID) | ORAL | 0 refills | Status: DC

## 2019-09-25 NOTE — Assessment & Plan Note (Signed)
On B12 

## 2019-09-25 NOTE — Assessment & Plan Note (Signed)
Vit D 

## 2019-09-25 NOTE — Progress Notes (Signed)
Subjective:  Patient ID: Johnny Mcknight, male    DOB: 06-07-1956  Age: 64 y.o. MRN: [id number]  CC: No chief complaint on file.   HPI Johnny Mcknight presents for bloody nasal d/c, vit D def, Vit B12 def   Outpatient Medications Prior to Visit  Medication Sig Dispense Refill  . b complex vitamins tablet Take 1 tablet by mouth daily. 100 tablet 3  . Cholecalciferol (VITAMIN D3) 50 MCG (2000 UT) capsule Take 1 capsule (2,000 Units total) by mouth daily. 100 capsule 3  . famotidine (PEPCID) 40 MG tablet Take 1 tablet (40 mg total) by mouth daily. 90 tablet 3  . promethazine (PHENERGAN) 25 MG tablet TAKE 1 TABLET BY MOUTH EVERY 8 HOURS AS NEEDED FOR NAUSEA OR VOMITING OR USE FOR HICCUPS AS NEEDED 60 tablet 1  . Triamcinolone Acetonide (TRIAMCINOLONE 0.1 % CREAM : EUCERIN) CREA Apply 1 application topically 2 (two) times daily as needed for rash, itching or irritation. 1 each 3   No facility-administered medications prior to visit.    ROS: Review of Systems  Constitutional: Negative for appetite change, fatigue and unexpected weight change.  HENT: Negative for congestion, nosebleeds, sneezing, sore throat and trouble swallowing.   Eyes: Negative for itching and visual disturbance.  Respiratory: Negative for cough.   Cardiovascular: Negative for chest pain, palpitations and leg swelling.  Gastrointestinal: Positive for nausea. Negative for abdominal distention, blood in stool and diarrhea.  Genitourinary: Negative for frequency and hematuria.  Musculoskeletal: Positive for gait problem. Negative for back pain, joint swelling and neck pain.  Skin: Negative for rash.  Neurological: Positive for tremors. Negative for dizziness, speech difficulty and weakness.  Psychiatric/Behavioral: Positive for decreased concentration. Negative for agitation, dysphoric mood and sleep disturbance. The patient is not nervous/anxious.     Objective:  BP (!) 142/80 (BP Location: Left Arm, Patient Position: Sitting,  Cuff Size: Large)   Pulse (!) 113   Temp 98 F (36.7 C) (Oral)   Ht 6' (1.829 m)   Wt 176 lb (79.8 kg)   SpO2 96%   BMI 23.87 kg/m   BP Readings from Last 3 Encounters:  09/25/19 (!) 142/80  06/24/19 136/88  02/20/19 138/84    Wt Readings from Last 3 Encounters:  09/25/19 176 lb (79.8 kg)  06/24/19 171 lb (77.6 kg)  02/20/19 172 lb (78 kg)    Physical Exam Constitutional:      General: He is not in acute distress.    Appearance: He is well-developed.     Comments: NAD  Eyes:     Conjunctiva/sclera: Conjunctivae normal.     Pupils: Pupils are equal, round, and reactive to light.  Neck:     Thyroid: No thyromegaly.     Vascular: No JVD.  Cardiovascular:     Rate and Rhythm: Normal rate and regular rhythm.     Heart sounds: Normal heart sounds. No murmur. No friction rub. No gallop.   Pulmonary:     Effort: Pulmonary effort is normal. No respiratory distress.     Breath sounds: Normal breath sounds. No wheezing or rales.  Chest:     Chest wall: No tenderness.  Abdominal:     General: Bowel sounds are normal. There is no distension.     Palpations: Abdomen is soft. There is no mass.     Tenderness: There is no abdominal tenderness. There is no guarding or rebound.  Musculoskeletal:        General: No tenderness. Normal range of motion.  Cervical back: Normal range of motion.  Lymphadenopathy:     Cervical: No cervical adenopathy.  Skin:    General: Skin is warm and dry.     Findings: No rash.  Neurological:     Mental Status: He is alert and oriented to person, place, and time.     Cranial Nerves: No cranial nerve deficit.     Motor: No abnormal muscle tone.     Coordination: Coordination normal.     Gait: Gait abnormal.     Deep Tendon Reflexes: Reflexes are normal and symmetric.  Psychiatric:        Behavior: Behavior normal.        Thought Content: Thought content normal.        Judgment: Judgment normal.   cane  Lab Results  Component Value Date    WBC 6.5 08/20/2018   HGB 12.5 (L) 08/20/2018   HCT 36.1 (L) 08/20/2018   PLT 167.0 08/20/2018   GLUCOSE 92 02/20/2019   CHOL 157 08/20/2018   TRIG 79.0 08/20/2018   HDL 84.20 08/20/2018   LDLCALC 57 08/20/2018   ALT 31 08/20/2018   AST 87 (H) 08/20/2018   NA 139 02/20/2019   K 3.5 02/20/2019   CL 102 02/20/2019   CREATININE 0.90 02/20/2019   BUN 5 (L) 02/20/2019   CO2 24 02/20/2019   TSH 2.40 08/20/2018   PSA 1.76 08/20/2018   INR 0.91 09/06/2009   HGBA1C 4.6 04/03/2018    No results found.  Assessment & Plan:   There are no diagnoses linked to this encounter.   Meds ordered this encounter  Medications  . cefdinir (OMNICEF) 300 MG capsule    Sig: Take 1 capsule (300 mg total) by mouth 2 (two) times daily.    Dispense:  20 capsule    Refill:  0     Follow-up: No follow-ups on file.  Walker Kehr, MD

## 2019-09-25 NOTE — Assessment & Plan Note (Signed)
NAS diet 

## 2019-09-25 NOTE — Assessment & Plan Note (Addendum)
x 2 mo - r/o sinusitis Cefdinir ENT ref if not better

## 2019-11-21 ENCOUNTER — Other Ambulatory Visit: Payer: Self-pay | Admitting: Internal Medicine

## 2020-01-17 ENCOUNTER — Other Ambulatory Visit: Payer: Self-pay | Admitting: Internal Medicine

## 2020-03-18 ENCOUNTER — Other Ambulatory Visit: Payer: Self-pay | Admitting: Internal Medicine

## 2020-03-24 ENCOUNTER — Other Ambulatory Visit: Payer: Self-pay

## 2020-03-24 ENCOUNTER — Ambulatory Visit (INDEPENDENT_AMBULATORY_CARE_PROVIDER_SITE_OTHER): Payer: Medicare HMO | Admitting: Internal Medicine

## 2020-03-24 ENCOUNTER — Ambulatory Visit (INDEPENDENT_AMBULATORY_CARE_PROVIDER_SITE_OTHER): Payer: Medicare HMO

## 2020-03-24 ENCOUNTER — Encounter: Payer: Self-pay | Admitting: Internal Medicine

## 2020-03-24 VITALS — BP 142/86 | HR 94 | Temp 99.3°F | Wt 170.4 lb

## 2020-03-24 DIAGNOSIS — M25562 Pain in left knee: Secondary | ICD-10-CM

## 2020-03-24 DIAGNOSIS — M25561 Pain in right knee: Secondary | ICD-10-CM | POA: Diagnosis not present

## 2020-03-24 DIAGNOSIS — D539 Nutritional anemia, unspecified: Secondary | ICD-10-CM | POA: Diagnosis not present

## 2020-03-24 DIAGNOSIS — E876 Hypokalemia: Secondary | ICD-10-CM | POA: Diagnosis not present

## 2020-03-24 DIAGNOSIS — E538 Deficiency of other specified B group vitamins: Secondary | ICD-10-CM | POA: Diagnosis not present

## 2020-03-24 DIAGNOSIS — L853 Xerosis cutis: Secondary | ICD-10-CM

## 2020-03-24 DIAGNOSIS — G621 Alcoholic polyneuropathy: Secondary | ICD-10-CM | POA: Diagnosis not present

## 2020-03-24 DIAGNOSIS — E559 Vitamin D deficiency, unspecified: Secondary | ICD-10-CM

## 2020-03-24 DIAGNOSIS — G8929 Other chronic pain: Secondary | ICD-10-CM

## 2020-03-24 DIAGNOSIS — I10 Essential (primary) hypertension: Secondary | ICD-10-CM | POA: Diagnosis not present

## 2020-03-24 LAB — BASIC METABOLIC PANEL WITH GFR
BUN: 7 mg/dL (ref 6–23)
CO2: 22 meq/L (ref 19–32)
Calcium: 9.3 mg/dL (ref 8.4–10.5)
Chloride: 104 meq/L (ref 96–112)
Creatinine, Ser: 0.82 mg/dL (ref 0.40–1.50)
GFR: 115.24 mL/min
Glucose, Bld: 95 mg/dL (ref 70–99)
Potassium: 3.7 meq/L (ref 3.5–5.1)
Sodium: 139 meq/L (ref 135–145)

## 2020-03-24 LAB — HEPATIC FUNCTION PANEL
ALT: 12 U/L (ref 0–53)
AST: 52 U/L — ABNORMAL HIGH (ref 0–37)
Albumin: 4.1 g/dL (ref 3.5–5.2)
Alkaline Phosphatase: 141 U/L — ABNORMAL HIGH (ref 39–117)
Bilirubin, Direct: 0.5 mg/dL — ABNORMAL HIGH (ref 0.0–0.3)
Total Bilirubin: 1.3 mg/dL — ABNORMAL HIGH (ref 0.2–1.2)
Total Protein: 8.2 g/dL (ref 6.0–8.3)

## 2020-03-24 LAB — TSH: TSH: 2.43 u[IU]/mL (ref 0.35–4.50)

## 2020-03-24 LAB — VITAMIN B12: Vitamin B-12: 163 pg/mL — ABNORMAL LOW (ref 211–911)

## 2020-03-24 MED ORDER — LIDOCAINE HCL (PF) 1 % IJ SOLN
3.0000 mL | Freq: Once | INTRAMUSCULAR | Status: AC
  Administered 2020-03-24: 3 mL via INTRADERMAL

## 2020-03-24 MED ORDER — METHYLPREDNISOLONE ACETATE 40 MG/ML IJ SUSP
40.0000 mg | Freq: Once | INTRAMUSCULAR | Status: AC
  Administered 2020-03-24: 40 mg via INTRAMUSCULAR

## 2020-03-24 MED ORDER — PROMETHAZINE HCL 25 MG PO TABS: ORAL_TABLET | ORAL | 0 refills | Status: DC

## 2020-03-24 MED ORDER — VITAMIN B-12 1000 MCG SL SUBL: 1.0000 | SUBLINGUAL_TABLET | Freq: Every day | SUBLINGUAL | 3 refills | Status: DC

## 2020-03-24 NOTE — Assessment & Plan Note (Signed)
  BP Readings from Last 3 Encounters:  03/24/20 (!) 142/86  09/25/19 (!) 142/80  06/24/19 136/88

## 2020-03-24 NOTE — Assessment & Plan Note (Signed)
Labs

## 2020-03-24 NOTE — Assessment & Plan Note (Signed)
On Vit D 

## 2020-03-24 NOTE — Assessment & Plan Note (Signed)
Scaly skin B complex or MVI

## 2020-03-24 NOTE — Assessment & Plan Note (Signed)
CBC

## 2020-03-24 NOTE — Patient Instructions (Signed)
Postprocedure instructions :    A Band-Aid should be left on for 12 hours. Injection therapy is not a cure itself. It is used in conjunction with other modalities. You can use nonsteroidal anti-inflammatories like ibuprofen , hot and cold compresses. Rest is recommended in the next 24 hours. You need to report immediately  if fever, chills or any signs of infection develop. 

## 2020-03-24 NOTE — Assessment & Plan Note (Signed)
Discussed  Inj offered

## 2020-03-24 NOTE — Assessment & Plan Note (Signed)
On B12 po Repeat labs

## 2020-03-24 NOTE — Progress Notes (Signed)
Subjective:  Patient ID: Johnny Mcknight, male    DOB: [birth year]  Age: 64 y.o. MRN: [id number]  CC: Diabetes (6 MONTH FOLLOW-UP- Req refill on his promethazine)   HPI Johnny Mcknight presents for R knee pain - worse; B12 def, HTN, nausea f/u  Outpatient Medications Prior to Visit  Medication Sig Dispense Refill  . b complex vitamins tablet Take 1 tablet by mouth daily. 100 tablet 3  . Cholecalciferol (VITAMIN D3) 50 MCG (2000 UT) capsule Take 1 capsule (2,000 Units total) by mouth daily. 100 capsule 3  . famotidine (PEPCID) 40 MG tablet Take 1 tablet (40 mg total) by mouth daily. 90 tablet 3  . Triamcinolone Acetonide (TRIAMCINOLONE 0.1 % CREAM : EUCERIN) CREA Apply 1 application topically 2 (two) times daily as needed for rash, itching or irritation. 1 each 3  . promethazine (PHENERGAN) 25 MG tablet TAKE 1 TABLET BY MOUTH EVERY 8 HOURS AS NEEDED FOR NAUSEA OR VOMITING OR USE FOR HICCUPS 60 tablet 0  . cefdinir (OMNICEF) 300 MG capsule Take 1 capsule (300 mg total) by mouth 2 (two) times daily. (Patient not taking: Reported on 03/24/2020) 20 capsule 0   No facility-administered medications prior to visit.    ROS: Review of Systems  Constitutional: Negative for appetite change, fatigue and unexpected weight change.  HENT: Negative for congestion, nosebleeds, sneezing, sore throat and trouble swallowing.   Eyes: Negative for itching and visual disturbance.  Respiratory: Negative for cough.   Cardiovascular: Negative for chest pain, palpitations and leg swelling.  Gastrointestinal: Negative for abdominal distention, blood in stool, diarrhea and nausea.  Genitourinary: Negative for frequency and hematuria.  Musculoskeletal: Positive for arthralgias. Negative for back pain, gait problem, joint swelling and neck pain.  Skin: Negative for rash.  Neurological: Positive for numbness. Negative for dizziness, tremors, speech difficulty and weakness.  Psychiatric/Behavioral: Negative for agitation,  dysphoric mood and sleep disturbance. The patient is nervous/anxious.     Objective:  BP (!) 142/86 (BP Location: Left Arm)   Pulse 94   Temp 99.3 F (37.4 C) (Oral)   Wt 170 lb 6.4 oz (77.3 kg)   BMI 23.11 kg/m   BP Readings from Last 3 Encounters:  03/24/20 (!) 142/86  09/25/19 (!) 142/80  06/24/19 136/88    Wt Readings from Last 3 Encounters:  03/24/20 170 lb 6.4 oz (77.3 kg)  09/25/19 176 lb (79.8 kg)  06/24/19 171 lb (77.6 kg)    Physical Exam Constitutional:      General: He is not in acute distress.    Appearance: He is well-developed.     Comments: NAD  Eyes:     Conjunctiva/sclera: Conjunctivae normal.     Pupils: Pupils are equal, round, and reactive to light.  Neck:     Thyroid: No thyromegaly.     Vascular: No JVD.  Cardiovascular:     Rate and Rhythm: Normal rate and regular rhythm.     Heart sounds: Normal heart sounds. No murmur heard.  No friction rub. No gallop.   Pulmonary:     Effort: Pulmonary effort is normal. No respiratory distress.     Breath sounds: Normal breath sounds. No wheezing or rales.  Chest:     Chest wall: No tenderness.  Abdominal:     General: Bowel sounds are normal. There is no distension.     Palpations: Abdomen is soft. There is no mass.     Tenderness: There is no abdominal tenderness. There is no guarding or rebound.  Musculoskeletal:        General: No tenderness. Normal range of motion.     Cervical back: Normal range of motion.  Lymphadenopathy:     Cervical: No cervical adenopathy.  Skin:    General: Skin is warm and dry.     Findings: No rash.  Neurological:     Mental Status: He is alert and oriented to person, place, and time.     Cranial Nerves: No cranial nerve deficit.     Motor: No abnormal muscle tone.     Coordination: Coordination normal.     Gait: Gait normal.     Deep Tendon Reflexes: Reflexes are normal and symmetric.  Psychiatric:        Behavior: Behavior normal.        Thought Content:  Thought content normal.        Judgment: Judgment normal.    Cane R knee w/ much pain Very dry scaly skin R knee - very tender    Procedure Note :     Procedure : Joint Injection, R  knee   Indication:  Joint osteoarthritis with refractory  chronic pain.   Risks including unsuccessful procedure , bleeding, infection, bruising, skin atrophy, "steroid flare-up" and others were explained to the patient in detail as well as the benefits. Informed consent was obtained and signed.   Tthe patient was placed in a comfortable position. Lateral approach was used. Skin was prepped with Betadine and alcohol  and anesthetized a cooling spray. Then, a 5 cc syringe with a 1.5 inch long 25-gauge needle was used for a joint injection.. The needle was advanced  Into the knee joint cavity. I aspirated a small amount of intra-articular fluid to confirm correct placement of the needle and injected the joint with 5 mL of 2% lidocaine and 40 mg of Depo-Medrol .  Band-Aid was applied.   Tolerated well. Complications: None. Good pain relief following the procedure.   Postprocedure instructions :    A Band-Aid should be left on for 12 hours. Injection therapy is not a cure itself. It is used in conjunction with other modalities. You can use nonsteroidal anti-inflammatories like ibuprofen , hot and cold compresses. Rest is recommended in the next 24 hours. You need to report immediately  if fever, chills or any signs of infection develop.    Lab Results  Component Value Date   WBC 6.5 08/20/2018   HGB 12.5 (L) 08/20/2018   HCT 36.1 (L) 08/20/2018   PLT 167.0 08/20/2018   GLUCOSE 99 09/25/2019   CHOL 157 08/20/2018   TRIG 79.0 08/20/2018   HDL 84.20 08/20/2018   LDLCALC 57 08/20/2018   ALT 31 08/20/2018   AST 87 (H) 08/20/2018   NA 137 09/25/2019   K 3.4 (L) 09/25/2019   CL 101 09/25/2019   CREATININE 0.87 09/25/2019   BUN 4 (L) 09/25/2019   CO2 24 09/25/2019   TSH 2.40 08/20/2018   PSA 1.76  08/20/2018   INR 0.91 09/06/2009   HGBA1C 4.6 04/03/2018    No results found.  Assessment & Plan:   There are no diagnoses linked to this encounter.   Meds ordered this encounter  Medications  . promethazine (PHENERGAN) 25 MG tablet    Sig: TAKE 1 TABLET BY MOUTH EVERY 8 HOURS AS NEEDED FOR NAUSEA OR VOMITING OR USE FOR HICCUPS    Dispense:  60 tablet    Refill:  0     Follow-up: No follow-ups on file.  Walker Kehr, MD

## 2020-03-30 LAB — VITAMIN B1, WHOLE BLOOD: Vitamin B1 (Thiamine), Blood: 95 nmol/L (ref 78–185)

## 2020-03-30 LAB — ZINC: Zinc: 47 ug/dL — ABNORMAL LOW (ref 60–130)

## 2020-04-03 ENCOUNTER — Encounter: Payer: Self-pay | Admitting: Internal Medicine

## 2020-04-03 ENCOUNTER — Other Ambulatory Visit: Payer: Self-pay | Admitting: Internal Medicine

## 2020-04-03 DIAGNOSIS — E60 Dietary zinc deficiency: Secondary | ICD-10-CM | POA: Insufficient documentation

## 2020-04-03 HISTORY — DX: Dietary zinc deficiency: E60

## 2020-04-03 MED ORDER — ZINC SULFATE 50 MG PO CAPS: 220.0000 mg | ORAL_CAPSULE | Freq: Two times a day (BID) | ORAL | 0 refills | Status: DC

## 2020-04-08 ENCOUNTER — Ambulatory Visit (INDEPENDENT_AMBULATORY_CARE_PROVIDER_SITE_OTHER): Payer: Medicare HMO | Admitting: *Deleted

## 2020-04-08 ENCOUNTER — Other Ambulatory Visit: Payer: Self-pay

## 2020-04-08 DIAGNOSIS — E538 Deficiency of other specified B group vitamins: Secondary | ICD-10-CM

## 2020-04-08 MED ORDER — CYANOCOBALAMIN 1000 MCG/ML IJ SOLN
1000.0000 ug | Freq: Once | INTRAMUSCULAR | Status: AC
  Administered 2020-04-08: 1000 ug via INTRAMUSCULAR

## 2020-04-08 NOTE — Progress Notes (Addendum)
Pls cosign for B12 inj../lmb   Medical screening examination/treatment/procedure(s) were performed by non-physician practitioner and as supervising physician I was immediately available for consultation/collaboration.  I agree with above. Aleksei Plotnikov, MD  

## 2020-05-08 ENCOUNTER — Ambulatory Visit (INDEPENDENT_AMBULATORY_CARE_PROVIDER_SITE_OTHER): Payer: Medicare HMO | Admitting: General Practice

## 2020-05-08 ENCOUNTER — Other Ambulatory Visit: Payer: Self-pay

## 2020-05-08 DIAGNOSIS — E538 Deficiency of other specified B group vitamins: Secondary | ICD-10-CM | POA: Diagnosis not present

## 2020-05-08 MED ORDER — CYANOCOBALAMIN 1000 MCG/ML IJ SOLN
1000.0000 ug | Freq: Once | INTRAMUSCULAR | Status: AC
  Administered 2020-05-08: 1000 ug via INTRAMUSCULAR

## 2020-05-10 NOTE — Progress Notes (Signed)
b12 Injection given.   Johnny Mcknight Johnny Roc Streett, MD  

## 2020-06-08 ENCOUNTER — Ambulatory Visit (INDEPENDENT_AMBULATORY_CARE_PROVIDER_SITE_OTHER): Payer: Medicare HMO

## 2020-06-08 ENCOUNTER — Other Ambulatory Visit: Payer: Self-pay

## 2020-06-08 DIAGNOSIS — E538 Deficiency of other specified B group vitamins: Secondary | ICD-10-CM

## 2020-06-08 MED ORDER — CYANOCOBALAMIN 1000 MCG/ML IJ SOLN
1000.0000 ug | Freq: Once | INTRAMUSCULAR | Status: AC
  Administered 2020-06-08: 1000 ug via INTRAMUSCULAR

## 2020-06-09 NOTE — Progress Notes (Addendum)
b12 given. Please co sign.  Medical screening examination/treatment/procedure(s) were performed by non-physician practitioner and as supervising physician I was immediately available for consultation/collaboration.  I agree with above. Lew Dawes, MD

## 2020-06-24 ENCOUNTER — Encounter: Payer: Self-pay | Admitting: Internal Medicine

## 2020-06-24 ENCOUNTER — Other Ambulatory Visit: Payer: Self-pay

## 2020-06-24 ENCOUNTER — Ambulatory Visit (INDEPENDENT_AMBULATORY_CARE_PROVIDER_SITE_OTHER): Payer: Medicare HMO | Admitting: Internal Medicine

## 2020-06-24 DIAGNOSIS — G8929 Other chronic pain: Secondary | ICD-10-CM

## 2020-06-24 DIAGNOSIS — M25561 Pain in right knee: Secondary | ICD-10-CM

## 2020-06-24 DIAGNOSIS — E60 Dietary zinc deficiency: Secondary | ICD-10-CM | POA: Diagnosis not present

## 2020-06-24 DIAGNOSIS — E538 Deficiency of other specified B group vitamins: Secondary | ICD-10-CM

## 2020-06-24 DIAGNOSIS — M25562 Pain in left knee: Secondary | ICD-10-CM

## 2020-06-24 DIAGNOSIS — E559 Vitamin D deficiency, unspecified: Secondary | ICD-10-CM | POA: Diagnosis not present

## 2020-06-24 MED ORDER — DICLOFENAC SODIUM 1 % EX GEL: 2.0000 g | Freq: Four times a day (QID) | CUTANEOUS | 3 refills | Status: DC

## 2020-06-24 MED ORDER — CYANOCOBALAMIN 1000 MCG/ML IJ SOLN
1000.0000 ug | Freq: Once | INTRAMUSCULAR | Status: AC
  Administered 2020-06-24: 1000 ug via INTRAMUSCULAR

## 2020-06-24 NOTE — Addendum Note (Signed)
Addended by: Rolm Baptise on: 06/24/2020 01:21 PM   Modules accepted: Orders

## 2020-06-24 NOTE — Assessment & Plan Note (Signed)
R>>L refractory Ortho ref Voltaren gel

## 2020-06-24 NOTE — Progress Notes (Signed)
Subjective:  Patient ID: Johnny Mcknight, male    DOB: 12-29-1955  Age: 64 y.o. MRN: [id number]  CC: Follow-up   HPI Johnny Mcknight presents for R knee pain - not better F/u B12 def, GERD  Outpatient Medications Prior to Visit  Medication Sig Dispense Refill  . b complex vitamins tablet Take 1 tablet by mouth daily. 100 tablet 3  . Cholecalciferol (VITAMIN D3) 50 MCG (2000 UT) capsule Take 1 capsule (2,000 Units total) by mouth daily. 100 capsule 3  . famotidine (PEPCID) 40 MG tablet Take 1 tablet (40 mg total) by mouth daily. 90 tablet 3  . promethazine (PHENERGAN) 25 MG tablet TAKE 1 TABLET BY MOUTH EVERY 8 HOURS AS NEEDED FOR NAUSEA OR VOMITING OR USE FOR HICCUPS 60 tablet 0  . Triamcinolone Acetonide (TRIAMCINOLONE 0.1 % CREAM : EUCERIN) CREA Apply 1 application topically 2 (two) times daily as needed for rash, itching or irritation. 1 each 3  . zinc sulfate 50 MG CAPS capsule Take 1 capsule (220 mg total) by mouth 2 (two) times daily. 180 capsule 0  . Cyanocobalamin (VITAMIN B-12) 1000 MCG SUBL Place 1 tablet (1,000 mcg total) under the tongue daily. (Patient not taking: Reported on 06/24/2020) 100 tablet 3   No facility-administered medications prior to visit.    ROS: Review of Systems  Constitutional: Negative for appetite change, fatigue and unexpected weight change.  HENT: Negative for congestion, nosebleeds, sneezing, sore throat and trouble swallowing.   Eyes: Negative for itching and visual disturbance.  Respiratory: Negative for cough.   Cardiovascular: Negative for chest pain, palpitations and leg swelling.  Gastrointestinal: Negative for abdominal distention, blood in stool, diarrhea and nausea.  Genitourinary: Negative for frequency and hematuria.  Musculoskeletal: Positive for arthralgias and gait problem. Negative for joint swelling and neck pain.  Skin: Negative for rash.  Neurological: Negative for dizziness, tremors, speech difficulty and weakness.   Psychiatric/Behavioral: Negative for agitation, dysphoric mood and sleep disturbance. The patient is not nervous/anxious.     Objective:  BP 130/74 (BP Location: Left Arm, Patient Position: Sitting, Cuff Size: Normal)   Pulse 96   Temp 98.8 F (37.1 C) (Oral)   Ht 6' (1.829 m)   Wt 167 lb (75.8 kg)   SpO2 99%   BMI 22.65 kg/m   BP Readings from Last 3 Encounters:  06/24/20 130/74  03/24/20 (!) 142/86  09/25/19 (!) 142/80    Wt Readings from Last 3 Encounters:  06/24/20 167 lb (75.8 kg)  03/24/20 170 lb 6.4 oz (77.3 kg)  09/25/19 176 lb (79.8 kg)    Physical Exam Constitutional:      General: He is not in acute distress.    Appearance: He is well-developed.     Comments: NAD  Eyes:     Conjunctiva/sclera: Conjunctivae normal.     Pupils: Pupils are equal, round, and reactive to light.  Neck:     Thyroid: No thyromegaly.     Vascular: No JVD.  Cardiovascular:     Rate and Rhythm: Normal rate and regular rhythm.     Heart sounds: Normal heart sounds. No murmur heard.  No friction rub. No gallop.   Pulmonary:     Effort: Pulmonary effort is normal. No respiratory distress.     Breath sounds: Normal breath sounds. No wheezing or rales.  Chest:     Chest wall: No tenderness.  Abdominal:     General: Bowel sounds are normal. There is no distension.     Palpations:  Abdomen is soft. There is no mass.     Tenderness: There is no abdominal tenderness. There is no guarding or rebound.  Musculoskeletal:        General: Tenderness present. Normal range of motion.     Cervical back: Normal range of motion.  Lymphadenopathy:     Cervical: No cervical adenopathy.  Skin:    General: Skin is warm and dry.     Findings: No rash.  Neurological:     Mental Status: He is alert and oriented to person, place, and time.     Cranial Nerves: No cranial nerve deficit.     Motor: No abnormal muscle tone.     Coordination: Coordination normal.     Gait: Gait abnormal.     Deep  Tendon Reflexes: Reflexes are normal and symmetric.  Psychiatric:        Behavior: Behavior normal.        Thought Content: Thought content normal.        Judgment: Judgment normal.   R knee - pain w/ROM Cane  Lab Results  Component Value Date   WBC 6.5 08/20/2018   HGB 12.5 (L) 08/20/2018   HCT 36.1 (L) 08/20/2018   PLT 167.0 08/20/2018   GLUCOSE 95 03/24/2020   CHOL 157 08/20/2018   TRIG 79.0 08/20/2018   HDL 84.20 08/20/2018   LDLCALC 57 08/20/2018   ALT 12 03/24/2020   AST 52 (H) 03/24/2020   NA 139 03/24/2020   K 3.7 03/24/2020   CL 104 03/24/2020   CREATININE 0.82 03/24/2020   BUN 7 03/24/2020   CO2 22 03/24/2020   TSH 2.43 03/24/2020   PSA 1.76 08/20/2018   INR 0.91 09/06/2009   HGBA1C 4.6 04/03/2018    No results found.  Assessment & Plan:   There are no diagnoses linked to this encounter.   No orders of the defined types were placed in this encounter.    Follow-up: No follow-ups on file.  Walker Kehr, MD

## 2020-06-24 NOTE — Assessment & Plan Note (Signed)
Zinc PO

## 2020-06-24 NOTE — Assessment & Plan Note (Signed)
B12 inj monthly

## 2020-06-24 NOTE — Patient Instructions (Signed)
Elastic knee brace 

## 2020-06-24 NOTE — Assessment & Plan Note (Signed)
Vit D 

## 2020-07-10 ENCOUNTER — Other Ambulatory Visit: Payer: Self-pay | Admitting: Internal Medicine

## 2020-09-20 ENCOUNTER — Other Ambulatory Visit: Payer: Self-pay | Admitting: Internal Medicine

## 2020-09-23 ENCOUNTER — Other Ambulatory Visit: Payer: Self-pay

## 2020-09-24 ENCOUNTER — Encounter: Payer: Self-pay | Admitting: Internal Medicine

## 2020-09-24 ENCOUNTER — Ambulatory Visit (INDEPENDENT_AMBULATORY_CARE_PROVIDER_SITE_OTHER): Payer: Medicare HMO | Admitting: Internal Medicine

## 2020-09-24 VITALS — BP 140/82 | HR 96 | Temp 99.4°F | Wt 172.2 lb

## 2020-09-24 DIAGNOSIS — F10288 Alcohol dependence with other alcohol-induced disorder: Secondary | ICD-10-CM

## 2020-09-24 DIAGNOSIS — G621 Alcoholic polyneuropathy: Secondary | ICD-10-CM

## 2020-09-24 DIAGNOSIS — E538 Deficiency of other specified B group vitamins: Secondary | ICD-10-CM

## 2020-09-24 DIAGNOSIS — K219 Gastro-esophageal reflux disease without esophagitis: Secondary | ICD-10-CM

## 2020-09-24 DIAGNOSIS — Z23 Encounter for immunization: Secondary | ICD-10-CM | POA: Diagnosis not present

## 2020-09-24 DIAGNOSIS — K7010 Alcoholic hepatitis without ascites: Secondary | ICD-10-CM | POA: Diagnosis not present

## 2020-09-24 DIAGNOSIS — I10 Essential (primary) hypertension: Secondary | ICD-10-CM

## 2020-09-24 DIAGNOSIS — Z9119 Patient's noncompliance with other medical treatment and regimen: Secondary | ICD-10-CM

## 2020-09-24 DIAGNOSIS — E60 Dietary zinc deficiency: Secondary | ICD-10-CM | POA: Diagnosis not present

## 2020-09-24 DIAGNOSIS — D696 Thrombocytopenia, unspecified: Secondary | ICD-10-CM | POA: Diagnosis not present

## 2020-09-24 DIAGNOSIS — Z91199 Patient's noncompliance with other medical treatment and regimen due to unspecified reason: Secondary | ICD-10-CM

## 2020-09-24 DIAGNOSIS — R110 Nausea: Secondary | ICD-10-CM | POA: Insufficient documentation

## 2020-09-24 LAB — CBC WITH DIFFERENTIAL/PLATELET
Basophils Absolute: 0 10*3/uL (ref 0.0–0.1)
Basophils Relative: 0.4 % (ref 0.0–3.0)
Eosinophils Absolute: 0.2 10*3/uL (ref 0.0–0.7)
Eosinophils Relative: 1.8 % (ref 0.0–5.0)
HCT: 36.5 % — ABNORMAL LOW (ref 39.0–52.0)
Hemoglobin: 12.3 g/dL — ABNORMAL LOW (ref 13.0–17.0)
Lymphocytes Relative: 12.1 % (ref 12.0–46.0)
Lymphs Abs: 1 10*3/uL (ref 0.7–4.0)
MCHC: 33.7 g/dL (ref 30.0–36.0)
MCV: 104.5 fl — ABNORMAL HIGH (ref 78.0–100.0)
Monocytes Absolute: 0.8 10*3/uL (ref 0.1–1.0)
Monocytes Relative: 9.9 % (ref 3.0–12.0)
Neutro Abs: 6.5 10*3/uL (ref 1.4–7.7)
Neutrophils Relative %: 75.8 % (ref 43.0–77.0)
Platelets: 184 10*3/uL (ref 150.0–400.0)
RBC: 3.49 Mil/uL — ABNORMAL LOW (ref 4.22–5.81)
RDW: 14.6 % (ref 11.5–15.5)
WBC: 8.6 10*3/uL (ref 4.0–10.5)

## 2020-09-24 LAB — HEPATIC FUNCTION PANEL
ALT: 15 U/L (ref 0–53)
AST: 55 U/L — ABNORMAL HIGH (ref 0–37)
Albumin: 4 g/dL (ref 3.5–5.2)
Alkaline Phosphatase: 232 U/L — ABNORMAL HIGH (ref 39–117)
Bilirubin, Direct: 0.6 mg/dL — ABNORMAL HIGH (ref 0.0–0.3)
Total Bilirubin: 1.7 mg/dL — ABNORMAL HIGH (ref 0.2–1.2)
Total Protein: 8.7 g/dL — ABNORMAL HIGH (ref 6.0–8.3)

## 2020-09-24 LAB — VITAMIN B12: Vitamin B-12: 424 pg/mL (ref 211–911)

## 2020-09-24 MED ORDER — PROMETHAZINE HCL 25 MG PO TABS
ORAL_TABLET | ORAL | 3 refills | Status: DC
Start: 2020-09-24 — End: 2021-04-26

## 2020-09-24 NOTE — Assessment & Plan Note (Addendum)
No change On Vit B complex po Cane

## 2020-09-24 NOTE — Assessment & Plan Note (Signed)
Re-start Zinc 220 mg bid

## 2020-09-24 NOTE — Progress Notes (Signed)
Subjective:  Patient ID: Johnny Mcknight, male    DOB: 06-03-1956  Age: 65 y.o. MRN: [id number]  CC: Follow-up (3 month f/u- Req refill on his promethazine)   HPI Johnny Mcknight presents for nausea, neuropathy, HTN, B12 def f/u. C/o unsteady gait. No falls  Outpatient Medications Prior to Visit  Medication Sig Dispense Refill  . b complex vitamins tablet Take 1 tablet by mouth daily. 100 tablet 3  . Cholecalciferol (VITAMIN D3) 50 MCG (2000 UT) capsule Take 1 capsule (2,000 Units total) by mouth daily. 100 capsule 3  . Cyanocobalamin (VITAMIN B-12) 1000 MCG SUBL Place 1 tablet (1,000 mcg total) under the tongue daily. 100 tablet 3  . diclofenac Sodium (VOLTAREN) 1 % GEL Apply 2 g topically 4 (four) times daily. 100 g 3  . famotidine (PEPCID) 40 MG tablet Take 1 tablet (40 mg total) by mouth daily. 90 tablet 3  . Triamcinolone Acetonide (TRIAMCINOLONE 0.1 % CREAM : EUCERIN) CREA Apply 1 application topically 2 (two) times daily as needed for rash, itching or irritation. 1 each 3  . zinc sulfate 50 MG CAPS capsule Take 1 capsule (220 mg total) by mouth 2 (two) times daily. 180 capsule 0  . promethazine (PHENERGAN) 25 MG tablet TAKE 1 TABLET BY MOUTH EVERY 8 HOURS AS NEEDED FOR NAUSEA FOR VOMITING OR  USE  FOR  HICCUPS 60 tablet 0   No facility-administered medications prior to visit.    ROS: Review of Systems  Constitutional: Positive for fatigue. Negative for appetite change and unexpected weight change.  HENT: Negative for congestion, nosebleeds, sneezing, sore throat and trouble swallowing.   Eyes: Negative for itching and visual disturbance.  Respiratory: Negative for cough.   Cardiovascular: Negative for chest pain, palpitations and leg swelling.  Gastrointestinal: Negative for abdominal distention, blood in stool, diarrhea and nausea.  Genitourinary: Negative for frequency and hematuria.  Musculoskeletal: Positive for arthralgias and gait problem. Negative for back pain, joint swelling  and neck pain.  Skin: Negative for rash.  Neurological: Positive for weakness. Negative for dizziness, tremors and speech difficulty.  Psychiatric/Behavioral: Negative for agitation, dysphoric mood and sleep disturbance. The patient is not nervous/anxious.     Objective:  BP 140/82 (BP Location: Left Arm)   Pulse 96   Temp 99.4 F (37.4 C) (Oral)   Wt 172 lb 3.2 oz (78.1 kg)   SpO2 97%   BMI 23.35 kg/m   BP Readings from Last 3 Encounters:  09/24/20 140/82  06/24/20 130/74  03/24/20 (!) 142/86    Wt Readings from Last 3 Encounters:  09/24/20 172 lb 3.2 oz (78.1 kg)  06/24/20 167 lb (75.8 kg)  03/24/20 170 lb 6.4 oz (77.3 kg)    Physical Exam Constitutional:      General: He is not in acute distress.    Appearance: He is well-developed.     Comments: NAD  HENT:     Mouth/Throat:     Mouth: Oropharynx is clear and moist.  Eyes:     Conjunctiva/sclera: Conjunctivae normal.     Pupils: Pupils are equal, round, and reactive to light.  Neck:     Thyroid: No thyromegaly.     Vascular: No JVD.  Cardiovascular:     Rate and Rhythm: Normal rate and regular rhythm.     Pulses: Intact distal pulses.     Heart sounds: Normal heart sounds. No murmur heard. No friction rub. No gallop.   Pulmonary:     Effort: Pulmonary effort is normal.  No respiratory distress.     Breath sounds: Normal breath sounds. No wheezing or rales.  Chest:     Chest wall: No tenderness.  Abdominal:     General: Bowel sounds are normal. There is no distension.     Palpations: Abdomen is soft. There is no mass.     Tenderness: There is no abdominal tenderness. There is no guarding or rebound.  Musculoskeletal:        General: No tenderness or edema. Normal range of motion.     Cervical back: Normal range of motion.  Lymphadenopathy:     Cervical: No cervical adenopathy.  Skin:    General: Skin is warm and dry.     Findings: No rash.  Neurological:     Mental Status: He is alert and oriented to  person, place, and time.     Cranial Nerves: No cranial nerve deficit.     Motor: No abnormal muscle tone.     Coordination: He displays a negative Romberg sign. Coordination abnormal.     Gait: Gait abnormal.     Deep Tendon Reflexes: Reflexes are normal and symmetric.  Psychiatric:        Mood and Affect: Mood and affect normal.        Behavior: Behavior normal.        Thought Content: Thought content normal.        Judgment: Judgment normal.   Ataxic Cane  Lab Results  Component Value Date   WBC 6.5 08/20/2018   HGB 12.5 (L) 08/20/2018   HCT 36.1 (L) 08/20/2018   PLT 167.0 08/20/2018   GLUCOSE 95 03/24/2020   CHOL 157 08/20/2018   TRIG 79.0 08/20/2018   HDL 84.20 08/20/2018   LDLCALC 57 08/20/2018   ALT 12 03/24/2020   AST 52 (H) 03/24/2020   NA 139 03/24/2020   K 3.7 03/24/2020   CL 104 03/24/2020   CREATININE 0.82 03/24/2020   BUN 7 03/24/2020   CO2 22 03/24/2020   TSH 2.43 03/24/2020   PSA 1.76 08/20/2018   INR 0.91 09/06/2009   HGBA1C 4.6 04/03/2018    No results found.  Assessment & Plan:   There are no diagnoses linked to this encounter.   Meds ordered this encounter  Medications  . promethazine (PHENERGAN) 25 MG tablet    Sig: TAKE 1 TABLET BY MOUTH EVERY 8 HOURS AS NEEDED FOR NAUSEA FOR VOMITING OR  USE  FOR  HICCUPS    Dispense:  60 tablet    Refill:  3     Follow-up: No follow-ups on file.  Walker Kehr, MD

## 2020-09-24 NOTE — Assessment & Plan Note (Signed)
Risks associated with treatment noncompliance were discussed. Compliance was encouraged. Labs  

## 2020-09-24 NOTE — Assessment & Plan Note (Signed)
Off Pepcid - re-start

## 2020-09-24 NOTE — Assessment & Plan Note (Addendum)
Alcoholic hepatitis, gastritis, neuropathy -  Discussed.  He is aware of the detrimental alcohol effects on his health. Jury excuse letter Check LFTs

## 2020-09-24 NOTE — Addendum Note (Signed)
Addended by: Ebony Cargo on: 09/24/2020 11:00 AM   Modules accepted: Orders

## 2020-09-24 NOTE — Assessment & Plan Note (Signed)
Alcoholic hepatitis was discussed LFTs

## 2020-09-24 NOTE — Assessment & Plan Note (Signed)
Chronic.  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  Restart Pepcid.  Promethazine as needed.

## 2020-09-24 NOTE — Addendum Note (Signed)
Addended by: Deatra James on: 09/24/2020 11:04 AM   Modules accepted: Orders

## 2020-09-24 NOTE — Assessment & Plan Note (Signed)
BP Readings from Last 3 Encounters:  09/24/20 140/82  06/24/20 130/74  03/24/20 (!) 142/86  BP is ok NAS diet

## 2020-09-24 NOTE — Assessment & Plan Note (Signed)
Check CBC 

## 2020-09-24 NOTE — Assessment & Plan Note (Signed)
Alcoholic hepatitis, gastritis, neuropathy -  Discussed.  He is aware of the detrimental alcohol effects on his health. A need for better compliance with meds/vitamins discussed

## 2020-09-29 LAB — ZINC: Zinc: 47 ug/dL — ABNORMAL LOW (ref 60–130)

## 2020-09-29 LAB — VITAMIN B1: Vitamin B1 (Thiamine): <6 nmol/L — ABNORMAL LOW (ref 8–30)

## 2020-10-02 ENCOUNTER — Encounter: Payer: Self-pay | Admitting: Internal Medicine

## 2020-10-02 DIAGNOSIS — E519 Thiamine deficiency, unspecified: Secondary | ICD-10-CM | POA: Insufficient documentation

## 2020-10-02 HISTORY — DX: Thiamine deficiency, unspecified: E51.9

## 2020-12-23 ENCOUNTER — Encounter: Payer: Self-pay | Admitting: Internal Medicine

## 2020-12-23 ENCOUNTER — Other Ambulatory Visit: Payer: Self-pay

## 2020-12-23 ENCOUNTER — Ambulatory Visit (INDEPENDENT_AMBULATORY_CARE_PROVIDER_SITE_OTHER): Payer: Medicare HMO | Admitting: Internal Medicine

## 2020-12-23 DIAGNOSIS — E519 Thiamine deficiency, unspecified: Secondary | ICD-10-CM

## 2020-12-23 DIAGNOSIS — I10 Essential (primary) hypertension: Secondary | ICD-10-CM | POA: Diagnosis not present

## 2020-12-23 DIAGNOSIS — K7010 Alcoholic hepatitis without ascites: Secondary | ICD-10-CM | POA: Diagnosis not present

## 2020-12-23 DIAGNOSIS — E60 Dietary zinc deficiency: Secondary | ICD-10-CM

## 2020-12-23 DIAGNOSIS — R234 Changes in skin texture: Secondary | ICD-10-CM

## 2020-12-23 DIAGNOSIS — G621 Alcoholic polyneuropathy: Secondary | ICD-10-CM | POA: Diagnosis not present

## 2020-12-23 LAB — COMPREHENSIVE METABOLIC PANEL
ALT: 23 U/L (ref 0–53)
AST: 102 U/L — ABNORMAL HIGH (ref 0–37)
Albumin: 3.5 g/dL (ref 3.5–5.2)
Alkaline Phosphatase: 304 U/L — ABNORMAL HIGH (ref 39–117)
BUN: 5 mg/dL — ABNORMAL LOW (ref 6–23)
CO2: 26 mEq/L (ref 19–32)
Calcium: 8.5 mg/dL (ref 8.4–10.5)
Chloride: 104 mEq/L (ref 96–112)
Creatinine, Ser: 0.76 mg/dL (ref 0.40–1.50)
GFR: 96.25 mL/min (ref >60.00)
Glucose, Bld: 100 mg/dL — ABNORMAL HIGH (ref 70–99)
Potassium: 3.8 mEq/L (ref 3.5–5.1)
Sodium: 139 mEq/L (ref 135–145)
Total Bilirubin: 1.5 mg/dL — ABNORMAL HIGH (ref 0.2–1.2)
Total Protein: 8.5 g/dL — ABNORMAL HIGH (ref 6.0–8.3)

## 2020-12-23 LAB — CBC WITH DIFFERENTIAL/PLATELET
Basophils Absolute: 0 10*3/uL (ref 0.0–0.1)
Basophils Relative: 0.5 % (ref 0.0–3.0)
Eosinophils Absolute: 0.1 10*3/uL (ref 0.0–0.7)
Eosinophils Relative: 1.5 % (ref 0.0–5.0)
HCT: 33.7 % — ABNORMAL LOW (ref 39.0–52.0)
Hemoglobin: 11.6 g/dL — ABNORMAL LOW (ref 13.0–17.0)
Lymphocytes Relative: 14.4 % (ref 12.0–46.0)
Lymphs Abs: 1.3 10*3/uL (ref 0.7–4.0)
MCHC: 34.4 g/dL (ref 30.0–36.0)
MCV: 100.9 fl — ABNORMAL HIGH (ref 78.0–100.0)
Monocytes Absolute: 0.7 10*3/uL (ref 0.1–1.0)
Monocytes Relative: 7.5 % (ref 3.0–12.0)
Neutro Abs: 7 10*3/uL (ref 1.4–7.7)
Neutrophils Relative %: 76.1 % (ref 43.0–77.0)
Platelets: 182 10*3/uL (ref 150.0–400.0)
RBC: 3.34 Mil/uL — ABNORMAL LOW (ref 4.22–5.81)
RDW: 14.8 % (ref 11.5–15.5)
WBC: 9.2 10*3/uL (ref 4.0–10.5)

## 2020-12-23 NOTE — Patient Instructions (Signed)
Try GLYTONE exfoliating body lotion by Desmond Dike (free acid value 17.5)  Aquaphor

## 2020-12-23 NOTE — Assessment & Plan Note (Signed)
Check LFTs 

## 2020-12-23 NOTE — Assessment & Plan Note (Signed)
B LEs - due to nutritional deficiencies: Zinc, B complex, [place], Aquaphor. Better

## 2020-12-23 NOTE — Progress Notes (Signed)
Subjective:  Patient ID: Johnny Mcknight, male    DOB: 06/28/1956  Age: 65 y.o. MRN: [id number]  CC: Follow-up (3 month f/u)   HPI Marlyn Rabine presents for alcoholism, GERD, B12 def  Outpatient Medications Prior to Visit  Medication Sig Dispense Refill  . b complex vitamins tablet Take 1 tablet by mouth daily. 100 tablet 3  . Cholecalciferol (VITAMIN D3) 50 MCG (2000 UT) capsule Take 1 capsule (2,000 Units total) by mouth daily. 100 capsule 3  . Cyanocobalamin (VITAMIN B-12) 1000 MCG SUBL Place 1 tablet (1,000 mcg total) under the tongue daily. 100 tablet 3  . diclofenac Sodium (VOLTAREN) 1 % GEL Apply 2 g topically 4 (four) times daily. 100 g 3  . famotidine (PEPCID) 40 MG tablet Take 1 tablet (40 mg total) by mouth daily. 90 tablet 3  . promethazine (PHENERGAN) 25 MG tablet TAKE 1 TABLET BY MOUTH EVERY 8 HOURS AS NEEDED FOR NAUSEA FOR VOMITING OR  USE  FOR  HICCUPS 60 tablet 3  . Triamcinolone Acetonide (TRIAMCINOLONE 0.1 % CREAM : EUCERIN) CREA Apply 1 application topically 2 (two) times daily as needed for rash, itching or irritation. 1 each 3  . zinc sulfate 50 MG CAPS capsule Take 1 capsule (220 mg total) by mouth 2 (two) times daily. 180 capsule 0   No facility-administered medications prior to visit.    ROS: Review of Systems  Constitutional: Positive for fatigue. Negative for appetite change and unexpected weight change.  HENT: Negative for congestion, nosebleeds, sneezing, sore throat and trouble swallowing.   Eyes: Negative for itching and visual disturbance.  Respiratory: Negative for cough.   Cardiovascular: Negative for chest pain, palpitations and leg swelling.  Gastrointestinal: Negative for abdominal distention, blood in stool, diarrhea and nausea.  Genitourinary: Negative for frequency and hematuria.  Musculoskeletal: Negative for back pain, gait problem, joint swelling and neck pain.  Skin: Positive for color change and rash.  Neurological: Positive for weakness and  numbness. Negative for dizziness, tremors and speech difficulty.  Psychiatric/Behavioral: Negative for agitation, dysphoric mood, sleep disturbance and suicidal ideas. The patient is not nervous/anxious.     Objective:  BP 132/80 (BP Location: Left Arm)   Pulse (!) 101   Temp 98 F (36.7 C) (Oral)   Ht 6' (1.829 m)   Wt 176 lb 9.6 oz (80.1 kg)   SpO2 96%   BMI 23.95 kg/m   BP Readings from Last 3 Encounters:  12/23/20 132/80  09/24/20 140/82  06/24/20 130/74    Wt Readings from Last 3 Encounters:  12/23/20 176 lb 9.6 oz (80.1 kg)  09/24/20 172 lb 3.2 oz (78.1 kg)  06/24/20 167 lb (75.8 kg)    Physical Exam Constitutional:      General: He is not in acute distress.    Appearance: He is well-developed.     Comments: NAD  Eyes:     Conjunctiva/sclera: Conjunctivae normal.     Pupils: Pupils are equal, round, and reactive to light.  Neck:     Thyroid: No thyromegaly.     Vascular: No JVD.  Cardiovascular:     Rate and Rhythm: Normal rate and regular rhythm.     Heart sounds: Normal heart sounds. No murmur heard. No friction rub. No gallop.   Pulmonary:     Effort: Pulmonary effort is normal. No respiratory distress.     Breath sounds: Normal breath sounds. No wheezing or rales.  Chest:     Chest wall: No tenderness.  Abdominal:  General: Bowel sounds are normal. There is no distension.     Palpations: Abdomen is soft. There is no mass.     Tenderness: There is no abdominal tenderness. There is no guarding or rebound.  Musculoskeletal:        General: No tenderness. Normal range of motion.     Cervical back: Normal range of motion.  Lymphadenopathy:     Cervical: No cervical adenopathy.  Skin:    General: Skin is warm and dry.     Findings: No rash.  Neurological:     Mental Status: He is alert and oriented to person, place, and time.     Cranial Nerves: No cranial nerve deficit.     Motor: No abnormal muscle tone.     Coordination: Coordination abnormal.      Gait: Gait abnormal.     Deep Tendon Reflexes: Reflexes are normal and symmetric.  Psychiatric:        Behavior: Behavior normal.        Thought Content: Thought content normal.        Judgment: Judgment normal.   using a cane Scaly skin on LEs  Lab Results  Component Value Date   WBC 8.6 09/24/2020   HGB 12.3 (L) 09/24/2020   HCT 36.5 (L) 09/24/2020   PLT 184.0 09/24/2020   GLUCOSE 95 03/24/2020   CHOL 157 08/20/2018   TRIG 79.0 08/20/2018   HDL 84.20 08/20/2018   LDLCALC 57 08/20/2018   ALT 15 09/24/2020   AST 55 (H) 09/24/2020   NA 139 03/24/2020   K 3.7 03/24/2020   CL 104 03/24/2020   CREATININE 0.82 03/24/2020   BUN 7 03/24/2020   CO2 22 03/24/2020   TSH 2.43 03/24/2020   PSA 1.76 08/20/2018   INR 0.91 09/06/2009   HGBA1C 4.6 04/03/2018    No results found.  Assessment & Plan:    Follow-up: No follow-ups on file.  Walker Kehr, MD

## 2020-12-23 NOTE — Assessment & Plan Note (Signed)
On B complex 

## 2020-12-23 NOTE — Assessment & Plan Note (Signed)
NAS diet 

## 2020-12-23 NOTE — Assessment & Plan Note (Signed)
On PO Zinc

## 2020-12-23 NOTE — Assessment & Plan Note (Signed)
Use a cane 

## 2020-12-28 ENCOUNTER — Other Ambulatory Visit: Payer: Self-pay | Admitting: Internal Medicine

## 2020-12-28 LAB — VITAMIN B1: Vitamin B1 (Thiamine): <6 nmol/L — ABNORMAL LOW (ref 8–30)

## 2020-12-28 LAB — ZINC: Zinc: 53 ug/dL — ABNORMAL LOW (ref 60–130)

## 2020-12-28 MED ORDER — THIAMINE HCL 100 MG PO TABS: 100.0000 mg | ORAL_TABLET | Freq: Every day | ORAL | 3 refills | Status: DC

## 2020-12-28 MED ORDER — ZINC SULFATE 50 MG PO CAPS: 220.0000 mg | ORAL_CAPSULE | Freq: Two times a day (BID) | ORAL | 3 refills | Status: DC

## 2021-04-26 ENCOUNTER — Encounter: Payer: Self-pay | Admitting: Internal Medicine

## 2021-04-26 ENCOUNTER — Other Ambulatory Visit: Payer: Self-pay

## 2021-04-26 ENCOUNTER — Ambulatory Visit (INDEPENDENT_AMBULATORY_CARE_PROVIDER_SITE_OTHER): Payer: Medicare HMO | Admitting: Internal Medicine

## 2021-04-26 ENCOUNTER — Telehealth: Payer: Self-pay

## 2021-04-26 DIAGNOSIS — G8929 Other chronic pain: Secondary | ICD-10-CM | POA: Diagnosis not present

## 2021-04-26 DIAGNOSIS — E60 Dietary zinc deficiency: Secondary | ICD-10-CM

## 2021-04-26 DIAGNOSIS — M25562 Pain in left knee: Secondary | ICD-10-CM | POA: Diagnosis not present

## 2021-04-26 DIAGNOSIS — G621 Alcoholic polyneuropathy: Secondary | ICD-10-CM

## 2021-04-26 DIAGNOSIS — M25561 Pain in right knee: Secondary | ICD-10-CM

## 2021-04-26 LAB — COMPREHENSIVE METABOLIC PANEL
ALT: 13 U/L (ref 0–53)
AST: 59 U/L — ABNORMAL HIGH (ref 0–37)
Albumin: 3.6 g/dL (ref 3.5–5.2)
Alkaline Phosphatase: 245 U/L — ABNORMAL HIGH (ref 39–117)
BUN: 13 mg/dL (ref 6–23)
CO2: 24 mEq/L (ref 19–32)
Calcium: 8.4 mg/dL (ref 8.4–10.5)
Chloride: 103 mEq/L (ref 96–112)
Creatinine, Ser: 1.29 mg/dL (ref 0.40–1.50)
GFR: 59.23 mL/min — ABNORMAL LOW (ref >60.00)
Glucose, Bld: 90 mg/dL (ref 70–99)
Potassium: 3.6 mEq/L (ref 3.5–5.1)
Sodium: 141 mEq/L (ref 135–145)
Total Bilirubin: 1.1 mg/dL (ref 0.2–1.2)
Total Protein: 8.6 g/dL — ABNORMAL HIGH (ref 6.0–8.3)

## 2021-04-26 LAB — MAGNESIUM: Magnesium: 1 mg/dL — CL (ref 1.5–2.5)

## 2021-04-26 LAB — URIC ACID: Uric Acid, Serum: 7.3 mg/dL (ref 4.0–7.8)

## 2021-04-26 MED ORDER — METHYLPREDNISOLONE 4 MG PO TBPK: ORAL_TABLET | ORAL | 0 refills | Status: DC

## 2021-04-26 MED ORDER — KETOROLAC TROMETHAMINE 30 MG/ML IJ SOLN
30.0000 mg | Freq: Once | INTRAMUSCULAR | Status: AC
  Administered 2021-04-26: 30 mg via INTRAMUSCULAR

## 2021-04-26 MED ORDER — PROMETHAZINE HCL 25 MG PO TABS: ORAL_TABLET | ORAL | 3 refills | Status: DC

## 2021-04-26 NOTE — Progress Notes (Signed)
Subjective:  Patient ID: Johnny Mcknight, male    DOB: [birth year]  Age: 65 y.o. MRN: [id number]  CC: Follow-up (4 month f/u)   HPI Johnny Mcknight presents for severe R knee and R hip pain starting this am - using a cane C/o L hand cramping, HTN, neuropathy  Outpatient Medications Prior to Visit  Medication Sig Dispense Refill   b complex vitamins tablet Take 1 tablet by mouth daily. 100 tablet 3   Cholecalciferol (VITAMIN D3) 50 MCG (2000 UT) capsule Take 1 capsule (2,000 Units total) by mouth daily. 100 capsule 3   Cyanocobalamin (VITAMIN B-12) 1000 MCG SUBL Place 1 tablet (1,000 mcg total) under the tongue daily. 100 tablet 3   diclofenac Sodium (VOLTAREN) 1 % GEL Apply 2 g topically 4 (four) times daily. 100 g 3   famotidine (PEPCID) 40 MG tablet Take 1 tablet (40 mg total) by mouth daily. 90 tablet 3   promethazine (PHENERGAN) 25 MG tablet TAKE 1 TABLET BY MOUTH EVERY 8 HOURS AS NEEDED FOR NAUSEA FOR VOMITING OR  USE  FOR  HICCUPS 60 tablet 3   thiamine 100 MG tablet Take 1 tablet (100 mg total) by mouth daily. 100 tablet 3   zinc sulfate 50 MG CAPS capsule Take 1 capsule (220 mg total) by mouth 2 (two) times daily. 180 capsule 3   Triamcinolone Acetonide (TRIAMCINOLONE 0.1 % CREAM : EUCERIN) CREA Apply 1 application topically 2 (two) times daily as needed for rash, itching or irritation. (Patient not taking: Reported on 04/26/2021) 1 each 3   No facility-administered medications prior to visit.    ROS: Review of Systems  Constitutional:  Positive for fatigue. Negative for appetite change and unexpected weight change.  HENT:  Negative for congestion, nosebleeds, sneezing, sore throat and trouble swallowing.   Eyes:  Negative for itching and visual disturbance.  Respiratory:  Negative for cough.   Cardiovascular:  Negative for chest pain, palpitations and leg swelling.  Gastrointestinal:  Negative for abdominal distention, blood in stool, diarrhea and nausea.  Genitourinary:  Negative for  frequency and hematuria.  Musculoskeletal:  Positive for arthralgias and gait problem. Negative for back pain, joint swelling and neck pain.  Skin:  Negative for rash.  Neurological:  Positive for numbness. Negative for dizziness, tremors, speech difficulty and weakness.  Psychiatric/Behavioral:  Negative for agitation, dysphoric mood and sleep disturbance. The patient is not nervous/anxious.    Objective:  BP 130/70 (BP Location: Left Arm)   Pulse (!) 104   Temp 98.2 F (36.8 C) (Oral)   Ht 6' (1.829 m)   Wt 171 lb 12.8 oz (77.9 kg)   SpO2 95%   BMI 23.30 kg/m   BP Readings from Last 3 Encounters:  04/26/21 130/70  12/23/20 132/80  09/24/20 140/82    Wt Readings from Last 3 Encounters:  04/26/21 171 lb 12.8 oz (77.9 kg)  12/23/20 176 lb 9.6 oz (80.1 kg)  09/24/20 172 lb 3.2 oz (78.1 kg)    Physical Exam Constitutional:      General: He is not in acute distress.    Appearance: He is well-developed.     Comments: NAD  Eyes:     Conjunctiva/sclera: Conjunctivae normal.     Pupils: Pupils are equal, round, and reactive to light.  Neck:     Thyroid: No thyromegaly.     Vascular: No JVD.  Cardiovascular:     Rate and Rhythm: Normal rate and regular rhythm.     Heart sounds: Normal  heart sounds. No murmur heard.   No friction rub. No gallop.  Pulmonary:     Effort: Pulmonary effort is normal. No respiratory distress.     Breath sounds: Normal breath sounds. No wheezing or rales.  Chest:     Chest wall: No tenderness.  Abdominal:     General: Bowel sounds are normal. There is no distension.     Palpations: Abdomen is soft. There is no mass.     Tenderness: There is no abdominal tenderness. There is no guarding or rebound.  Musculoskeletal:        General: Tenderness present. Normal range of motion.     Cervical back: Normal range of motion.  Lymphadenopathy:     Cervical: No cervical adenopathy.  Skin:    General: Skin is warm and dry.     Findings: No rash.   Neurological:     Mental Status: He is alert and oriented to person, place, and time.     Cranial Nerves: No cranial nerve deficit.     Motor: No abnormal muscle tone.     Coordination: Coordination normal.     Gait: Gait abnormal.     Deep Tendon Reflexes: Reflexes are normal and symmetric.  Psychiatric:        Behavior: Behavior normal.        Thought Content: Thought content normal.        Judgment: Judgment normal.  R knee, R lat hip w/pain. No knee effusion Str leg elev (-) B Using a cane  Lab Results  Component Value Date   WBC 9.2 12/23/2020   HGB 11.6 (L) 12/23/2020   HCT 33.7 (L) 12/23/2020   PLT 182.0 12/23/2020   GLUCOSE 100 (H) 12/23/2020   CHOL 157 08/20/2018   TRIG 79.0 08/20/2018   HDL 84.20 08/20/2018   LDLCALC 57 08/20/2018   ALT 23 12/23/2020   AST 102 (H) 12/23/2020   NA 139 12/23/2020   K 3.8 12/23/2020   CL 104 12/23/2020   CREATININE 0.76 12/23/2020   BUN 5 (L) 12/23/2020   CO2 26 12/23/2020   TSH 2.43 03/24/2020   PSA 1.76 08/20/2018   INR 0.91 09/06/2009   HGBA1C 4.6 04/03/2018    No results found.  Assessment & Plan:     Walker Kehr, MD

## 2021-04-26 NOTE — Addendum Note (Signed)
Addended by: Earnstine Regal on: 04/26/2021 11:24 AM   Modules accepted: Orders

## 2021-04-26 NOTE — Assessment & Plan Note (Signed)
Take Zinc Risks associated with treatment noncompliance were discussed. Compliance was encouraged.

## 2021-04-26 NOTE — Assessment & Plan Note (Addendum)
R>>L acute pain - severe. R/o gout Toradol 30 mg IM Medrol pack, knee brace Check Uric acid

## 2021-04-26 NOTE — Assessment & Plan Note (Signed)
B complex, Zinc Check Nag level

## 2021-04-26 NOTE — Telephone Encounter (Signed)
CRITICAL LAB  Pt has a Magnesium level of 1.0

## 2021-04-26 NOTE — Patient Instructions (Signed)
Get a knee brace

## 2021-04-28 ENCOUNTER — Other Ambulatory Visit: Payer: Self-pay | Admitting: Internal Medicine

## 2021-04-28 MED ORDER — MAGNESIUM 500 MG PO TABS: 500.0000 mg | ORAL_TABLET | Freq: Every day | ORAL | 3 refills | Status: DC

## 2021-05-27 ENCOUNTER — Ambulatory Visit (INDEPENDENT_AMBULATORY_CARE_PROVIDER_SITE_OTHER): Payer: Medicare HMO

## 2021-05-27 DIAGNOSIS — Z0000 Encounter for general adult medical examination without abnormal findings: Secondary | ICD-10-CM | POA: Diagnosis not present

## 2021-05-27 NOTE — Patient Instructions (Signed)
Johnny Mcknight , Thank you for taking time to come for your Medicare Wellness Visit. I appreciate your ongoing commitment to your health goals. Please review the following plan we discussed and let me know if I can assist you in the future.   Screening recommendations/referrals: Colonoscopy: last done 05/15/2013; due every 10 years Recommended yearly ophthalmology/optometry visit for glaucoma screening and checkup Recommended yearly dental visit for hygiene and checkup  Vaccinations: Influenza vaccine: declined Pneumococcal vaccine: 09/24/2020 Tdap vaccine: declined Shingles vaccine: never done   Covid-19: 12/19/2019, 01/01/2020  Advanced directives: Advance directive discussed with you today. Even though you declined this today please call our office should you change your mind and we can give you the proper paperwork for you to fill out.  Conditions/risks identified: Yes; Client understands the importance of follow-up with providers by attending scheduled visits and discussed goals to eat healthier, increase physical activity, exercise the brain, socialize more, get enough sleep and make time for laughter.  Next appointment: Please schedule your next Medicare Wellness Visit with your Nurse Health Advisor in 1 year by calling [phone].  Preventive Care 40-64 Years, Male Preventive care refers to lifestyle choices and visits with your health care provider that can promote health and wellness. What does preventive care include? A yearly physical exam. This is also called an annual well check. Dental exams once or twice a year. Routine eye exams. Ask your health care provider how often you should have your eyes checked. Personal lifestyle choices, including: Daily care of your teeth and gums. Regular physical activity. Eating a healthy diet. Avoiding tobacco and drug use. Limiting alcohol use. Practicing safe sex. Taking low-dose aspirin every day starting at age 34. What happens during an  annual well check? The services and screenings done by your health care provider during your annual well check will depend on your age, overall health, lifestyle risk factors, and family history of disease. Counseling  Your health care provider may ask you questions about your: Alcohol use. Tobacco use. Drug use. Emotional well-being. Home and relationship well-being. Sexual activity. Eating habits. Work and work Statistician. Screening  You may have the following tests or measurements: Height, weight, and BMI. Blood pressure. Lipid and cholesterol levels. These may be checked every 5 years, or more frequently if you are over 37 years old. Skin check. Lung cancer screening. You may have this screening every year starting at age 34 if you have a 30-pack-year history of smoking and currently smoke or have quit within the past 15 years. Fecal occult blood test (FOBT) of the stool. You may have this test every year starting at age 39. Flexible sigmoidoscopy or colonoscopy. You may have a sigmoidoscopy every 5 years or a colonoscopy every 10 years starting at age 57. Prostate cancer screening. Recommendations will vary depending on your family history and other risks. Hepatitis C blood test. Hepatitis B blood test. Sexually transmitted disease (STD) testing. Diabetes screening. This is done by checking your blood sugar (glucose) after you have not eaten for a while (fasting). You may have this done every 1-3 years. Discuss your test results, treatment options, and if necessary, the need for more tests with your health care provider. Vaccines  Your health care provider may recommend certain vaccines, such as: Influenza vaccine. This is recommended every year. Tetanus, diphtheria, and acellular pertussis (Tdap, Td) vaccine. You may need a Td booster every 10 years. Zoster vaccine. You may need this after age 26. Pneumococcal 13-valent conjugate (PCV13) vaccine. You may  need this if you have  certain conditions and have not been vaccinated. Pneumococcal polysaccharide (PPSV23) vaccine. You may need one or two doses if you smoke cigarettes or if you have certain conditions. Talk to your health care provider about which screenings and vaccines you need and how often you need them. This information is not intended to replace advice given to you by your health care provider. Make sure you discuss any questions you have with your health care provider. Document Released: 10/02/2015 Document Revised: 05/25/2016 Document Reviewed: 07/07/2015 Elsevier Interactive Patient Education  2017 [employer] Prevention in the Home Falls can cause injuries. They can happen to people of all ages. There are many things you can do to make your home safe and to help prevent falls. What can I do on the outside of my home? Regularly fix the edges of walkways and driveways and fix any cracks. Remove anything that might make you trip as you walk through a door, such as a raised step or threshold. Trim any bushes or trees on the path to your home. Use bright outdoor lighting. Clear any walking paths of anything that might make someone trip, such as rocks or tools. Regularly check to see if handrails are loose or broken. Make sure that both sides of any steps have handrails. Any raised decks and porches should have guardrails on the edges. Have any leaves, snow, or ice cleared regularly. Use sand or salt on walking paths during winter. Clean up any spills in your garage right away. This includes oil or grease spills. What can I do in the bathroom? Use night lights. Install grab bars by the toilet and in the tub and shower. Do not use towel bars as grab bars. Use non-skid mats or decals in the tub or shower. If you need to sit down in the shower, use a plastic, non-slip stool. Keep the floor dry. Clean up any water that spills on the floor as soon as it happens. Remove soap buildup in the tub or  shower regularly. Attach bath mats securely with double-sided non-slip rug tape. Do not have throw rugs and other things on the floor that can make you trip. What can I do in the bedroom? Use night lights. Make sure that you have a light by your bed that is easy to reach. Do not use any sheets or blankets that are too big for your bed. They should not hang down onto the floor. Have a firm chair that has side arms. You can use this for support while you get dressed. Do not have throw rugs and other things on the floor that can make you trip. What can I do in the kitchen? Clean up any spills right away. Avoid walking on wet floors. Keep items that you use a lot in easy-to-reach places. If you need to reach something above you, use a strong step stool that has a grab bar. Keep electrical cords out of the way. Do not use floor polish or wax that makes floors slippery. If you must use wax, use non-skid floor wax. Do not have throw rugs and other things on the floor that can make you trip. What can I do with my stairs? Do not leave any items on the stairs. Make sure that there are handrails on both sides of the stairs and use them. Fix handrails that are broken or loose. Make sure that handrails are as long as the stairways. Check any carpeting to make sure  that it is firmly attached to the stairs. Fix any carpet that is loose or worn. Avoid having throw rugs at the top or bottom of the stairs. If you do have throw rugs, attach them to the floor with carpet tape. Make sure that you have a light switch at the top of the stairs and the bottom of the stairs. If you do not have them, ask someone to add them for you. What else can I do to help prevent falls? Wear shoes that: Do not have high heels. Have rubber bottoms. Are comfortable and fit you well. Are closed at the toe. Do not wear sandals. If you use a stepladder: Make sure that it is fully opened. Do not climb a closed stepladder. Make  sure that both sides of the stepladder are locked into place. Ask someone to hold it for you, if possible. Clearly mark and make sure that you can see: Any grab bars or handrails. First and last steps. Where the edge of each step is. Use tools that help you move around (mobility aids) if they are needed. These include: Canes. Walkers. Scooters. Crutches. Turn on the lights when you go into a dark area. Replace any light bulbs as soon as they burn out. Set up your furniture so you have a clear path. Avoid moving your furniture around. If any of your floors are uneven, fix them. If there are any pets around you, be aware of where they are. Review your medicines with your doctor. Some medicines can make you feel dizzy. This can increase your chance of falling. Ask your doctor what other things that you can do to help prevent falls. This information is not intended to replace advice given to you by your health care provider. Make sure you discuss any questions you have with your health care provider. Document Released: 07/02/2009 Document Revised: 02/11/2016 Document Reviewed: 10/10/2014 Elsevier Interactive Patient Education  2017 [employer].

## 2021-05-27 NOTE — Progress Notes (Signed)
I connected with Johnny Mcknight today by telephone and verified that I am speaking with the correct person using two identifiers. Location patient: home Location provider: work Persons participating in the virtual visit: patient, provider.   I discussed the limitations, risks, security and privacy concerns of performing an evaluation and management service by telephone and the availability of in person appointments. I also discussed with the patient that there may be a patient responsible charge related to this service. The patient expressed understanding and verbally consented to this telephonic visit.    Interactive audio and video telecommunications were attempted between this provider and patient, however failed, due to patient having technical difficulties OR patient did not have access to video capability.  We continued and completed visit with audio only.  Some vital signs may be absent or patient reported.   Time Spent with patient on telephone encounter: 30 minutes  Subjective:   Johnny Mcknight is a 65 y.o. male who presents for an Initial Medicare Annual Wellness Visit.  Review of Systems     Cardiac Risk Factors include: advanced age (>5mn, >>54women);family history of premature cardiovascular disease;sedentary lifestyle     Objective:    There were no vitals filed for this visit. There is no height or weight on file to calculate BMI.  Advanced Directives 05/27/2021 04/06/2017 09/22/2015  Does Patient Have a Medical Advance Directive? No No No  Would patient like information on creating a medical advance directive? No - Patient declined No - Patient declined No - patient declined information    Current Medications (verified) Outpatient Encounter Medications as of 05/27/2021  Medication Sig   b complex vitamins tablet Take 1 tablet by mouth daily.   Cholecalciferol (VITAMIN D3) 50 MCG (2000 UT) capsule Take 1 capsule (2,000 Units total) by mouth daily.   Cyanocobalamin (VITAMIN  B-12) 1000 MCG SUBL Place 1 tablet (1,000 mcg total) under the tongue daily.   diclofenac Sodium (VOLTAREN) 1 % GEL Apply 2 g topically 4 (four) times daily.   famotidine (PEPCID) 40 MG tablet Take 1 tablet (40 mg total) by mouth daily.   Magnesium 500 MG TABS Take 1 tablet (500 mg total) by mouth daily.   methylPREDNISolone (MEDROL DOSEPAK) 4 MG TBPK tablet As directed   promethazine (PHENERGAN) 25 MG tablet TAKE 1 TABLET BY MOUTH EVERY 8 HOURS AS NEEDED FOR NAUSEA FOR VOMITING OR  USE  FOR  HICCUPS   thiamine 100 MG tablet Take 1 tablet (100 mg total) by mouth daily.   zinc sulfate 50 MG CAPS capsule Take 1 capsule (220 mg total) by mouth 2 (two) times daily.   No facility-administered encounter medications on file as of 05/27/2021.    Allergies (verified) Doxycycline, Omeprazole-sodium bicarbonate, and Propranolol   History: History reviewed. No pertinent past medical history. Past Surgical History:  Procedure Laterality Date   TEE WITHOUT CARDIOVERSION N/A 09/29/2015   Procedure: TRANSESOPHAGEAL ECHOCARDIOGRAM (TEE);  Surgeon: MJerline Pain MD;  [facility]  Service: Cardiovascular;  Laterality: N/A;   Family History  Problem Relation Age of Onset   Hypertension Other    Heart disease Father [id number]      MI   Hypertension Father    Hypertension Mother    Diabetes Mother    Diabetes Maternal Aunt    Stroke Maternal Aunt    Diabetes Maternal Grandmother    Social History   Socioeconomic History   Marital status: Widowed    Spouse name: Not on file  Number of children: Not on file   Years of education: Not on file   Highest education level: Not on file  Occupational History   Occupation: Disabled.  Tobacco Use   Smoking status: Former    Types: Pipe   Smokeless tobacco: Never  Substance and Sexual Activity   Alcohol use: Yes    Comment: vodka 1/2 pint a day   Drug use: No   Sexual activity: Yes  Other Topics Concern   Not on file  Social History  Narrative   Lives with wife.     Social Determinants of Health   Financial Resource Strain: Low Risk    Difficulty of Paying Living Expenses: Not hard at all  Food Insecurity: No Food Insecurity   Worried About Charity fundraiser in the Last Year: Never true   [place] in the Last Year: Never true  Transportation Needs: No Transportation Needs   Lack of Transportation (Medical): No   Lack of Transportation (Non-Medical): No  Physical Activity: Inactive   Days of Exercise per Week: 0 days   Minutes of Exercise per Session: 0 min  Stress: No Stress Concern Present   Feeling of Stress : Not at all  Social Connections: Moderately Integrated   Frequency of Communication with Friends and Family: More than three times a week   Frequency of Social Gatherings with Friends and Family: More than three times a week   Attends Religious Services: 1 to 4 times per year   Active Member of [employer] or Organizations: No   Attends Archivist Meetings: 1 to 4 times per year   Marital Status: Widowed    Tobacco Counseling Counseling given: Not Answered   Clinical Intake:  Pre-visit preparation completed: Yes  Pain : No/denies pain     Nutritional Risks: None Diabetes: No  How often do you need to have someone help you when you read instructions, pamphlets, or other written materials from your doctor or pharmacy?: 1 - Never What is the last grade level you completed in school?: 1 year of college; [employer]  Diabetic? no  Interpreter Needed?: No  Information entered by :: Lisette Abu, LPN   Activities of Daily Living In your present state of health, do you have any difficulty performing the following activities: 05/27/2021 06/24/2020  Hearing? N N  Vision? N N  Difficulty concentrating or making decisions? N -  Walking or climbing stairs? N Y  Dressing or bathing? N Y  Doing errands, shopping? N N  Preparing Food and eating ? N -  Using the Toilet? N -  In the  past six months, have you accidently leaked urine? N -  Do you have problems with loss of bowel control? N -  Managing your Medications? N -  Managing your Finances? N -  Housekeeping or managing your Housekeeping? N -  Some recent data might be hidden    Patient Care Team: Plotnikov, Evie Lacks, MD as PCP - General (Internal Medicine)  Indicate any recent Medical Services you may have received from other than [facility] providers in the past year (date may be approximate).     Assessment:   This is a routine wellness examination for Elvy.  Hearing/Vision screen Hearing Screening - Comments:: Patient denied any hearing difficulty. Vision Screening - Comments:: Patient wears glasses. No current eye exam.  Dietary issues and exercise activities discussed: Current Exercise Habits: The patient does not participate in regular exercise at present, Exercise limited by:  orthopedic condition(s)   Goals Addressed   None   Depression Screen PHQ 2/9 Scores 05/27/2021 12/23/2020 06/24/2020 06/24/2019 04/03/2018 02/24/2016  PHQ - 2 Score 0 0 0 0 0 0    Fall Risk Fall Risk  05/27/2021 04/26/2021 12/23/2020 04/03/2018 02/24/2016  Falls in the past year? 0 1 1 Yes Yes  Number falls in past yr: 0 0 0 1 2 or more  Injury with Fall? 0 1 0 No No  Risk for fall due to : No Fall Risks Impaired balance/gait;Impaired mobility Impaired balance/gait - -  Risk for fall due to: Comment - - pt now carry his cane w/him - -  Follow up Falls evaluation completed - - - -    FALL RISK PREVENTION PERTAINING TO THE HOME:  Any stairs in or around the home? Yes  If so, are there any without handrails? No  Home free of loose throw rugs in walkways, pet beds, electrical cords, etc? Yes  Adequate lighting in your home to reduce risk of falls? Yes   ASSISTIVE DEVICES UTILIZED TO PREVENT FALLS:  Life alert? No  Use of a cane, walker or w/c? Yes  Grab bars in the bathroom? No  Shower chair or bench in shower? No  Elevated toilet  seat or a handicapped toilet? No   TIMED UP AND GO:  Was the test performed? No .  Length of time to ambulate 10 feet: n/a sec.   Gait steady and fast with assistive device  Cognitive Function: Normal cognitive status assessed by direct observation by this Nurse Health Advisor. No abnormalities found.          Immunizations Immunization History  Administered Date(s) Administered   PFIZER(Purple Top)SARS-COV-2 Vaccination 12/19/2019, 01/01/2020   Pneumococcal Polysaccharide-23 09/24/2020    TDAP status: Due, Education has been provided regarding the importance of this vaccine. Advised may receive this vaccine at local pharmacy or Health Dept. Aware to provide a copy of the vaccination record if obtained from local pharmacy or Health Dept. Verbalized acceptance and understanding.  Flu Vaccine status: Declined, Education has been provided regarding the importance of this vaccine but patient still declined. Advised may receive this vaccine at local pharmacy or Health Dept. Aware to provide a copy of the vaccination record if obtained from local pharmacy or Health Dept. Verbalized acceptance and understanding.  Pneumococcal vaccine status: Up to date  Covid-19 vaccine status: Completed vaccines  Qualifies for Shingles Vaccine? Yes   Zostavax completed No   Shingrix Completed?: No.    Education has been provided regarding the importance of this vaccine. Patient has been advised to call insurance company to determine out of pocket expense if they have not yet received this vaccine. Advised may also receive vaccine at local pharmacy or Health Dept. Verbalized acceptance and understanding.  Screening Tests Health Maintenance  Topic Date Due   Zoster Vaccines- Shingrix (1 of 2) Never done   COVID-19 Vaccine (3 - Booster) 06/02/2020   INFLUENZA VACCINE  04/19/2021   TETANUS/TDAP  09/24/2021 (Originally 05/07/1975)   Pneumococcal Vaccine 4-32 Years old (2 - PCV) 09/24/2021   COLONOSCOPY  (Pts 45-27yr Insurance coverage will need to be confirmed)  05/16/2023   Hepatitis C Screening  Completed   HIV Screening  Completed   HPV VACCINES  Aged Out    Health Maintenance  Health Maintenance Due  Topic Date Due   Zoster Vaccines- Shingrix (1 of 2) Never done   COVID-19 Vaccine (3 - Booster) 06/02/2020   INFLUENZA  VACCINE  04/19/2021    Colorectal cancer screening: Type of screening: Colonoscopy. Completed 05/15/2013. Repeat every 10 years  Lung Cancer Screening: (Low Dose CT Chest recommended if Age 32-80 years, 30 pack-year currently smoking OR have quit w/in 15years.) does not qualify.   Lung Cancer Screening Referral: no  Additional Screening:  Hepatitis C Screening: does qualify; Completed yes  Vision Screening: Recommended annual ophthalmology exams for early detection of glaucoma and other disorders of the eye. Is the patient up to date with their annual eye exam?  No  Who is the provider or what is the name of the office in which the patient attends annual eye exams? Patient refused eye exam referral If pt is not established with a provider, would they like to be referred to a provider to establish care? No .   Dental Screening: Recommended annual dental exams for proper oral hygiene  Community Resource Referral / Chronic Care Management: CRR required this visit?  No   CCM required this visit?  No      Plan:     I have personally reviewed and noted the following in the patient's chart:   Medical and social history Use of alcohol, tobacco or illicit drugs  Current medications and supplements including opioid prescriptions. Patient is not currently taking opioid prescriptions. Functional ability and status Nutritional status Physical activity Advanced directives List of other physicians Hospitalizations, surgeries, and ER visits in previous 12 months Vitals Screenings to include cognitive, depression, and falls Referrals and appointments  In  addition, I have reviewed and discussed with patient certain preventive protocols, quality metrics, and best practice recommendations. A written personalized care plan for preventive services as well as general preventive health recommendations were provided to patient.     Sheral Flow, LPN   [id number]   Nurse Notes:  Patient is cogitatively intact. There were no vitals filed for this visit. There is no height or weight on file to calculate BMI. Hearing Screening - Comments:: Patient denied any hearing difficulty. Vision Screening - Comments:: Patient wears glasses. No current eye exam.

## 2021-07-22 ENCOUNTER — Other Ambulatory Visit: Payer: Self-pay | Admitting: Internal Medicine

## 2021-08-03 ENCOUNTER — Other Ambulatory Visit: Payer: Self-pay

## 2021-08-03 ENCOUNTER — Encounter: Payer: Self-pay | Admitting: Internal Medicine

## 2021-08-03 ENCOUNTER — Ambulatory Visit (INDEPENDENT_AMBULATORY_CARE_PROVIDER_SITE_OTHER): Payer: Medicare HMO | Admitting: Internal Medicine

## 2021-08-03 VITALS — BP 138/72 | HR 91 | Temp 98.6°F | Ht 72.0 in | Wt 172.6 lb

## 2021-08-03 DIAGNOSIS — M25562 Pain in left knee: Secondary | ICD-10-CM

## 2021-08-03 DIAGNOSIS — R234 Changes in skin texture: Secondary | ICD-10-CM

## 2021-08-03 DIAGNOSIS — K219 Gastro-esophageal reflux disease without esophagitis: Secondary | ICD-10-CM | POA: Diagnosis not present

## 2021-08-03 DIAGNOSIS — M25561 Pain in right knee: Secondary | ICD-10-CM

## 2021-08-03 DIAGNOSIS — E60 Dietary zinc deficiency: Secondary | ICD-10-CM | POA: Diagnosis not present

## 2021-08-03 DIAGNOSIS — E519 Thiamine deficiency, unspecified: Secondary | ICD-10-CM

## 2021-08-03 DIAGNOSIS — E538 Deficiency of other specified B group vitamins: Secondary | ICD-10-CM

## 2021-08-03 DIAGNOSIS — R066 Hiccough: Secondary | ICD-10-CM

## 2021-08-03 DIAGNOSIS — F10288 Alcohol dependence with other alcohol-induced disorder: Secondary | ICD-10-CM | POA: Diagnosis not present

## 2021-08-03 DIAGNOSIS — Z91199 Patient's noncompliance with other medical treatment and regimen due to unspecified reason: Secondary | ICD-10-CM

## 2021-08-03 DIAGNOSIS — G621 Alcoholic polyneuropathy: Secondary | ICD-10-CM | POA: Diagnosis not present

## 2021-08-03 DIAGNOSIS — G8929 Other chronic pain: Secondary | ICD-10-CM

## 2021-08-03 MED ORDER — PROMETHAZINE HCL 25 MG PO TABS: ORAL_TABLET | ORAL | 2 refills | Status: DC

## 2021-08-03 MED ORDER — ZINC SULFATE 50 MG PO CAPS: 220.0000 mg | ORAL_CAPSULE | Freq: Two times a day (BID) | ORAL | 3 refills | Status: DC

## 2021-08-03 MED ORDER — MAGNESIUM 500 MG PO TABS: 500.0000 mg | ORAL_TABLET | Freq: Every day | ORAL | 3 refills | Status: DC

## 2021-08-03 MED ORDER — VITAMIN D3 50 MCG (2000 UT) PO CAPS: 2000.0000 [IU] | ORAL_CAPSULE | Freq: Every day | ORAL | 3 refills | Status: DC

## 2021-08-03 MED ORDER — B COMPLEX PLUS PO TABS: 1.0000 | ORAL_TABLET | Freq: Every day | ORAL | 3 refills | Status: DC

## 2021-08-03 MED ORDER — FAMOTIDINE 40 MG PO TABS: 40.0000 mg | ORAL_TABLET | Freq: Every day | ORAL | 3 refills | Status: DC

## 2021-08-03 NOTE — Assessment & Plan Note (Signed)
Use a knee brace

## 2021-08-03 NOTE — Assessment & Plan Note (Signed)
Re-start B complex po Risks associated with treatment noncompliance were discussed. Compliance was encouraged. [employer]

## 2021-08-03 NOTE — Progress Notes (Signed)
Subjective:  Patient ID: Johnny Mcknight, male    DOB: 1956-04-16  Age: 65 y.o. MRN: [id number]  CC: Follow-up (3 month f/u)   HPI Johnny Mcknight presents for neuropathy - not taking B complex, alcoholism, hiccups, GERD. No falls...  Outpatient Medications Prior to Visit  Medication Sig Dispense Refill   b complex vitamins tablet Take 1 tablet by mouth daily. 100 tablet 3   diclofenac Sodium (VOLTAREN) 1 % GEL Apply 2 g topically 4 (four) times daily. 100 g 3   thiamine 100 MG tablet Take 1 tablet (100 mg total) by mouth daily. 100 tablet 3   Cholecalciferol (VITAMIN D3) 50 MCG (2000 UT) capsule Take 1 capsule (2,000 Units total) by mouth daily. 100 capsule 3   Cyanocobalamin (VITAMIN B-12) 1000 MCG SUBL Place 1 tablet (1,000 mcg total) under the tongue daily. 100 tablet 3   famotidine (PEPCID) 40 MG tablet Take 1 tablet (40 mg total) by mouth daily. 90 tablet 3   Magnesium 500 MG TABS Take 1 tablet (500 mg total) by mouth daily. 100 tablet 3   methylPREDNISolone (MEDROL DOSEPAK) 4 MG TBPK tablet As directed (Patient not taking: Reported on 08/03/2021) 21 tablet 0   promethazine (PHENERGAN) 25 MG tablet TAKE 1 TABLET BY MOUTH EVERY 8 HOURS AS NEEDED FOR NAUSEA FOR VOMITING OR  USE  FOR  HICCUPS 60 tablet 0   zinc sulfate 50 MG CAPS capsule Take 1 capsule (220 mg total) by mouth 2 (two) times daily. 180 capsule 3   No facility-administered medications prior to visit.    ROS: Review of Systems  Constitutional:  Positive for fatigue. Negative for appetite change and unexpected weight change.  HENT:  Negative for congestion, nosebleeds, sneezing, sore throat and trouble swallowing.   Eyes:  Negative for itching and visual disturbance.  Respiratory:  Negative for cough.   Cardiovascular:  Negative for chest pain, palpitations and leg swelling.  Gastrointestinal:  Positive for nausea. Negative for abdominal distention, blood in stool and diarrhea.  Genitourinary:  Negative for frequency and  hematuria.  Musculoskeletal:  Positive for arthralgias and gait problem. Negative for back pain, joint swelling and neck pain.  Skin:  Negative for rash.  Neurological:  Positive for weakness and numbness. Negative for dizziness, tremors and speech difficulty.  Hematological:  Does not bruise/bleed easily.  Psychiatric/Behavioral:  Negative for agitation, dysphoric mood, sleep disturbance and suicidal ideas. The patient is not nervous/anxious.    Objective:  BP 138/72 (BP Location: Left Arm)   Pulse 91   Temp 98.6 F (37 C) (Oral)   Ht 6' (1.829 m)   Wt 172 lb 9.6 oz (78.3 kg)   SpO2 98%   BMI 23.41 kg/m   BP Readings from Last 3 Encounters:  08/03/21 138/72  04/26/21 130/70  12/23/20 132/80    Wt Readings from Last 3 Encounters:  08/03/21 172 lb 9.6 oz (78.3 kg)  04/26/21 171 lb 12.8 oz (77.9 kg)  12/23/20 176 lb 9.6 oz (80.1 kg)    Physical Exam Constitutional:      General: He is not in acute distress.    Appearance: He is well-developed.     Comments: NAD  Eyes:     Conjunctiva/sclera: Conjunctivae normal.     Pupils: Pupils are equal, round, and reactive to light.  Neck:     Thyroid: No thyromegaly.     Vascular: No JVD.  Cardiovascular:     Rate and Rhythm: Normal rate and regular rhythm.  Heart sounds: Normal heart sounds. No murmur heard.   No friction rub. No gallop.  Pulmonary:     Effort: Pulmonary effort is normal. No respiratory distress.     Breath sounds: Normal breath sounds. No wheezing or rales.  Chest:     Chest wall: No tenderness.  Abdominal:     General: Bowel sounds are normal. There is no distension.     Palpations: Abdomen is soft. There is no mass.     Tenderness: There is no abdominal tenderness. There is no guarding or rebound.  Musculoskeletal:        General: No tenderness. Normal range of motion.     Cervical back: Normal range of motion.  Lymphadenopathy:     Cervical: No cervical adenopathy.  Skin:    General: Skin is  warm and dry.     Findings: No rash.  Neurological:     Mental Status: He is alert and oriented to person, place, and time.     Cranial Nerves: No cranial nerve deficit.     Motor: Weakness present. No abnormal muscle tone.     Coordination: Coordination abnormal.     Gait: Gait abnormal.     Deep Tendon Reflexes: Reflexes are normal and symmetric.  Psychiatric:        Behavior: Behavior normal.        Thought Content: Thought content normal.        Judgment: Judgment normal.  Ataxic Using a cane Dry skin Stiff knees    A total time of 45 minutes was spent preparing to see the patient, reviewing tests, x-rays, other  records.  Also, obtaining history and performing comprehensive physical exam.  Additionally, counseling the patient regarding the above listed issues.   Finally, documenting clinical information in the health records, coordination of care, educating the patient re malnutrition, low thiamine, mag, zinc impact on his health. It is a complex case.   Lab Results  Component Value Date   WBC 9.2 12/23/2020   HGB 11.6 (L) 12/23/2020   HCT 33.7 (L) 12/23/2020   PLT 182.0 12/23/2020   GLUCOSE 90 04/26/2021   CHOL 157 08/20/2018   TRIG 79.0 08/20/2018   HDL 84.20 08/20/2018   LDLCALC 57 08/20/2018   ALT 13 04/26/2021   AST 59 (H) 04/26/2021   NA 141 04/26/2021   K 3.6 04/26/2021   CL 103 04/26/2021   CREATININE 1.29 04/26/2021   BUN 13 04/26/2021   CO2 24 04/26/2021   TSH 2.43 03/24/2020   PSA 1.76 08/20/2018   INR 0.91 09/06/2009   HGBA1C 4.6 04/03/2018    No results found.  Assessment & Plan:   Problem List Items Addressed This Visit     Alcohol dependence with other alcohol-induced disorder ([place])    Ongoing Re-start B complex po Risks associated with treatment noncompliance were discussed. Compliance was encouraged.       Relevant Orders   CBC with Differential/Platelet   B12 deficiency   Relevant Orders   Vitamin B12   GERD    Cont  w/Pepcid Risks associated with treatment noncompliance were discussed. Compliance was encouraged.       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 Compliance was encouraged. Cane      Relevant Orders   CBC with Differential/Platelet   Comprehensive metabolic panel   Magnesium   Vitamin B12   Zinc   Scaly skin     Use Aquaphor      Thiamine deficiency     On B complex - re-start      Relevant Orders   Vitamin B1   Zinc deficiency    Re-start Zinc      Relevant Orders   CBC with Differential/Platelet   Zinc      Meds ordered this encounter  Medications   promethazine (PHENERGAN) 25 MG tablet    Sig: TAKE 1 TABLET BY MOUTH EVERY 8 HOURS AS NEEDED FOR NAUSEA FOR VOMITING OR  USE  FOR  HICCUPS    Dispense:  60 tablet    Refill:  2   Cholecalciferol (VITAMIN D3) 50 MCG (2000 UT) capsule    Sig: Take 1 capsule (2,000 Units total) by mouth daily.    Dispense:  100 capsule    Refill:  3   famotidine (PEPCID) 40 MG tablet    Sig: Take 1 tablet (40 mg total) by mouth daily.    Dispense:  90 tablet    Refill:  3   zinc sulfate 50 MG CAPS capsule    Sig: Take 1 capsule (220 mg total) by mouth 2 (two) times daily.    Dispense:  180 capsule    Refill:  3   B Complex-Folic Acid (B COMPLEX PLUS) TABS    Sig: Take 1 tablet by mouth daily.    Dispense:  100 tablet    Refill:  3   Magnesium 500 MG TABS    Sig: Take 1 tablet (500 mg total) by mouth  daily.    Dispense:  100 tablet    Refill:  3      Follow-up: Return in about 3 months (around 11/03/2021) for a follow-up visit.  Walker Kehr, MD

## 2021-08-03 NOTE — Assessment & Plan Note (Signed)
Ongoing Re-start B complex po Risks associated with treatment noncompliance were discussed. Compliance was encouraged.

## 2021-08-03 NOTE — Assessment & Plan Note (Signed)
Re-start po magnesium

## 2021-08-03 NOTE — Assessment & Plan Note (Signed)
Re-start Zinc

## 2021-08-03 NOTE — Assessment & Plan Note (Signed)
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

## 2021-08-03 NOTE — Assessment & Plan Note (Signed)
Discussed.

## 2021-08-03 NOTE — Assessment & Plan Note (Signed)
On B complex - re-start

## 2021-08-03 NOTE — Assessment & Plan Note (Signed)
Cont w/Pepcid Risks associated with treatment noncompliance were discussed. Compliance was encouraged.

## 2021-08-03 NOTE — Assessment & Plan Note (Signed)
Use Aquaphor

## 2021-11-04 ENCOUNTER — Ambulatory Visit (INDEPENDENT_AMBULATORY_CARE_PROVIDER_SITE_OTHER): Payer: Medicare HMO

## 2021-11-04 ENCOUNTER — Encounter: Payer: Self-pay | Admitting: Internal Medicine

## 2021-11-04 ENCOUNTER — Other Ambulatory Visit: Payer: Self-pay

## 2021-11-04 ENCOUNTER — Ambulatory Visit (INDEPENDENT_AMBULATORY_CARE_PROVIDER_SITE_OTHER): Payer: Medicare HMO | Admitting: Internal Medicine

## 2021-11-04 ENCOUNTER — Ambulatory Visit (HOSPITAL_COMMUNITY): Payer: Medicare HMO

## 2021-11-04 VITALS — BP 138/76 | HR 100 | Temp 98.8°F | Ht 72.0 in | Wt 166.0 lb

## 2021-11-04 DIAGNOSIS — E538 Deficiency of other specified B group vitamins: Secondary | ICD-10-CM

## 2021-11-04 DIAGNOSIS — G5701 Lesion of sciatic nerve, right lower limb: Secondary | ICD-10-CM | POA: Insufficient documentation

## 2021-11-04 DIAGNOSIS — D638 Anemia in other chronic diseases classified elsewhere: Secondary | ICD-10-CM

## 2021-11-04 DIAGNOSIS — R066 Hiccough: Secondary | ICD-10-CM

## 2021-11-04 DIAGNOSIS — E60 Dietary zinc deficiency: Secondary | ICD-10-CM

## 2021-11-04 DIAGNOSIS — F10288 Alcohol dependence with other alcohol-induced disorder: Secondary | ICD-10-CM | POA: Diagnosis not present

## 2021-11-04 DIAGNOSIS — M545 Low back pain, unspecified: Secondary | ICD-10-CM | POA: Diagnosis not present

## 2021-11-04 MED ORDER — ZINC SULFATE 50 MG PO CAPS: 220.0000 mg | ORAL_CAPSULE | Freq: Two times a day (BID) | ORAL | 3 refills | Status: DC

## 2021-11-04 MED ORDER — PROMETHAZINE HCL 25 MG PO TABS: ORAL_TABLET | ORAL | 2 refills | Status: DC

## 2021-11-04 MED ORDER — KETOROLAC TROMETHAMINE 60 MG/2ML IM SOLN
60.0000 mg | Freq: Once | INTRAMUSCULAR | Status: AC
  Administered 2021-11-04: 60 mg via INTRAMUSCULAR

## 2021-11-04 MED ORDER — B COMPLEX PLUS PO TABS: 1.0000 | ORAL_TABLET | Freq: Every day | ORAL | 3 refills | Status: DC

## 2021-11-04 MED ORDER — FAMOTIDINE 40 MG PO TABS: 40.0000 mg | ORAL_TABLET | Freq: Every day | ORAL | 3 refills | Status: DC

## 2021-11-04 MED ORDER — VITAMIN D3 50 MCG (2000 UT) PO CAPS
2000.0000 [IU] | ORAL_CAPSULE | Freq: Every day | ORAL | 3 refills | Status: DC
Start: 2021-11-04 — End: 2022-02-16

## 2021-11-04 MED ORDER — METHYLPREDNISOLONE 4 MG PO TBPK: ORAL_TABLET | ORAL | 0 refills | Status: DC

## 2021-11-04 MED ORDER — MAGNESIUM 500 MG PO TABS: 500.0000 mg | ORAL_TABLET | Freq: Every day | ORAL | 3 refills | Status: DC

## 2021-11-04 MED ORDER — THIAMINE HCL 100 MG PO TABS: 100.0000 mg | ORAL_TABLET | Freq: Every day | ORAL | 3 refills | Status: DC

## 2021-11-04 NOTE — Progress Notes (Signed)
Subjective:  Patient ID: Johnny Mcknight, male    DOB: 1956-08-09  Age: 66 y.o. MRN: [id number]  CC: No chief complaint on file.   HPI Johnny Mcknight presents for R LBP irrad to the R leg - 10/10 - no falls yet Follow-up on nutritional deficiencies and alcohol use  Outpatient Medications Prior to Visit  Medication Sig Dispense Refill   diclofenac Sodium (VOLTAREN) 1 % GEL Apply 2 g topically 4 (four) times daily. 100 g 3   b complex vitamins tablet Take 1 tablet by mouth daily. 100 tablet 3   B Complex-Folic Acid (B COMPLEX PLUS) TABS Take 1 tablet by mouth daily. 100 tablet 3   Cholecalciferol (VITAMIN D3) 50 MCG (2000 UT) capsule Take 1 capsule (2,000 Units total) by mouth daily. 100 capsule 3   famotidine (PEPCID) 40 MG tablet Take 1 tablet (40 mg total) by mouth daily. 90 tablet 3   Magnesium 500 MG TABS Take 1 tablet (500 mg total) by mouth daily. 100 tablet 3   promethazine (PHENERGAN) 25 MG tablet TAKE 1 TABLET BY MOUTH EVERY 8 HOURS AS NEEDED FOR NAUSEA FOR VOMITING OR  USE  FOR  HICCUPS 60 tablet 2   thiamine 100 MG tablet Take 1 tablet (100 mg total) by mouth daily. 100 tablet 3   zinc sulfate 50 MG CAPS capsule Take 1 capsule (220 mg total) by mouth 2 (two) times daily. 180 capsule 3   No facility-administered medications prior to visit.    ROS: Review of Systems  Constitutional:  Negative for appetite change, fatigue and unexpected weight change.  HENT:  Negative for congestion, nosebleeds, sneezing, sore throat and trouble swallowing.   Eyes:  Negative for itching and visual disturbance.  Respiratory:  Negative for cough.   Cardiovascular:  Negative for chest pain, palpitations and leg swelling.  Gastrointestinal:  Negative for abdominal distention, blood in stool, diarrhea and nausea.  Genitourinary:  Negative for frequency and hematuria.  Musculoskeletal:  Positive for back pain and gait problem. Negative for joint swelling and neck pain.  Skin:  Negative for rash.   Neurological:  Positive for tremors. Negative for dizziness, speech difficulty and weakness.  Psychiatric/Behavioral:  Negative for agitation, dysphoric mood and sleep disturbance. The patient is not nervous/anxious.    Objective:  BP 138/76 (BP Location: Left Arm, Patient Position: Sitting, Cuff Size: Large)    Pulse 100    Temp 98.8 F (37.1 C) (Oral)    Ht 6' (1.829 m)    Wt 166 lb (75.3 kg)    SpO2 99%    BMI 22.51 kg/m   BP Readings from Last 3 Encounters:  11/15/21 (!) 143/82  11/04/21 138/76  08/03/21 138/72    Wt Readings from Last 3 Encounters:  11/15/21 166 lb (75.3 kg)  11/04/21 166 lb (75.3 kg)  08/03/21 172 lb 9.6 oz (78.3 kg)    Physical Exam Constitutional:      General: He is not in acute distress.    Appearance: He is well-developed.     Comments: NAD  Eyes:     Conjunctiva/sclera: Conjunctivae normal.     Pupils: Pupils are equal, round, and reactive to light.  Neck:     Thyroid: No thyromegaly.     Vascular: No JVD.  Cardiovascular:     Rate and Rhythm: Normal rate and regular rhythm.     Heart sounds: Normal heart sounds. No murmur heard.   No friction rub. No gallop.  Pulmonary:  Effort: Pulmonary effort is normal. No respiratory distress.     Breath sounds: Normal breath sounds. No wheezing or rales.  Chest:     Chest wall: No tenderness.  Abdominal:     General: Bowel sounds are normal. There is no distension.     Palpations: Abdomen is soft. There is no mass.     Tenderness: There is no abdominal tenderness. There is no guarding or rebound.  Musculoskeletal:        General: Tenderness present. Normal range of motion.     Cervical back: Normal range of motion.  Lymphadenopathy:     Cervical: No cervical adenopathy.  Skin:    General: Skin is warm and dry.     Findings: No rash.  Neurological:     Mental Status: He is alert and oriented to person, place, and time.     Cranial Nerves: No cranial nerve deficit.     Motor: No abnormal  muscle tone.     Coordination: Coordination abnormal.     Gait: Gait abnormal.     Deep Tendon Reflexes: Reflexes are normal and symmetric.  Psychiatric:        Behavior: Behavior normal.        Thought Content: Thought content normal.        Judgment: Judgment normal.  Slightly ataxic.  Mild tremor  In a w/c Str leg elev +++ on the R   Lab Results  Component Value Date   WBC 9.2 12/23/2020   HGB 11.6 (L) 12/23/2020   HCT 33.7 (L) 12/23/2020   PLT 182.0 12/23/2020   GLUCOSE 90 04/26/2021   CHOL 157 08/20/2018   TRIG 79.0 08/20/2018   HDL 84.20 08/20/2018   LDLCALC 57 08/20/2018   ALT 13 04/26/2021   AST 59 (H) 04/26/2021   NA 141 04/26/2021   K 3.6 04/26/2021   CL 103 04/26/2021   CREATININE 1.29 04/26/2021   BUN 13 04/26/2021   CO2 24 04/26/2021   TSH 2.43 03/24/2020   PSA 1.76 08/20/2018   INR 0.91 09/06/2009   HGBA1C 4.6 04/03/2018    No results found.  Assessment & Plan:   Problem List Items Addressed This Visit     Alcohol dependence with other alcohol-induced disorder ([place])    Re-start Zinc, Vitamins      Anemia of chronic disease   B12 deficiency - Primary   Hiccups    Use Promethazine prn      Sciatic nerve disease, right    New Severe pain Toradol IM 60 mg Given Medrol pack      Relevant Medications   promethazine (PHENERGAN) 25 MG tablet   Other Relevant Orders   DG Lumbar Spine 2-3 Views (Completed)   Zinc deficiency      Meds ordered this encounter  Medications   promethazine (PHENERGAN) 25 MG tablet    Sig: TAKE 1 TABLET BY MOUTH EVERY 8 HOURS AS NEEDED FOR NAUSEA FOR VOMITING OR  USE  FOR  HICCUPS    Dispense:  60 tablet    Refill:  2   B Complex-Folic Acid (B COMPLEX PLUS) TABS    Sig: Take 1 tablet by mouth daily.    Dispense:  100 tablet    Refill:  3   Cholecalciferol (VITAMIN D3) 50 MCG (2000 UT) capsule    Sig: Take 1 capsule (2,000 Units total) by mouth daily.    Dispense:  100 capsule    Refill:  3   famotidine  (PEPCID)  40 MG tablet    Sig: Take 1 tablet (40 mg total) by mouth daily.    Dispense:  90 tablet    Refill:  3   Magnesium 500 MG TABS    Sig: Take 1 tablet (500 mg total) by mouth daily.    Dispense:  100 tablet    Refill:  3   thiamine 100 MG tablet    Sig: Take 1 tablet (100 mg total) by mouth daily.    Dispense:  100 tablet    Refill:  3   zinc sulfate 50 MG CAPS capsule    Sig: Take 1 capsule (220 mg total) by mouth 2 (two) times daily.    Dispense:  180 capsule    Refill:  3   DISCONTD: methylPREDNISolone (MEDROL DOSEPAK) 4 MG TBPK tablet    Sig: As directed    Dispense:  21 tablet    Refill:  0   ketorolac (TORADOL) injection 60 mg      Follow-up: Return in about 2 weeks (around 11/18/2021) for a follow-up visit.  Walker Kehr, MD

## 2021-11-04 NOTE — Assessment & Plan Note (Signed)
Use Promethazine prn

## 2021-11-04 NOTE — Assessment & Plan Note (Signed)
New Severe pain Toradol IM 60 mg Given Medrol pack

## 2021-11-04 NOTE — Assessment & Plan Note (Signed)
Re-start Zinc, Vitamins

## 2021-11-10 ENCOUNTER — Ambulatory Visit: Payer: Medicare HMO | Admitting: Internal Medicine

## 2021-11-15 ENCOUNTER — Encounter (HOSPITAL_COMMUNITY): Payer: Self-pay

## 2021-11-15 ENCOUNTER — Emergency Department (HOSPITAL_COMMUNITY)
Admission: EM | Admit: 2021-11-15 | Discharge: 2021-11-15 | Disposition: A | Discharge status: Home / Self Care | Payer: Medicare HMO | Attending: Emergency Medicine | Admitting: Emergency Medicine

## 2021-11-15 ENCOUNTER — Other Ambulatory Visit: Payer: Self-pay

## 2021-11-15 DIAGNOSIS — M5441 Lumbago with sciatica, right side: Secondary | ICD-10-CM | POA: Diagnosis not present

## 2021-11-15 DIAGNOSIS — M545 Low back pain, unspecified: Secondary | ICD-10-CM | POA: Diagnosis present

## 2021-11-15 DIAGNOSIS — M5431 Sciatica, right side: Secondary | ICD-10-CM

## 2021-11-15 HISTORY — DX: Gastro-esophageal reflux disease without esophagitis: K21.9

## 2021-11-15 MED ORDER — OXYCODONE HCL 5 MG PO TABS
5.0000 mg | ORAL_TABLET | Freq: Once | ORAL | Status: AC
  Administered 2021-11-15: 5 mg via ORAL
  Filled 2021-11-15: qty 1

## 2021-11-15 MED ORDER — KETOROLAC TROMETHAMINE 60 MG/2ML IM SOLN
30.0000 mg | Freq: Once | INTRAMUSCULAR | Status: AC
  Administered 2021-11-15: 30 mg via INTRAMUSCULAR
  Filled 2021-11-15: qty 2

## 2021-11-15 MED ORDER — METHYLPREDNISOLONE 4 MG PO TBPK: ORAL_TABLET | ORAL | 0 refills | Status: DC

## 2021-11-15 MED ORDER — GABAPENTIN 300 MG PO CAPS: 300.0000 mg | ORAL_CAPSULE | Freq: Three times a day (TID) | ORAL | 0 refills | Status: DC

## 2021-11-15 MED ORDER — OXYCODONE HCL 5 MG PO TABS: 5.0000 mg | ORAL_TABLET | Freq: Three times a day (TID) | ORAL | 0 refills | Status: DC | PRN

## 2021-11-15 NOTE — ED Triage Notes (Signed)
Patient c/o right lower back pain that radiates down the right leg x 1 month. Patient denies any injury. Patient states he saw his PCP a week ago, but pain is worse.

## 2021-11-15 NOTE — ED Notes (Addendum)
Assisted pt to bathroom. Pt able to stand from wheelchair and walk 6 feet with no assistance.   Pt requested to sit in lobby to wait on his ride. Reports his ride is on the way.   Pt given back pocket knife by security.

## 2021-11-15 NOTE — ED Provider Notes (Signed)
[facility] DEPT Provider Note   CSN: [id number] Arrival date & time: 11/15/21  1002     History  Chief Complaint  Patient presents with   Back Pain    Johnny Mcknight is a 66 y.o. male.  The history is provided by the patient.  Back Pain Location:  Lumbar spine Quality:  Aching Radiates to:  R thigh and R foot Pain severity:  Moderate Onset quality:  Gradual Duration:  2 weeks Timing:  Intermittent Progression:  Waxing and waning Chronicity:  Recurrent Context: physical stress   Relieved by:  Nothing Worsened by:  Ambulation and palpation Associated symptoms: paresthesias   Associated symptoms: no abdominal pain, no abdominal swelling, no bladder incontinence, no bowel incontinence, no chest pain, no fever, no headaches, no leg pain, no numbness, no pelvic pain, no perianal numbness, no tingling, no weakness and no weight loss       Home Medications Prior to Admission medications   Medication Sig Start Date End Date Taking? Authorizing Provider  gabapentin (NEURONTIN) 300 MG capsule Take 1 capsule (300 mg total) by mouth 3 (three) times daily for 14 days. 11/15/21 11/29/21 Yes Sahra Converse, DO  methylPREDNISolone (MEDROL DOSEPAK) 4 MG TBPK tablet Follow package insert 11/15/21  Yes Buck Mcaffee, DO  oxyCODONE (ROXICODONE) 5 MG immediate release tablet Take 1 tablet (5 mg total) by mouth every 8 (eight) hours as needed for up to 6 doses for breakthrough pain. 11/15/21  Yes Jamila Slatten, DO  B Complex-Folic Acid (B COMPLEX PLUS) TABS Take 1 tablet by mouth daily. 11/04/21   Plotnikov, Evie Lacks, MD  Cholecalciferol (VITAMIN D3) 50 MCG (2000 UT) capsule Take 1 capsule (2,000 Units total) by mouth daily. 11/04/21   Plotnikov, Evie Lacks, MD  diclofenac Sodium (VOLTAREN) 1 % GEL Apply 2 g topically 4 (four) times daily. 06/24/20   Plotnikov, Evie Lacks, MD  famotidine (PEPCID) 40 MG tablet Take 1 tablet (40 mg total) by mouth daily. 11/04/21   Plotnikov,  Evie Lacks, MD  Magnesium 500 MG TABS Take 1 tablet (500 mg total) by mouth daily. 11/04/21   Plotnikov, Evie Lacks, MD  promethazine (PHENERGAN) 25 MG tablet TAKE 1 TABLET BY MOUTH EVERY 8 HOURS AS NEEDED FOR NAUSEA FOR VOMITING OR  USE  FOR  HICCUPS 11/04/21   Plotnikov, Evie Lacks, MD  thiamine 100 MG tablet Take 1 tablet (100 mg total) by mouth daily. 11/04/21   Plotnikov, Evie Lacks, MD  zinc sulfate 50 MG CAPS capsule Take 1 capsule (220 mg total) by mouth 2 (two) times daily. 11/04/21   Plotnikov, Evie Lacks, MD      Allergies    Doxycycline, Omeprazole-sodium bicarbonate, and Propranolol    Review of Systems   Review of Systems  Constitutional:  Negative for fever and weight loss.  Cardiovascular:  Negative for chest pain.  Gastrointestinal:  Negative for abdominal pain and bowel incontinence.  Genitourinary:  Negative for bladder incontinence and pelvic pain.  Musculoskeletal:  Positive for back pain.  Neurological:  Positive for paresthesias. Negative for tingling, weakness, numbness and headaches.   Physical Exam Updated Vital Signs BP (!) 162/105 (BP Location: Right Arm)    Pulse 80    Temp 98 F (36.7 C) (Oral)    Resp 16    Ht 6' (1.829 m)    Wt 75.3 kg    SpO2 100%    BMI 22.51 kg/m  Physical Exam Vitals and nursing note reviewed.  Constitutional:  General: He is not in acute distress.    Appearance: He is well-developed.  HENT:     Head: Normocephalic and atraumatic.     Nose: Nose normal.     Mouth/Throat:     Mouth: Mucous membranes are moist.  Eyes:     Extraocular Movements: Extraocular movements intact.     Conjunctiva/sclera: Conjunctivae normal.     Pupils: Pupils are equal, round, and reactive to light.  Cardiovascular:     Rate and Rhythm: Normal rate and regular rhythm.     Pulses: Normal pulses.     Heart sounds: Normal heart sounds. No murmur heard. Pulmonary:     Effort: Pulmonary effort is normal. No respiratory distress.     Breath sounds: Normal  breath sounds.  Abdominal:     Palpations: Abdomen is soft.     Tenderness: There is no abdominal tenderness.  Musculoskeletal:        General: Tenderness present. No swelling. Normal range of motion.     Cervical back: Neck supple.     Comments: Tenderness to paraspinal lumbar muscles on the right as well as right gluteal muscle  Skin:    General: Skin is warm and dry.     Capillary Refill: Capillary refill takes less than 2 seconds.  Neurological:     General: No focal deficit present.     Mental Status: He is alert and oriented to person, place, and time.     Cranial Nerves: No cranial nerve deficit.     Sensory: No sensory deficit.     Motor: No weakness.     Coordination: Coordination normal.     Comments: 5+ out of 5 strength throughout, normal sensation, no drift, normal finger-nose-finger, normal speech  Psychiatric:        Mood and Affect: Mood normal.    ED Results / Procedures / Treatments   Labs (all labs ordered are listed, but only abnormal results are displayed) Labs Reviewed - No data to display  EKG None  Radiology No results found.  Procedures Procedures    Medications Ordered in ED Medications  ketorolac (TORADOL) injection 30 mg (30 mg Intramuscular Given 11/15/21 1111)  oxyCODONE (Oxy IR/ROXICODONE) immediate release tablet 5 mg (5 mg Oral Given 11/15/21 1110)    ED Course/ Medical Decision Making/ A&P                           Medical Decision Making Risk Prescription drug management.   Johnny Mcknight is here with low back pain.  Unremarkable vitals.  No fever.  Just finished a course of steroids recently.  Overall low back pain with pain radiating into the right leg at times.  Pain mostly in the right gluteus muscles as well.  Tender to palpation in the paraspinal lumbar spine on the right and in the glue muscles.  He is got strong pulses in his DPs bilaterally.  Good strength and sensation in his lower extremities.  He is neurologically intact.   He denies any loss of bowel or bladder.  No fever.  Differential diagnosis likely includes muscle spasm versus sciatica versus chronic neuropathy.  I have no suspicion for epidural abscess or spinal infection or cauda equina.  Patient recently finished a course of steroids without much improvement.  Will provide him with a dose of Toradol, Roxicodone in the ED.  Will start another round of steroids but will include gabapentin and Roxicodone for breakthrough pain.  We will refer him to sports medicine and have him follow-up with his primary care doctor.  Discharged in good condition.  Understands return precautions.  No concern for peripheral arterial disease or DVT.  This chart was dictated using voice recognition software.  Despite best efforts to proofread,  errors can occur which can change the documentation meaning.         Final Clinical Impression(s) / ED Diagnoses Final diagnoses:  Sciatica of right side    Rx / DC Orders ED Discharge Orders          Ordered    methylPREDNISolone (MEDROL DOSEPAK) 4 MG TBPK tablet        11/15/21 1114    gabapentin (NEURONTIN) 300 MG capsule  3 times daily        11/15/21 1114    oxyCODONE (ROXICODONE) 5 MG immediate release tablet  Every 8 hours PRN        11/15/21 [place], [place], DO 11/15/21 1119

## 2021-11-15 NOTE — Discharge Instructions (Signed)
Overall suspect that this is muscular type pain/discomfort.  Suspect likely sciatic pain.  Take Roxicodone as needed for breakthrough pain.  This is a narcotic pain medicine which can be sedating so do not drive or do any dangerous activities while taking this medicine.  I have also started you back on a steroid.  Recommend taking 1000 mg of Tylenol every 6 hours as needed for the pain.  Also recommend taking 400 mg ibuprofen every 8 hours as needed for pain.  I have also prescribed you gabapentin to help with what I suspect might be nerve related pain as well.  Please follow-up with your primary care doctor and sports medicine doctor.

## 2022-02-16 ENCOUNTER — Emergency Department (HOSPITAL_COMMUNITY): Payer: Medicare HMO

## 2022-02-16 ENCOUNTER — Observation Stay (HOSPITAL_COMMUNITY): Payer: Medicare HMO

## 2022-02-16 ENCOUNTER — Other Ambulatory Visit: Payer: Self-pay

## 2022-02-16 ENCOUNTER — Encounter (HOSPITAL_COMMUNITY): Payer: Self-pay

## 2022-02-16 ENCOUNTER — Inpatient Hospital Stay (HOSPITAL_COMMUNITY)
Admission: EM | Admit: 2022-02-16 | Discharge: 2022-02-21 | DRG: 812 | Disposition: A | Discharge status: Home Health | Payer: Medicare HMO | Attending: Internal Medicine | Admitting: Internal Medicine

## 2022-02-16 ENCOUNTER — Observation Stay (HOSPITAL_BASED_OUTPATIENT_CLINIC_OR_DEPARTMENT_OTHER): Payer: Medicare HMO

## 2022-02-16 DIAGNOSIS — K760 Fatty (change of) liver, not elsewhere classified: Secondary | ICD-10-CM | POA: Diagnosis present

## 2022-02-16 DIAGNOSIS — F17290 Nicotine dependence, other tobacco product, uncomplicated: Secondary | ICD-10-CM | POA: Diagnosis not present

## 2022-02-16 DIAGNOSIS — R55 Syncope and collapse: Secondary | ICD-10-CM | POA: Diagnosis present

## 2022-02-16 DIAGNOSIS — D500 Iron deficiency anemia secondary to blood loss (chronic): Secondary | ICD-10-CM | POA: Diagnosis present

## 2022-02-16 DIAGNOSIS — R7989 Other specified abnormal findings of blood chemistry: Secondary | ICD-10-CM | POA: Diagnosis present

## 2022-02-16 DIAGNOSIS — F10288 Alcohol dependence with other alcohol-induced disorder: Secondary | ICD-10-CM | POA: Diagnosis present

## 2022-02-16 DIAGNOSIS — R21 Rash and other nonspecific skin eruption: Secondary | ICD-10-CM | POA: Diagnosis present

## 2022-02-16 DIAGNOSIS — K633 Ulcer of intestine: Secondary | ICD-10-CM | POA: Diagnosis not present

## 2022-02-16 DIAGNOSIS — E559 Vitamin D deficiency, unspecified: Secondary | ICD-10-CM | POA: Diagnosis present

## 2022-02-16 DIAGNOSIS — K766 Portal hypertension: Secondary | ICD-10-CM | POA: Diagnosis present

## 2022-02-16 DIAGNOSIS — R0602 Shortness of breath: Secondary | ICD-10-CM | POA: Diagnosis not present

## 2022-02-16 DIAGNOSIS — K644 Residual hemorrhoidal skin tags: Secondary | ICD-10-CM | POA: Diagnosis present

## 2022-02-16 DIAGNOSIS — E519 Thiamine deficiency, unspecified: Secondary | ICD-10-CM | POA: Diagnosis present

## 2022-02-16 DIAGNOSIS — I8510 Secondary esophageal varices without bleeding: Secondary | ICD-10-CM | POA: Diagnosis not present

## 2022-02-16 DIAGNOSIS — D509 Iron deficiency anemia, unspecified: Secondary | ICD-10-CM | POA: Diagnosis not present

## 2022-02-16 DIAGNOSIS — K7030 Alcoholic cirrhosis of liver without ascites: Secondary | ICD-10-CM | POA: Diagnosis present

## 2022-02-16 DIAGNOSIS — I8500 Esophageal varices without bleeding: Secondary | ICD-10-CM | POA: Diagnosis not present

## 2022-02-16 DIAGNOSIS — K279 Peptic ulcer, site unspecified, unspecified as acute or chronic, without hemorrhage or perforation: Secondary | ICD-10-CM | POA: Diagnosis not present

## 2022-02-16 DIAGNOSIS — K31811 Angiodysplasia of stomach and duodenum with bleeding: Secondary | ICD-10-CM | POA: Diagnosis not present

## 2022-02-16 DIAGNOSIS — G621 Alcoholic polyneuropathy: Secondary | ICD-10-CM | POA: Diagnosis present

## 2022-02-16 DIAGNOSIS — K643 Fourth degree hemorrhoids: Secondary | ICD-10-CM | POA: Diagnosis present

## 2022-02-16 DIAGNOSIS — E8809 Other disorders of plasma-protein metabolism, not elsewhere classified: Secondary | ICD-10-CM | POA: Diagnosis present

## 2022-02-16 DIAGNOSIS — Z888 Allergy status to other drugs, medicaments and biological substances status: Secondary | ICD-10-CM

## 2022-02-16 DIAGNOSIS — K2970 Gastritis, unspecified, without bleeding: Secondary | ICD-10-CM | POA: Diagnosis not present

## 2022-02-16 DIAGNOSIS — K635 Polyp of colon: Secondary | ICD-10-CM | POA: Diagnosis present

## 2022-02-16 DIAGNOSIS — Z881 Allergy status to other antibiotic agents status: Secondary | ICD-10-CM

## 2022-02-16 DIAGNOSIS — K7290 Hepatic failure, unspecified without coma: Secondary | ICD-10-CM | POA: Diagnosis not present

## 2022-02-16 DIAGNOSIS — F17210 Nicotine dependence, cigarettes, uncomplicated: Secondary | ICD-10-CM | POA: Diagnosis not present

## 2022-02-16 DIAGNOSIS — Z79899 Other long term (current) drug therapy: Secondary | ICD-10-CM | POA: Diagnosis not present

## 2022-02-16 DIAGNOSIS — K922 Gastrointestinal hemorrhage, unspecified: Secondary | ICD-10-CM | POA: Diagnosis not present

## 2022-02-16 DIAGNOSIS — K5730 Diverticulosis of large intestine without perforation or abscess without bleeding: Secondary | ICD-10-CM | POA: Diagnosis present

## 2022-02-16 DIAGNOSIS — N179 Acute kidney failure, unspecified: Secondary | ICD-10-CM | POA: Diagnosis present

## 2022-02-16 DIAGNOSIS — J449 Chronic obstructive pulmonary disease, unspecified: Secondary | ICD-10-CM | POA: Diagnosis not present

## 2022-02-16 DIAGNOSIS — K219 Gastro-esophageal reflux disease without esophagitis: Secondary | ICD-10-CM | POA: Diagnosis present

## 2022-02-16 DIAGNOSIS — M47812 Spondylosis without myelopathy or radiculopathy, cervical region: Secondary | ICD-10-CM | POA: Diagnosis not present

## 2022-02-16 DIAGNOSIS — E872 Acidosis, unspecified: Secondary | ICD-10-CM | POA: Diagnosis present

## 2022-02-16 DIAGNOSIS — I10 Essential (primary) hypertension: Secondary | ICD-10-CM | POA: Diagnosis present

## 2022-02-16 DIAGNOSIS — K8020 Calculus of gallbladder without cholecystitis without obstruction: Secondary | ICD-10-CM | POA: Diagnosis not present

## 2022-02-16 DIAGNOSIS — D122 Benign neoplasm of ascending colon: Secondary | ICD-10-CM | POA: Diagnosis not present

## 2022-02-16 DIAGNOSIS — F10239 Alcohol dependence with withdrawal, unspecified: Secondary | ICD-10-CM | POA: Diagnosis not present

## 2022-02-16 DIAGNOSIS — D539 Nutritional anemia, unspecified: Secondary | ICD-10-CM | POA: Diagnosis not present

## 2022-02-16 DIAGNOSIS — W1830XA Fall on same level, unspecified, initial encounter: Secondary | ICD-10-CM | POA: Diagnosis present

## 2022-02-16 DIAGNOSIS — Z7141 Alcohol abuse counseling and surveillance of alcoholic: Secondary | ICD-10-CM

## 2022-02-16 DIAGNOSIS — D649 Anemia, unspecified: Secondary | ICD-10-CM | POA: Diagnosis not present

## 2022-02-16 DIAGNOSIS — K5521 Angiodysplasia of colon with hemorrhage: Secondary | ICD-10-CM | POA: Diagnosis not present

## 2022-02-16 LAB — URINALYSIS, ROUTINE W REFLEX MICROSCOPIC
Bilirubin Urine: NEGATIVE
Glucose, UA: NEGATIVE mg/dL
Ketones, ur: NEGATIVE mg/dL
Leukocytes,Ua: NEGATIVE
Nitrite: NEGATIVE
Protein, ur: NEGATIVE mg/dL
Specific Gravity, Urine: 1.021 (ref 1.005–1.030)
pH: 6 (ref 5.0–8.0)

## 2022-02-16 LAB — BLOOD GAS, VENOUS
Acid-base deficit: 5.7 mmol/L — ABNORMAL HIGH (ref 0.0–2.0)
Bicarbonate: 15.6 mmol/L — ABNORMAL LOW (ref 20.0–28.0)
O2 Saturation: 68.2 %
Patient temperature: 37
pCO2, Ven: 21 mmHg — ABNORMAL LOW (ref 44–60)
pH, Ven: 7.48 — ABNORMAL HIGH (ref 7.25–7.43)
pO2, Ven: 40 mmHg (ref 32–45)

## 2022-02-16 LAB — ABO/RH: ABO/RH(D): A POS

## 2022-02-16 LAB — IRON AND TIBC
Iron: 13 ug/dL — ABNORMAL LOW (ref 45–182)
Saturation Ratios: 3 % — ABNORMAL LOW (ref 17.9–39.5)
TIBC: 448 ug/dL (ref 250–450)
UIBC: 435 ug/dL

## 2022-02-16 LAB — HEPATIC FUNCTION PANEL
ALT: 18 U/L (ref 0–44)
AST: 75 U/L — ABNORMAL HIGH (ref 15–41)
Albumin: 3 g/dL — ABNORMAL LOW (ref 3.5–5.0)
Alkaline Phosphatase: 192 U/L — ABNORMAL HIGH (ref 38–126)
Bilirubin, Direct: 0.4 mg/dL — ABNORMAL HIGH (ref 0.0–0.2)
Indirect Bilirubin: 0.7 mg/dL (ref 0.3–0.9)
Total Bilirubin: 1.1 mg/dL (ref 0.3–1.2)
Total Protein: 8.6 g/dL — ABNORMAL HIGH (ref 6.5–8.1)

## 2022-02-16 LAB — ECHOCARDIOGRAM COMPLETE
Area-P 1/2: 5.02 cm2
Calc EF: 65.5 %
Height: 72 in
S' Lateral: 2.7 cm
Single Plane A2C EF: 66.4 %
Single Plane A4C EF: 64.5 %
Weight: 2620.83 oz

## 2022-02-16 LAB — BASIC METABOLIC PANEL
Anion gap: 12 (ref 5–15)
BUN: 16 mg/dL (ref 8–23)
CO2: 17 mmol/L — ABNORMAL LOW (ref 22–32)
Calcium: 8.8 mg/dL — ABNORMAL LOW (ref 8.9–10.3)
Chloride: 113 mmol/L — ABNORMAL HIGH (ref 98–111)
Creatinine, Ser: 1.5 mg/dL — ABNORMAL HIGH (ref 0.61–1.24)
GFR, Estimated: 52 mL/min — ABNORMAL LOW (ref >60)
Glucose, Bld: 109 mg/dL — ABNORMAL HIGH (ref 70–99)
Potassium: 3.5 mmol/L (ref 3.5–5.1)
Sodium: 142 mmol/L (ref 135–145)

## 2022-02-16 LAB — CBC
HCT: 14.6 % — ABNORMAL LOW (ref 39.0–52.0)
Hemoglobin: 4.4 g/dL — CL (ref 13.0–17.0)
MCH: 25.9 pg — ABNORMAL LOW (ref 26.0–34.0)
MCHC: 30.1 g/dL (ref 30.0–36.0)
MCV: 85.9 fL (ref 80.0–100.0)
Platelets: 197 10*3/uL (ref 150–400)
RBC: 1.7 MIL/uL — ABNORMAL LOW (ref 4.22–5.81)
RDW: 16.1 % — ABNORMAL HIGH (ref 11.5–15.5)
WBC: 8.7 10*3/uL (ref 4.0–10.5)
nRBC: 0 % (ref 0.0–0.2)

## 2022-02-16 LAB — RETICULOCYTES
Immature Retic Fract: 31.6 % — ABNORMAL HIGH (ref 2.3–15.9)
RBC.: 1.67 MIL/uL — ABNORMAL LOW (ref 4.22–5.81)
Retic Count, Absolute: 31.9 10*3/uL (ref 19.0–186.0)
Retic Ct Pct: 1.9 % (ref 0.4–3.1)

## 2022-02-16 LAB — FOLATE: Folate: 4.9 ng/mL — ABNORMAL LOW (ref >5.9)

## 2022-02-16 LAB — PROTIME-INR
INR: 1.2 (ref 0.8–1.2)
Prothrombin Time: 14.8 seconds (ref 11.4–15.2)

## 2022-02-16 LAB — LACTIC ACID, PLASMA
Lactic Acid, Venous: 4.7 mmol/L (ref 0.5–1.9)
Lactic Acid, Venous: 4.1 mmol/L (ref 0.5–1.9)

## 2022-02-16 LAB — POC OCCULT BLOOD, ED: Fecal Occult Bld: NEGATIVE

## 2022-02-16 LAB — MRSA NEXT GEN BY PCR, NASAL: MRSA by PCR Next Gen: NOT DETECTED

## 2022-02-16 LAB — VITAMIN B12: Vitamin B-12: 522 pg/mL (ref 180–914)

## 2022-02-16 LAB — TROPONIN I (HIGH SENSITIVITY)
Troponin I (High Sensitivity): 18 ng/L — ABNORMAL HIGH (ref <18)
Troponin I (High Sensitivity): 17 ng/L (ref <18)

## 2022-02-16 LAB — PREPARE RBC (CROSSMATCH)

## 2022-02-16 LAB — FERRITIN: Ferritin: 8 ng/mL — ABNORMAL LOW (ref 24–336)

## 2022-02-16 LAB — MAGNESIUM: Magnesium: 1.5 mg/dL — ABNORMAL LOW (ref 1.7–2.4)

## 2022-02-16 LAB — D-DIMER, QUANTITATIVE: D-Dimer, Quant: 1.32 ug/mL-FEU — ABNORMAL HIGH (ref 0.00–0.50)

## 2022-02-16 LAB — ETHANOL: Alcohol, Ethyl (B): 104 mg/dL — ABNORMAL HIGH (ref <10)

## 2022-02-16 LAB — CBG MONITORING, ED: Glucose-Capillary: 108 mg/dL — ABNORMAL HIGH (ref 70–99)

## 2022-02-16 MED ORDER — ACETAMINOPHEN 325 MG PO TABS: 650.0000 mg | ORAL_TABLET | Freq: Four times a day (QID) | ORAL | Status: DC | PRN

## 2022-02-16 MED ORDER — SODIUM CHLORIDE 0.9% FLUSH
3.0000 mL | Freq: Two times a day (BID) | INTRAVENOUS | Status: DC
  Administered 2022-02-16 – 2022-02-21 (×11): 3 mL via INTRAVENOUS

## 2022-02-16 MED ORDER — PROCHLORPERAZINE EDISYLATE 10 MG/2ML IJ SOLN: 5.0000 mg | INTRAMUSCULAR | Status: DC | PRN

## 2022-02-16 MED ORDER — LACTATED RINGERS IV BOLUS: 1000.0000 mL | Freq: Once | INTRAVENOUS | Status: DC

## 2022-02-16 MED ORDER — THIAMINE HCL 100 MG PO TABS
100.0000 mg | ORAL_TABLET | Freq: Every day | ORAL | Status: DC
  Administered 2022-02-16 – 2022-02-21 (×6): 100 mg via ORAL
  Filled 2022-02-16 (×6): qty 1

## 2022-02-16 MED ORDER — CHLORHEXIDINE GLUCONATE CLOTH 2 % EX PADS
6.0000 | MEDICATED_PAD | Freq: Every day | CUTANEOUS | Status: DC
  Administered 2022-02-16 – 2022-02-17 (×2): 6 via TOPICAL

## 2022-02-16 MED ORDER — THIAMINE HCL 100 MG/ML IJ SOLN
100.0000 mg | Freq: Every day | INTRAMUSCULAR | Status: DC
Start: 2022-02-16 — End: 2022-02-21

## 2022-02-16 MED ORDER — ADULT MULTIVITAMIN W/MINERALS CH
1.0000 | ORAL_TABLET | Freq: Every day | ORAL | Status: DC
  Administered 2022-02-16 – 2022-02-21 (×6): 1 via ORAL
  Filled 2022-02-16 (×6): qty 1

## 2022-02-16 MED ORDER — ONDANSETRON HCL 4 MG/2ML IJ SOLN: 4.0000 mg | Freq: Four times a day (QID) | INTRAMUSCULAR | Status: DC | PRN

## 2022-02-16 MED ORDER — ONDANSETRON HCL 4 MG PO TABS: 4.0000 mg | ORAL_TABLET | Freq: Four times a day (QID) | ORAL | Status: DC | PRN

## 2022-02-16 MED ORDER — SODIUM CHLORIDE 0.9 % IV SOLN
10.0000 mL/h | Freq: Once | INTRAVENOUS | Status: AC
  Administered 2022-02-16: 10 mL/h via INTRAVENOUS

## 2022-02-16 MED ORDER — FOLIC ACID 1 MG PO TABS
1.0000 mg | ORAL_TABLET | Freq: Every day | ORAL | Status: DC
  Administered 2022-02-16 – 2022-02-21 (×6): 1 mg via ORAL
  Filled 2022-02-16 (×6): qty 1

## 2022-02-16 MED ORDER — ORAL CARE MOUTH RINSE
15.0000 mL | Freq: Two times a day (BID) | OROMUCOSAL | Status: DC
  Administered 2022-02-16 – 2022-02-21 (×11): 15 mL via OROMUCOSAL

## 2022-02-16 MED ORDER — LORAZEPAM 2 MG/ML IJ SOLN: 0.0000 mg | Freq: Three times a day (TID) | INTRAMUSCULAR | Status: AC

## 2022-02-16 MED ORDER — IOHEXOL 350 MG/ML SOLN
75.0000 mL | Freq: Once | INTRAVENOUS | Status: AC | PRN
  Administered 2022-02-16: 75 mL via INTRAVENOUS

## 2022-02-16 MED ORDER — ACETAMINOPHEN 650 MG RE SUPP: 650.0000 mg | Freq: Four times a day (QID) | RECTAL | Status: DC | PRN

## 2022-02-16 MED ORDER — PANTOPRAZOLE SODIUM 40 MG IV SOLR
40.0000 mg | Freq: Once | INTRAVENOUS | Status: AC
Start: 2022-02-16 — End: 2022-02-16
  Administered 2022-02-16: 40 mg via INTRAVENOUS
  Filled 2022-02-16: qty 10

## 2022-02-16 MED ORDER — PANTOPRAZOLE SODIUM 40 MG IV SOLR
40.0000 mg | Freq: Two times a day (BID) | INTRAVENOUS | Status: DC
  Administered 2022-02-16 – 2022-02-20 (×7): 40 mg via INTRAVENOUS
  Filled 2022-02-16 (×7): qty 10

## 2022-02-16 MED ORDER — MAGNESIUM SULFATE 2 GM/50ML IV SOLN
2.0000 g | Freq: Once | INTRAVENOUS | Status: AC
Start: 2022-02-16 — End: 2022-02-16
  Administered 2022-02-16: 2 g via INTRAVENOUS
  Filled 2022-02-16: qty 50

## 2022-02-16 MED ORDER — LORAZEPAM 2 MG/ML IJ SOLN: 1.0000 mg | INTRAMUSCULAR | Status: AC | PRN

## 2022-02-16 MED ORDER — LORAZEPAM 1 MG PO TABS: 1.0000 mg | ORAL_TABLET | ORAL | Status: AC | PRN

## 2022-02-16 MED ORDER — LORAZEPAM 2 MG/ML IJ SOLN
0.0000 mg | INTRAMUSCULAR | Status: AC
  Administered 2022-02-17: 1 mg via INTRAVENOUS
  Filled 2022-02-16: qty 1

## 2022-02-16 NOTE — ED Provider Notes (Signed)
[facility] DEPT Provider Note   CSN: [id number] Arrival date & time: 02/16/22  1020     History  Chief Complaint  Patient presents with   Loss of Consciousness    Johnny Mcknight is a 66 y.o. male.  HPI 66 year old male presents with syncope.  This is his third syncopal episode in 2 weeks.  This morning sometime in the middle the night he got up to go the bathroom and then all of a sudden passed out.  He does feel little weak right before he passes out.  Each time he has passed out he has fallen forward.  This time he struck his forehead and has some localized pain.  Some stiffness to his neck as well since the fall.  Otherwise he states he had some shortness of breath around the time of these episodes on and off.  No chest pain.  No vomiting or focal weakness.  He does drink about half a pint or more every day.   Home Medications Prior to Admission medications   Medication Sig Start Date End Date Taking? Authorizing Provider  diclofenac Sodium (VOLTAREN) 1 % GEL Apply 2 g topically 4 (four) times daily. Patient taking differently: Apply 2 g topically daily as needed (pain). 06/24/20  Yes Plotnikov, Evie Lacks, MD  famotidine (PEPCID) 40 MG tablet Take 1 tablet (40 mg total) by mouth daily. 11/04/21  Yes Plotnikov, Evie Lacks, MD  ibuprofen (ADVIL) 200 MG tablet Take 200 mg by mouth every 6 (six) hours as needed for mild pain.   Yes [provider]  promethazine (PHENERGAN) 25 MG tablet TAKE 1 TABLET BY MOUTH EVERY 8 HOURS AS NEEDED FOR NAUSEA FOR VOMITING OR  USE  FOR  HICCUPS Patient taking differently: Take 25 mg by mouth every 8 (eight) hours as needed for nausea or vomiting (hiccups). 11/04/21  Yes Plotnikov, Evie Lacks, MD  Magnesium 500 MG TABS Take 1 tablet (500 mg total) by mouth daily. Patient not taking: Reported on 02/16/2022 11/04/21   Plotnikov, Evie Lacks, MD  thiamine 100 MG tablet Take 1 tablet (100 mg total) by mouth daily. Patient not  taking: Reported on 02/16/2022 11/04/21   Plotnikov, Evie Lacks, MD  zinc sulfate 50 MG CAPS capsule Take 1 capsule (220 mg total) by mouth 2 (two) times daily. Patient not taking: Reported on 02/16/2022 11/04/21   Plotnikov, Evie Lacks, MD      Allergies    Doxycycline, Omeprazole-sodium bicarbonate, and Propranolol    Review of Systems   Review of Systems  Respiratory:  Positive for shortness of breath.   Cardiovascular:  Negative for chest pain.  Gastrointestinal:  Negative for abdominal pain.  Neurological:  Positive for syncope, weakness and headaches.   Physical Exam Updated Vital Signs BP (!) 168/86 (BP Location: Left Arm)   Pulse (!) 104   Temp 98.1 F (36.7 C) (Oral)   Resp (!) 24   SpO2 100%  Physical Exam Vitals and nursing note reviewed. Exam conducted with a chaperone present.  Constitutional:      General: He is not in acute distress.    Appearance: He is well-developed. He is not ill-appearing or diaphoretic.  HENT:     Head: Normocephalic.   Eyes:     Extraocular Movements: Extraocular movements intact.     Pupils: Pupils are equal, round, and reactive to light.  Cardiovascular:     Rate and Rhythm: Regular rhythm. Tachycardia present.     Heart sounds: Normal heart  sounds.     Comments: HR~100 Pulmonary:     Effort: Pulmonary effort is normal.     Breath sounds: Normal breath sounds.  Abdominal:     General: There is no distension.     Palpations: Abdomen is soft.     Tenderness: There is no abdominal tenderness.  Genitourinary:    Comments: Minimal stool on digital rectal exam. Non tender hemorrhoid. No overt blood or melena Musculoskeletal:     Cervical back: Muscular tenderness (mild) present.  Skin:    General: Skin is warm and dry.  Neurological:     Mental Status: He is alert.     Comments: CN 3-12 grossly intact. 5/5 strength in all 4 extremities. Grossly normal sensation. Normal finger to nose.     ED Results / Procedures / Treatments    Labs (all labs ordered are listed, but only abnormal results are displayed) Labs Reviewed  BASIC METABOLIC PANEL - Abnormal; Notable for the following components:      Result Value   Chloride 113 (*)    CO2 17 (*)    Glucose, Bld 109 (*)    Creatinine, Ser 1.50 (*)    Calcium 8.8 (*)    GFR, Estimated 52 (*)    All other components within normal limits  CBC - Abnormal; Notable for the following components:   RBC 1.70 (*)    Hemoglobin 4.4 (*)    HCT 14.6 (*)    MCH 25.9 (*)    RDW 16.1 (*)    All other components within normal limits  URINALYSIS, ROUTINE W REFLEX MICROSCOPIC - Abnormal; Notable for the following components:   Hgb urine dipstick SMALL (*)    Bacteria, UA RARE (*)    All other components within normal limits  LACTIC ACID, PLASMA - Abnormal; Notable for the following components:   Lactic Acid, Venous 4.7 (*)    All other components within normal limits  LACTIC ACID, PLASMA - Abnormal; Notable for the following components:   Lactic Acid, Venous 4.1 (*)    All other components within normal limits  ETHANOL - Abnormal; Notable for the following components:   Alcohol, Ethyl (B) 104 (*)    All other components within normal limits  HEPATIC FUNCTION PANEL - Abnormal; Notable for the following components:   Total Protein 8.6 (*)    Albumin 3.0 (*)    AST 75 (*)    Alkaline Phosphatase 192 (*)    Bilirubin, Direct 0.4 (*)    All other components within normal limits  BLOOD GAS, VENOUS - Abnormal; Notable for the following components:   pH, Ven 7.48 (*)    pCO2, Ven 21 (*)    Bicarbonate 15.6 (*)    Acid-base deficit 5.7 (*)    All other components within normal limits  D-DIMER, QUANTITATIVE - Abnormal; Notable for the following components:   D-Dimer, Quant 1.32 (*)    All other components within normal limits  MAGNESIUM - Abnormal; Notable for the following components:   Magnesium 1.5 (*)    All other components within normal limits  FOLATE - Abnormal;  Notable for the following components:   Folate 4.9 (*)    All other components within normal limits  IRON AND TIBC - Abnormal; Notable for the following components:   Iron 13 (*)    Saturation Ratios 3 (*)    All other components within normal limits  FERRITIN - Abnormal; Notable for the following components:   Ferritin 8 (*)  All other components within normal limits  RETICULOCYTES - Abnormal; Notable for the following components:   RBC. 1.67 (*)    Immature Retic Fract 31.6 (*)    All other components within normal limits  CBG MONITORING, ED - Abnormal; Notable for the following components:   Glucose-Capillary 108 (*)    All other components within normal limits  TROPONIN I (HIGH SENSITIVITY) - Abnormal; Notable for the following components:   Troponin I (High Sensitivity) 18 (*)    All other components within normal limits  MRSA NEXT GEN BY PCR, NASAL  PROTIME-INR  VITAMIN B12  HEMOGLOBIN AND HEMATOCRIT, BLOOD  HEMOGLOBIN AND HEMATOCRIT, BLOOD  POC OCCULT BLOOD, ED  TYPE AND SCREEN  PREPARE RBC (CROSSMATCH)  ABO/RH  TROPONIN I (HIGH SENSITIVITY)    EKG EKG Interpretation  Date/Time:  Wednesday Feb 16 2022 10:51:24 EDT Ventricular Rate:  101 PR Interval:  159 QRS Duration: 92 QT Interval:  388 QTC Calculation: 503 R Axis:   74 Text Interpretation: Sinus tachycardia Borderline repolarization abnormality Prolonged QT interval overall similar to 2018 Confirmed by Sherwood Gambler [phone]) on 02/16/2022 11:01:13 AM  Radiology DG Chest 2 View  Result Date: 02/16/2022 CLINICAL DATA:  Presyncope.  Shortness of breath. EXAM: CHEST - 2 VIEW COMPARISON:  11/16/2015 FINDINGS: Lateral view degraded by patient arm position. Numerous leads and wires project over the chest. Midline trachea. Normal heart size. No pleural effusion or pneumothorax. Clear lungs. IMPRESSION: No active cardiopulmonary disease. Electronically Signed   By: Abigail Miyamoto M.D.   On: 02/16/2022 11:31   CT Head  Wo Contrast  Result Date: 02/16/2022 CLINICAL DATA:  Syncope loss of consciousness EXAM: CT HEAD WITHOUT CONTRAST CT CERVICAL SPINE WITHOUT CONTRAST TECHNIQUE: Multidetector CT imaging of the head and cervical spine was performed following the standard protocol without intravenous contrast. Multiplanar CT image reconstructions of the cervical spine were also generated. RADIATION DOSE REDUCTION: This exam was performed according to the departmental dose-optimization program which includes automated exposure control, adjustment of the mA and/or kV according to patient size and/or use of iterative reconstruction technique. COMPARISON:  CT head and cervical spine 09/06/2009 FINDINGS: CT HEAD FINDINGS Brain: There is no acute intracranial hemorrhage, extra-axial fluid collection, or acute infarct. Parenchymal volume is normal for age. The ventricles are normal in size. Gray-white differentiation is preserved. There is no mass lesion.  There is no mass effect or midline shift. Vascular: No hyperdense vessel or unexpected calcification. Skull: Normal. Negative for fracture or focal lesion. Sinuses/Orbits: The imaged paranasal sinuses are clear. The globes and orbits are unremarkable. Other: None. CT CERVICAL SPINE FINDINGS Alignment: Normal. Skull base and vertebrae: Skull base alignment is normal. Vertebral body heights are preserved. There is no evidence of acute fracture. There is no suspicious osseous lesion. Soft tissues and spinal canal: No prevertebral fluid or swelling. No visible canal hematoma. 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 There is mild multilevel degenerative endplate change and facet arthropathy resulting in up to moderate to severe bilateral neural foraminal stenosis C4-C5 and C5-C6. Upper chest: The imaged lung apices are clear. Other: None. IMPRESSION: 1. No acute intracranial pathology. 2. No  acute fracture or traumatic malalignment of the cervical spine. 3. 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 Electronically Signed   By: Valetta Mole M.D.   On: 02/16/2022 11:49   CT Angio Chest PE W and/or  Wo Contrast  Result Date: 02/16/2022 CLINICAL DATA:  Positive D-dimer, clinical suspicion for pulmonary embolism EXAM: CT ANGIOGRAPHY CHEST WITH CONTRAST TECHNIQUE: Multidetector CT imaging of the chest was performed using the standard protocol during bolus administration of intravenous contrast. Multiplanar CT image reconstructions and MIPs were obtained to evaluate the vascular anatomy. RADIATION DOSE REDUCTION: This exam was performed according to the departmental dose-optimization program which includes automated exposure control, adjustment of the mA and/or kV according to patient size and/or use of iterative reconstruction technique. CONTRAST:  55m OMNIPAQUE IOHEXOL 350 MG/ML SOLN COMPARISON:  Chest radiograph done earlier today FINDINGS: Cardiovascular: There is homogeneous enhancement in thoracic aorta. Coronary artery calcifications are seen. There are no filling defects in the central pulmonary artery branches. Motion artifacts limit evaluation of small peripheral branches especially in the lower lung fields. Mediastinum/Nodes: Unremarkable. Lungs/Pleura: There is no focal pulmonary consolidation. Small linear density in the lateral aspect of right upper lobe may suggest minimal scarring or subsegmental atelectasis. There is no pleural effusion or pneumothorax. Upper Abdomen: Liver is enlarged in size. There is nodularity in the liver surface. There are possible faint low-density space-occupying lesions in the liver. This finding is not fully evaluated. Follow-up multiphasic CT or MRI should be considered. Musculoskeletal: Bony structures are unremarkable. Gynecomastia is seen in both breasts. Review of the MIP images confirms the above  findings. IMPRESSION: There is no evidence of central pulmonary artery embolism. There is no evidence of thoracic aortic dissection. Coronary artery calcifications are seen. Nodularity in the liver surface suggests possible cirrhosis. There are faint ill-defined low-density foci in the liver. Possibility of neoplastic process in the liver is not excluded. Follow-up multiphasic CT or MRI should be considered. Electronically Signed   By: PElmer PickerM.D.   On: 02/16/2022 13:14   CT Cervical Spine Wo Contrast  Result Date: 02/16/2022 CLINICAL DATA:  Syncope loss of consciousness EXAM: CT HEAD WITHOUT CONTRAST CT CERVICAL SPINE WITHOUT CONTRAST TECHNIQUE: Multidetector CT imaging of the head and cervical spine was performed following the standard protocol without intravenous contrast. Multiplanar CT image reconstructions of the cervical spine were also generated. RADIATION DOSE REDUCTION: This exam was performed according to the departmental dose-optimization program which includes automated exposure control, adjustment of the mA and/or kV according to patient size and/or use of iterative reconstruction technique. COMPARISON:  CT head and cervical spine 09/06/2009 FINDINGS: CT HEAD FINDINGS Brain: There is no acute intracranial hemorrhage, extra-axial fluid collection, or acute infarct. Parenchymal volume is normal for age. The ventricles are normal in size. Gray-white differentiation is preserved. There is no mass lesion.  There is no mass effect or midline shift. Vascular: No hyperdense vessel or unexpected calcification. Skull: Normal. Negative for fracture or focal lesion. Sinuses/Orbits: The imaged paranasal sinuses are clear. The globes and orbits are unremarkable. Other: None. CT CERVICAL SPINE FINDINGS Alignment: Normal. Skull base and vertebrae: Skull base alignment is normal. Vertebral body heights are preserved. There is no evidence of acute fracture. There is no suspicious osseous lesion. Soft  tissues and spinal canal: No prevertebral fluid or swelling. No visible canal hematoma. 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 There is mild multilevel degenerative endplate change and facet arthropathy resulting in up to moderate to severe bilateral neural foraminal stenosis C4-C5 and C5-C6. Upper chest: The imaged lung apices are clear. Other: None. IMPRESSION: 1. No acute intracranial pathology. 2. No acute fracture or traumatic  malalignment of the cervical spine. 3. 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 Electronically Signed   By: Valetta Mole M.D.   On: 02/16/2022 11:49    Procedures .Critical Care Performed by: Sherwood Gambler, MD Authorized by: Sherwood Gambler, MD   Critical care provider statement:    Critical care time (minutes):  35   Critical care time was exclusive of:  Separately billable procedures and treating other patients   Critical care was necessary to treat or prevent imminent or life-threatening deterioration of the following conditions:  Circulatory failure   Critical care was time spent personally by me on the following activities:  Development of treatment plan with patient or surrogate, discussions with consultants, evaluation of patient's response to treatment, examination of patient, ordering and review of laboratory studies, ordering and review of radiographic studies, ordering and performing treatments and interventions, pulse oximetry, re-evaluation of patient's condition and review of old charts    Medications Ordered in ED Medications  sodium chloride flush (NS) 0.9 % injection 3 mL (3 mLs Intravenous Given 02/16/22 1253)  acetaminophen (TYLENOL) tablet 650 mg (has no administration in time range)    Or  acetaminophen (TYLENOL) suppository 650 mg (has no administration in time range)  ondansetron (ZOFRAN)  tablet 4 mg (has no administration in time range)    Or  ondansetron (ZOFRAN) injection 4 mg (has no administration in time range)  prochlorperazine (COMPAZINE) injection 5 mg (has no administration in time range)  LORazepam (ATIVAN) tablet 1-4 mg (has no administration in time range)    Or  LORazepam (ATIVAN) injection 1-4 mg (has no administration in time range)  thiamine tablet 100 mg (has no administration in time range)    Or  thiamine (B-1) injection 100 mg (has no administration in time range)  folic acid (FOLVITE) tablet 1 mg (has no administration in time range)  multivitamin with minerals tablet 1 tablet (has no administration in time range)  LORazepam (ATIVAN) injection 0-4 mg (0 mg Intravenous Not Given 02/16/22 1347)    Followed by  LORazepam (ATIVAN) injection 0-4 mg (has no administration in time range)  Chlorhexidine Gluconate Cloth 2 % PADS 6 each (6 each Topical Given 02/16/22 1430)  MEDLINE mouth rinse (has no administration in time range)  0.9 %  sodium chloride infusion (10 mL/hr Intravenous New Bag/Given 02/16/22 1242)  pantoprazole (PROTONIX) injection 40 mg (40 mg Intravenous Given 02/16/22 1242)  magnesium sulfate IVPB 2 g 50 mL (0 g Intravenous Stopped 02/16/22 1345)  iohexol (OMNIPAQUE) 350 MG/ML injection 75 mL (75 mLs Intravenous Contrast Given 02/16/22 1246)    ED Course/ Medical Decision Making/ A&P                           Medical Decision Making Amount and/or Complexity of Data Reviewed External Data Reviewed: notes. Labs: ordered. Radiology: ordered and independent interpretation performed. ECG/medicine tests: ordered and independent interpretation performed.  Risk Prescription drug management. Decision regarding hospitalization.   Patient presents with syncope and a little bit of transient shortness of breath.  Minor head injury.  CT head images viewed by myself and no head bleed.  Chest x-ray images are clear with no pneumonia or edema.  I had  originally started a work-up for PE/MI with troponin and D-dimer but after I saw the hemoglobin come back at 4.4 I tried to cancel these but was unable.  However I think the anemia is what  is causing all of his symptoms.  No overt GI bleed but limited digital rectal testing as there was essentially no stool.  He will still need work-up for this.  I have sent an anemia panel.  He has a mild acute kidney injury.  His lactate also came back at 4 which would help explain some of his metabolic acidosis and I think this is more endorgan damage from the anemia rather than sepsis as he has no infectious symptoms currently.  He will be given 2 units of blood and will need close monitoring.  I have discussed case with Dr. Olevia Bowens who will admit.  His ECG does not show acute ischemia.  Dr. Olevia Bowens and I discussed the elevated D-dimer and decided together to do the CTA given his multiple episodes of syncope.  Fortunately this does not show PE.        Final Clinical Impression(s) / ED Diagnoses Final diagnoses:  Symptomatic anemia    Rx / DC Orders ED Discharge Orders     None         Sherwood Gambler, MD 02/16/22 1438

## 2022-02-16 NOTE — Progress Notes (Signed)
  Echocardiogram 2D Echocardiogram has been performed.  Johnny Mcknight 02/16/2022, 3:41 PM

## 2022-02-16 NOTE — ED Triage Notes (Signed)
Pt brought in by friend.   Pt reports having his 3rd episode of syncope some time last night/early morning. Reports LOC after getting up to go to the bathroom.   Since has had some shob.    Reports chest pain X2 weeks.   A/Ox4 Uses cane   Lives at home with 2 sisters.

## 2022-02-16 NOTE — ED Notes (Addendum)
Critical Value:   Hemoglobin 4.4   Regenia Skeeter, EDP made aware.

## 2022-02-16 NOTE — H&P (Signed)
History and Physical    Patient: Johnny Mcknight HQP:[id number] DOB: Nov 21, 1955 DOA: 02/16/2022 DOS: the patient was seen and examined on 02/16/2022 PCP: Plotnikov, Evie Lacks, MD  Patient coming from: Home  Chief Complaint:  Chief Complaint  Patient presents with   Loss of Consciousness   HPI: Johnny Mcknight is a 66 y.o. male with medical history significant of GERD, alcoholic polyneuropathy, streptococcal pneumonia, thiamine deficiency, vitamin D deficiency, zinc deficiency who is coming to the emergency department due to having a syncopal episode earlier this morning while getting up to go to the bathroom.  This is the third episode in the last 2 weeks.  After the first episode, he has been dyspneic and hypersomnolent.  He has been sleeping 12 or more hours a day.  He continues to drink a pint of liquor daily.    No abdominal pain, nausea, emesis, diarrhea, constipation, melena or hematochezia.  He various sporadically uses 1 single tablet of ibuprofen, almost once a week.  Once a month he may have AC powder.  He denied fever, chills, rhinorrhea, sore throat, wheezing or hemoptysis.  No chest pain, palpitations, diaphoresis, PND, orthopnea or pitting edema of the lower extremities.No flank pain, dysuria, frequency or hematuria.  No polyuria, polydipsia, polyphagia or blurred vision.   ED course: Initial vital signs were temperature 97.5 F, pulse 98, respiration 20, BP 137/73 mmHg O2 sat 100% on room air.  The patient received pantoprazole 40 mg IVP x1.  Lab work: CBC showed a white count of 8.7, hemoglobin 4.4 g/dL and platelets 197.  Last hemoglobin level was 11.6 g/dL in April 2022.  PT and INR were normal.  Lactic acid is 4.7 and then 4.1 mmol/L.  Iron studies with decreased iron and folate.  BMP showed sodium 142, potassium 3.5, chloride 113 and CO2 17 mmol/L with a normal anion gap.  Glucose 109, BUN 16 and creatinine 1.5 mg/dL.  LFTs with a total protein of 8.6 and albumin 3.0 g/dL.  AST 75, ALT 18  and alkaline phosphatase 192 mg/dL.  Imaging: Two-view chest radiograph with no active cardiopulmonary disease.  CT head with no acute intracranial normality.  CT C-spine with no acute finding.  CTA chest with no evidence of central pulmonary embolism.  Liver nodularity consistent with possible liver cirrhosis.  Questionable faint ill-defined lung density foci in the liver.  Multiphasic CT or MRI could be considered to rule out neoplastic process.  Please see images and full radiology report for further details.   Review of Systems: As mentioned in the history of present illness. All other systems reviewed and are negative. Past Medical History:  Diagnosis Date   GERD (gastroesophageal reflux disease)    Polyneuropathy, alcoholic ([employer]) 01/26/9356   Gabapentin prn d/c B complex po 11/22 Re-start B complex po Risks associated with treatment noncompliance were discussed. Compliance was encouraged. Cane   Streptococcal pneumonia ([place]) 09/27/2015   1/17   Thiamine deficiency 10/02/2020   Alcohol related.  On B complex Risks associated with treatment noncompliance were discussed. Compliance was encouraged.    Vitamin D deficiency 05/14/2018   2019   Zinc deficiency 04/03/2020   2021 Zinc 220 mg bid Risks associated with treatment noncompliance were discussed. Compliance was encouraged.   Past Surgical History:  Procedure Laterality Date   TEE WITHOUT CARDIOVERSION N/A 09/29/2015   Procedure: TRANSESOPHAGEAL ECHOCARDIOGRAM (TEE);  Surgeon: Jerline Pain, MD;  Location: [facility] ENDOSCOPY;  Service: Cardiovascular;  Laterality: N/A;   Social History:  reports that he  has been smoking pipe. He has never used smokeless tobacco. He reports current alcohol use. He reports that he does not use drugs.  Allergies  Allergen Reactions   Doxycycline     REACTION: sleepy   Omeprazole-Sodium Bicarbonate     REACTION: diarrhea   Propranolol     tired    Family History  Problem Relation Age of Onset   Hypertension  Other    Heart disease Father 14       MI   Hypertension Father    Hypertension Mother    Diabetes Mother    Diabetes Maternal Aunt    Stroke Maternal Aunt    Diabetes Maternal Grandmother     Prior to Admission medications   Medication Sig Start Date End Date Taking? Authorizing Provider  diclofenac Sodium (VOLTAREN) 1 % GEL Apply 2 g topically 4 (four) times daily. Patient taking differently: Apply 2 g topically daily as needed (pain). 06/24/20  Yes Plotnikov, Evie Lacks, MD  famotidine (PEPCID) 40 MG tablet Take 1 tablet (40 mg total) by mouth daily. 11/04/21  Yes Plotnikov, Evie Lacks, MD  ibuprofen (ADVIL) 200 MG tablet Take 200 mg by mouth every 6 (six) hours as needed for mild pain.   Yes [provider]  promethazine (PHENERGAN) 25 MG tablet TAKE 1 TABLET BY MOUTH EVERY 8 HOURS AS NEEDED FOR NAUSEA FOR VOMITING OR  USE  FOR  HICCUPS Patient taking differently: Take 25 mg by mouth every 8 (eight) hours as needed for nausea or vomiting (hiccups). 11/04/21  Yes Plotnikov, Evie Lacks, MD  Magnesium 500 MG TABS Take 1 tablet (500 mg total) by mouth daily. Patient not taking: Reported on 02/16/2022 11/04/21   Plotnikov, Evie Lacks, MD  thiamine 100 MG tablet Take 1 tablet (100 mg total) by mouth daily. Patient not taking: Reported on 02/16/2022 11/04/21   Plotnikov, Evie Lacks, MD  zinc sulfate 50 MG CAPS capsule Take 1 capsule (220 mg total) by mouth 2 (two) times daily. Patient not taking: Reported on 02/16/2022 11/04/21   PlotnikovEvie Lacks, MD    Physical Exam: Vitals:   02/16/22 1427 02/16/22 1454 02/16/22 1456 02/16/22 1500  BP: (!) 168/86  (!) 148/76 (!) 151/67  Pulse: (!) 104  94 96  Resp: (!) 24  (!) 22 11  Temp: 98.1 F (36.7 C)  98.5 F (36.9 C)   TempSrc: Oral  Oral   SpO2: 100%  99% 100%  Weight:  74.3 kg    Height:  6' (1.829 m)     Physical Exam Vitals reviewed.  Constitutional:      Appearance: He is normal weight. He is ill-appearing.  HENT:     Head:  Normocephalic.     Mouth/Throat:     Mouth: Mucous membranes are moist.  Eyes:     General: No scleral icterus.    Pupils: Pupils are equal, round, and reactive to light.  Neck:     Vascular: No JVD.  Cardiovascular:     Rate and Rhythm: Regular rhythm. Tachycardia present.     Heart sounds: S1 normal and S2 normal.  Pulmonary:     Effort: Pulmonary effort is normal.     Breath sounds: Normal breath sounds.  Abdominal:     General: Bowel sounds are normal. There is no distension.     Palpations: Abdomen is soft.     Tenderness: There is no abdominal tenderness. There is no guarding.  Musculoskeletal:     Cervical back: Neck  supple.     Right lower leg: No edema.     Left lower leg: No edema.  Skin:    General: Skin is dry.     Findings: Rash present.     Comments: Hyperkeratotic rash in his ventral torso.  Neurological:     General: No focal deficit present.     Mental Status: He is alert and oriented to person, place, and time.  Psychiatric:        Mood and Affect: Mood normal.        Behavior: Behavior normal.    Data Reviewed:  Results are pending, will review when available.  Assessment and Plan: Principal Problem:   Symptomatic anemia Observation/stepdown. Transfuse PRBC x3. Monitor hematocrit and hemoglobin. Transfuse further as needed. Pantoprazole 40 mg IVP every 12 hours. Alcohol cessation strongly suggested. GI asked to consult in the morning.  Active Problems:   Alcohol dependence with other alcohol-induced disorder ([place]) Begin CIWA protocol with lorazepam. Folate, MVI and thiamine supplementation. Alcohol cessation advised. Consult transitional care team. Consider behavioral health evaluation.    GERD Continue pantoprazole.    Hypomagnesemia Supplementing. Alcohol cessation advised.    HTN (hypertension) Hold antihypertensives. Monitor blood pressure.    Positive D dimer CTA chest negative.    Hyperproteinemia Associated with    Hypoalbuminemia This is a repeating pattern. Check urinalysis. Check protein electrophoresis.    Skin rash Suspicious for fungal infection. Check skin scraping KOH test.     Advance Care Planning:   Code Status: Full Code   Consults:   Family Communication:   Severity of Illness: The appropriate patient status for this patient is OBSERVATION. Observation status is judged to be reasonable and necessary in order to provide the required intensity of service to ensure the patient's safety. The patient's presenting symptoms, physical exam findings, and initial radiographic and laboratory data in the context of their medical condition is felt to place them at decreased risk for further clinical deterioration. Furthermore, it is anticipated that the patient will be medically stable for discharge from the hospital within 2 midnights of admission.   Author: Reubin Milan, MD 02/16/2022 3:53 PM  For on call review [email or web address].   This document was prepared using Dragon voice recognition software and may contain some unintended transcription errors.

## 2022-02-17 DIAGNOSIS — G621 Alcoholic polyneuropathy: Secondary | ICD-10-CM | POA: Diagnosis present

## 2022-02-17 DIAGNOSIS — J449 Chronic obstructive pulmonary disease, unspecified: Secondary | ICD-10-CM | POA: Diagnosis not present

## 2022-02-17 DIAGNOSIS — I8500 Esophageal varices without bleeding: Secondary | ICD-10-CM | POA: Diagnosis not present

## 2022-02-17 DIAGNOSIS — K7030 Alcoholic cirrhosis of liver without ascites: Secondary | ICD-10-CM | POA: Diagnosis present

## 2022-02-17 DIAGNOSIS — D122 Benign neoplasm of ascending colon: Secondary | ICD-10-CM | POA: Diagnosis not present

## 2022-02-17 DIAGNOSIS — D500 Iron deficiency anemia secondary to blood loss (chronic): Secondary | ICD-10-CM | POA: Diagnosis present

## 2022-02-17 DIAGNOSIS — K7290 Hepatic failure, unspecified without coma: Secondary | ICD-10-CM | POA: Diagnosis not present

## 2022-02-17 DIAGNOSIS — W1830XA Fall on same level, unspecified, initial encounter: Secondary | ICD-10-CM | POA: Diagnosis present

## 2022-02-17 DIAGNOSIS — R55 Syncope and collapse: Secondary | ICD-10-CM | POA: Diagnosis present

## 2022-02-17 DIAGNOSIS — D649 Anemia, unspecified: Secondary | ICD-10-CM | POA: Diagnosis present

## 2022-02-17 DIAGNOSIS — R21 Rash and other nonspecific skin eruption: Secondary | ICD-10-CM | POA: Diagnosis present

## 2022-02-17 DIAGNOSIS — E872 Acidosis, unspecified: Secondary | ICD-10-CM | POA: Diagnosis present

## 2022-02-17 DIAGNOSIS — K8020 Calculus of gallbladder without cholecystitis without obstruction: Secondary | ICD-10-CM | POA: Diagnosis not present

## 2022-02-17 DIAGNOSIS — K2970 Gastritis, unspecified, without bleeding: Secondary | ICD-10-CM | POA: Diagnosis not present

## 2022-02-17 DIAGNOSIS — E8809 Other disorders of plasma-protein metabolism, not elsewhere classified: Secondary | ICD-10-CM | POA: Diagnosis present

## 2022-02-17 DIAGNOSIS — D509 Iron deficiency anemia, unspecified: Secondary | ICD-10-CM | POA: Diagnosis not present

## 2022-02-17 DIAGNOSIS — E559 Vitamin D deficiency, unspecified: Secondary | ICD-10-CM | POA: Diagnosis present

## 2022-02-17 DIAGNOSIS — I8510 Secondary esophageal varices without bleeding: Secondary | ICD-10-CM | POA: Diagnosis present

## 2022-02-17 DIAGNOSIS — F17210 Nicotine dependence, cigarettes, uncomplicated: Secondary | ICD-10-CM | POA: Diagnosis not present

## 2022-02-17 DIAGNOSIS — I10 Essential (primary) hypertension: Secondary | ICD-10-CM | POA: Diagnosis not present

## 2022-02-17 DIAGNOSIS — Z888 Allergy status to other drugs, medicaments and biological substances status: Secondary | ICD-10-CM | POA: Diagnosis not present

## 2022-02-17 DIAGNOSIS — K219 Gastro-esophageal reflux disease without esophagitis: Secondary | ICD-10-CM | POA: Diagnosis present

## 2022-02-17 DIAGNOSIS — Z79899 Other long term (current) drug therapy: Secondary | ICD-10-CM | POA: Diagnosis not present

## 2022-02-17 DIAGNOSIS — K279 Peptic ulcer, site unspecified, unspecified as acute or chronic, without hemorrhage or perforation: Secondary | ICD-10-CM | POA: Diagnosis not present

## 2022-02-17 DIAGNOSIS — K643 Fourth degree hemorrhoids: Secondary | ICD-10-CM | POA: Diagnosis not present

## 2022-02-17 DIAGNOSIS — F10288 Alcohol dependence with other alcohol-induced disorder: Secondary | ICD-10-CM | POA: Diagnosis not present

## 2022-02-17 DIAGNOSIS — K5730 Diverticulosis of large intestine without perforation or abscess without bleeding: Secondary | ICD-10-CM | POA: Diagnosis present

## 2022-02-17 DIAGNOSIS — K766 Portal hypertension: Secondary | ICD-10-CM | POA: Diagnosis present

## 2022-02-17 DIAGNOSIS — K644 Residual hemorrhoidal skin tags: Secondary | ICD-10-CM | POA: Diagnosis present

## 2022-02-17 DIAGNOSIS — N179 Acute kidney failure, unspecified: Secondary | ICD-10-CM | POA: Diagnosis present

## 2022-02-17 DIAGNOSIS — K922 Gastrointestinal hemorrhage, unspecified: Secondary | ICD-10-CM | POA: Diagnosis not present

## 2022-02-17 DIAGNOSIS — F10239 Alcohol dependence with withdrawal, unspecified: Secondary | ICD-10-CM | POA: Diagnosis not present

## 2022-02-17 DIAGNOSIS — K635 Polyp of colon: Secondary | ICD-10-CM | POA: Diagnosis not present

## 2022-02-17 DIAGNOSIS — E519 Thiamine deficiency, unspecified: Secondary | ICD-10-CM | POA: Diagnosis present

## 2022-02-17 LAB — CBC
HCT: 23.9 % — ABNORMAL LOW (ref 39.0–52.0)
Hemoglobin: 7.8 g/dL — ABNORMAL LOW (ref 13.0–17.0)
MCH: 27.7 pg (ref 26.0–34.0)
MCHC: 32.6 g/dL (ref 30.0–36.0)
MCV: 84.8 fL (ref 80.0–100.0)
Platelets: 164 10*3/uL (ref 150–400)
RBC: 2.82 MIL/uL — ABNORMAL LOW (ref 4.22–5.81)
RDW: 15.2 % (ref 11.5–15.5)
WBC: 10.1 10*3/uL (ref 4.0–10.5)
nRBC: 0 % (ref 0.0–0.2)

## 2022-02-17 LAB — LACTIC ACID, PLASMA: Lactic Acid, Venous: 1.2 mmol/L (ref 0.5–1.9)

## 2022-02-17 LAB — GLUCOSE, CAPILLARY: Glucose-Capillary: 110 mg/dL — ABNORMAL HIGH (ref 70–99)

## 2022-02-17 LAB — HIV ANTIBODY (ROUTINE TESTING W REFLEX): HIV Screen 4th Generation wRfx: NONREACTIVE

## 2022-02-17 LAB — COMPREHENSIVE METABOLIC PANEL
ALT: 19 U/L (ref 0–44)
AST: 75 U/L — ABNORMAL HIGH (ref 15–41)
Albumin: 2.9 g/dL — ABNORMAL LOW (ref 3.5–5.0)
Alkaline Phosphatase: 180 U/L — ABNORMAL HIGH (ref 38–126)
Anion gap: 9 (ref 5–15)
BUN: 14 mg/dL (ref 8–23)
CO2: 18 mmol/L — ABNORMAL LOW (ref 22–32)
Calcium: 8.3 mg/dL — ABNORMAL LOW (ref 8.9–10.3)
Chloride: 109 mmol/L (ref 98–111)
Creatinine, Ser: 1.37 mg/dL — ABNORMAL HIGH (ref 0.61–1.24)
GFR, Estimated: 58 mL/min — ABNORMAL LOW (ref >60)
Glucose, Bld: 108 mg/dL — ABNORMAL HIGH (ref 70–99)
Potassium: 3.6 mmol/L (ref 3.5–5.1)
Sodium: 136 mmol/L (ref 135–145)
Total Bilirubin: 3.2 mg/dL — ABNORMAL HIGH (ref 0.3–1.2)
Total Protein: 8 g/dL (ref 6.5–8.1)

## 2022-02-17 LAB — MAGNESIUM: Magnesium: 1.9 mg/dL (ref 1.7–2.4)

## 2022-02-17 MED ORDER — PEG-KCL-NACL-NASULF-NA ASC-C 100 G PO SOLR
0.5000 | Freq: Once | ORAL | Status: AC
  Administered 2022-02-17: 100 g via ORAL
  Filled 2022-02-17: qty 1

## 2022-02-17 MED ORDER — PEG-KCL-NACL-NASULF-NA ASC-C 100 G PO SOLR: 1.0000 | Freq: Once | ORAL | Status: DC

## 2022-02-17 MED ORDER — FAMOTIDINE 20 MG PO TABS: 40.0000 mg | ORAL_TABLET | Freq: Every day | ORAL | Status: DC

## 2022-02-17 NOTE — TOC Progression Note (Signed)
Transition of Care [facility]) - Progression Note    Patient Details  Name: Johnny Mcknight MRN: [id number] Date of Birth: [birth year]  Transition of Care [facility]) CM/SW Contact  Servando Snare, [place] Phone Number: 02/17/2022, 11:05 AM  Clinical Narrative:    TOC consulted for alcohol use/abuse. LCSW attempted to see patient. Patient was not in room.         Expected Discharge Plan and Services                                                 Social Determinants of Health (SDOH) Interventions    Readmission Risk Interventions     View : No data to display.

## 2022-02-17 NOTE — Consult Note (Addendum)
Consultation  Referring Provider: TRH/ Vann  Primary Care Physician:  Cassandria Anger, MD Primary Gastroenterologist: remote - Dr Sharlett Iles 2014  Reason for Consultation: Profound anemia, iron deficiency, abnormal CT imaging of liver  HPI: Johnny Mcknight is a 66 y.o. male, known remotely to Dr. Sharlett Iles, and seen in 2014, and subsequently underwent colonoscopy which revealed diverticulosis, but no colon polyps.  He has not been seen in our office since. Patient presents now to the emergency room with syncope last evening, this was his third episode in the past 2 weeks.  He relates intermittent lightheadedness and dizziness over the past couple of weeks precipitating syncope and falls. Evaluation in the emergency room with CT of the head was negative, and CT of the cervical spine negative for acute changes. He did have an elevated D-dimer and therefore CT angio of the chest was done which was negative for PE, no aortic dissection, does show an enlarged nodular liver with possible faint lesions in the liver and further imaging recommended.  Labs revealed hemoglobin of 4.4/hematocrit 14.6/MCV 85 BUN 16/creatinine 1.5 Lactate 4.7 INR 1.2 Ethanol level 104 T. bili 1.1/alk phos 192/AST 75/ALT 18 Documented Hemoccult negative Ferritin 8/serum iron 13/TIBC 447/iron sat 3  Baseline hemoglobin had been 11.61-year ago  Been transfused 3 units of packed RBCs and hemoglobin up to 7.8 this morning lactate improved to 1.2.  Patient says he feels much better after transfusions.  He denies any current chest pain or shortness of breath.  Denies any recent abdominal pain, says his appetite has been okay and his weight has been stable as far as he is aware.  He says every once in a while he will have an episode of vomiting but that has not occurred on a regular basis.  He does stay on famotidine for GERD symptoms.  No dysphagia or odynophagia. He has not been aware of any changes in his bowel habits  and has not noted any melena or hematochezia. He takes occasional Advil though not on a regular basis. No blood thinners Does drink a half a pint of alcohol per day  Family history negative for GI disease as far as he is aware.   Past Medical History:  Diagnosis Date   GERD (gastroesophageal reflux disease)    Polyneuropathy, alcoholic ([employer]) 0/09/270   Gabapentin prn d/c B complex po 11/22 Re-start B complex po Risks associated with treatment noncompliance were discussed. Compliance was encouraged. Cane   Streptococcal pneumonia ([place]) 09/27/2015   1/17   Thiamine deficiency 10/02/2020   Alcohol related.  On B complex Risks associated with treatment noncompliance were discussed. Compliance was encouraged.    Vitamin D deficiency 05/14/2018   2019   Zinc deficiency 04/03/2020   2021 Zinc 220 mg bid Risks associated with treatment noncompliance were discussed. Compliance was encouraged.    Past Surgical History:  Procedure Laterality Date   TEE WITHOUT CARDIOVERSION N/A 09/29/2015   Procedure: TRANSESOPHAGEAL ECHOCARDIOGRAM (TEE);  Surgeon: Jerline Pain, MD;  Location: [facility] ENDOSCOPY;  Service: Cardiovascular;  Laterality: N/A;    Prior to Admission medications   Medication Sig Start Date End Date Taking? Authorizing Provider  diclofenac Sodium (VOLTAREN) 1 % GEL Apply 2 g topically 4 (four) times daily. Patient taking differently: Apply 2 g topically daily as needed (pain). 06/24/20  Yes Plotnikov, Evie Lacks, MD  famotidine (PEPCID) 40 MG tablet Take 1 tablet (40 mg total) by mouth daily. 11/04/21  Yes Plotnikov, Evie Lacks, MD  ibuprofen (ADVIL)  200 MG tablet Take 200 mg by mouth every 6 (six) hours as needed for mild pain.   Yes [provider]  promethazine (PHENERGAN) 25 MG tablet TAKE 1 TABLET BY MOUTH EVERY 8 HOURS AS NEEDED FOR NAUSEA FOR VOMITING OR  USE  FOR  HICCUPS Patient taking differently: Take 25 mg by mouth every 8 (eight) hours as needed for nausea or vomiting  (hiccups). 11/04/21  Yes Plotnikov, Evie Lacks, MD  Magnesium 500 MG TABS Take 1 tablet (500 mg total) by mouth daily. Patient not taking: Reported on 02/16/2022 11/04/21   Plotnikov, Evie Lacks, MD  thiamine 100 MG tablet Take 1 tablet (100 mg total) by mouth daily. Patient not taking: Reported on 02/16/2022 11/04/21   Plotnikov, Evie Lacks, MD  zinc sulfate 50 MG CAPS capsule Take 1 capsule (220 mg total) by mouth 2 (two) times daily. Patient not taking: Reported on 02/16/2022 11/04/21   Plotnikov, Evie Lacks, MD    Current Facility-Administered Medications  Medication Dose Route Frequency Provider Last Rate Last Admin   acetaminophen (TYLENOL) tablet 650 mg  650 mg Oral Q6H PRN Reubin Milan, MD       Or   acetaminophen (TYLENOL) suppository 650 mg  650 mg Rectal Q6H PRN Reubin Milan, MD       Chlorhexidine Gluconate Cloth 2 % PADS 6 each  6 each Topical Daily Reubin Milan, MD   6 each at 63/14/97 0263   folic acid (FOLVITE) tablet 1 mg  1 mg Oral Daily Reubin Milan, MD   1 mg at 02/16/22 1533   LORazepam (ATIVAN) injection 0-4 mg  0-4 mg Intravenous Q4H Reubin Milan, MD   1 mg at 02/17/22 7858   Followed by   Derrill Memo ON 02/18/2022] LORazepam (ATIVAN) injection 0-4 mg  0-4 mg Intravenous Q8H Reubin Milan, MD       LORazepam (ATIVAN) tablet 1-4 mg  1-4 mg Oral Q1H PRN Reubin Milan, MD       Or   LORazepam (ATIVAN) injection 1-4 mg  1-4 mg Intravenous Q1H PRN Reubin Milan, MD       MEDLINE mouth rinse  15 mL Mouth Rinse BID Reubin Milan, MD   15 mL at 02/16/22 2220   multivitamin with minerals tablet 1 tablet  1 tablet Oral Daily Reubin Milan, MD   1 tablet at 02/16/22 1533   ondansetron (ZOFRAN) tablet 4 mg  4 mg Oral Q6H PRN Reubin Milan, MD       Or   ondansetron [facility]) injection 4 mg  4 mg Intravenous Q6H PRN Reubin Milan, MD       pantoprazole (PROTONIX) injection 40 mg  40 mg Intravenous Q12H Reubin Milan,  MD   40 mg at 02/16/22 2220   prochlorperazine (COMPAZINE) injection 5 mg  5 mg Intravenous Q4H PRN Reubin Milan, MD       sodium chloride flush (NS) 0.9 % injection 3 mL  3 mL Intravenous Q12H Reubin Milan, MD   3 mL at 02/16/22 2220   thiamine tablet 100 mg  100 mg Oral Daily Reubin Milan, MD   100 mg at 02/16/22 1533   Or   thiamine (B-1) injection 100 mg  100 mg Intravenous Daily Reubin Milan, MD        Allergies as of 02/16/2022 - Review Complete 02/16/2022  Allergen Reaction Noted   Doxycycline  11/19/2008   Omeprazole-sodium  bicarbonate  01/29/2008   Propranolol  06/03/2013    Family History  Problem Relation Age of Onset   Hypertension Other    Heart disease Father 46       MI   Hypertension Father    Hypertension Mother    Diabetes Mother    Diabetes Maternal Aunt    Stroke Maternal Aunt    Diabetes Maternal Grandmother     Social History   Socioeconomic History   Marital status: Widowed    Spouse name: Not on file   Number of children: Not on file   Years of education: Not on file   Highest education level: Not on file  Occupational History   Occupation: Disabled.  Tobacco Use   Smoking status: Some Days    Types: Pipe   Smokeless tobacco: Never  Vaping Use   Vaping Use: Never used  Substance and Sexual Activity   Alcohol use: Yes    Comment: vodka 1/2 pint a day   Drug use: No   Sexual activity: Yes  Other Topics Concern   Not on file  Social History Narrative   Lives with wife.     Social Determinants of Health   Financial Resource Strain: Low Risk    Difficulty of Paying Living Expenses: Not hard at all  Food Insecurity: No Food Insecurity   Worried About Charity fundraiser in the Last Year: Never true   [place] in the Last Year: Never true  Transportation Needs: No Transportation Needs   Lack of Transportation (Medical): No   Lack of Transportation (Non-Medical): No  Physical Activity: Inactive   Days  of Exercise per Week: 0 days   Minutes of Exercise per Session: 0 min  Stress: No Stress Concern Present   Feeling of Stress : Not at all  Social Connections: Moderately Integrated   Frequency of Communication with Friends and Family: More than three times a week   Frequency of Social Gatherings with Friends and Family: More than three times a week   Attends Religious Services: 1 to 4 times per year   Active Member of [employer] or Organizations: No   Attends Archivist Meetings: 1 to 4 times per year   Marital Status: Widowed  Human resources officer Violence: Not At Risk   Fear of Current or Ex-Partner: No   Emotionally Abused: No   Physically Abused: No   Sexually Abused: No    Review of Systems: Pertinent positive and negative review of systems were noted in the above HPI section.  All other review of systems was otherwise negative.   Physical Exam: Vital signs in last 24 hours: Temp:  [97.6 F (36.4 C)-100 F (37.8 C)] 98 F (36.7 C) (06/01 0816) Pulse Rate:  [81-115] 85 (06/01 0800) Resp:  [9-29] 21 (06/01 0800) BP: (129-206)/(60-91) 164/84 (06/01 0800) SpO2:  [96 %-100 %] 100 % (06/01 0800) Weight:  [66.7 kg-74.3 kg] 66.7 kg (06/01 0500) Last BM Date : 02/15/22 General:   Alert,  Well-developed, well-nourished, older African-American male pleasant and cooperative in NAD Head:  Normocephalic and atraumatic. Eyes:  Sclera clear, no icterus.   Conjunctiva pale Ears:  Normal auditory acuity. Nose:  No deformity, discharge,  or lesions. Mouth:  No deformity or lesions.   Neck:  Supple; no masses or thyromegaly. Lungs:  Clear throughout to auscultation.   No wheezes, crackles, or rhonchi.  Heart:  Regular rate and rhythm; no murmurs, clicks, rubs,  or gallops. Abdomen:  Soft,nontender, BS active, liver is palpable 2 fingerbreadths below the right costal margin, nontender, no palpable mass Rectal: Not done, documented Hemoccult negative on admission Msk:  Symmetrical without  gross deformities. . Pulses:  Normal pulses noted. Extremities:  Without clubbing or edema. Neurologic:  Alert and  oriented x4;  grossly normal neurologically. Skin:  Intact without significant lesions or rashes.. Psych:  Alert and cooperative. Normal mood and affect.  Intake/Output from previous day: 05/31 0701 - 06/01 0700 In: 1345.7 [P.O.:240; I.V.:23.5; Blood:1032; IV Piggyback:50.2] Out: 1450 [Urine:1450] Intake/Output this shift: No intake/output data recorded.  Lab Results: Recent Labs    02/16/22 1033 02/17/22 0006  WBC 8.7 10.1  HGB 4.4* 7.8*  HCT 14.6* 23.9*  PLT 197 164   BMET Recent Labs    02/16/22 1033 02/17/22 0006  NA 142 136  K 3.5 3.6  CL 113* 109  CO2 17* 18*  GLUCOSE 109* 108*  BUN 16 14  CREATININE 1.50* 1.37*  CALCIUM 8.8* 8.3*   LFT Recent Labs    02/16/22 1033 02/17/22 0006  PROT 8.6* 8.0  ALBUMIN 3.0* 2.9*  AST 75* 75*  ALT 18 19  ALKPHOS 192* 180*  BILITOT 1.1 3.2*  BILIDIR 0.4*  --   IBILI 0.7  --    PT/INR Recent Labs    02/16/22 1139  LABPROT 14.8  INR 1.2   Hepatitis Panel No results for input(s): HEPBSAG, HCVAB, HEPAIGM, HEPBIGM in the last 72 hours.    IMPRESSION:  #31 66 year old African-American male admitted with syncopal episode.  Apparently has had 3 episodes within the past couple of weeks precipitated by lightheadedness and dizziness Thus far work-up has revealed profound iron deficiency anemia with hemoglobin of 4.4 on admission Hemoccult negative CT imaging of the abdomen with CT angio shows an enlarged liver, nodular and there is question of faint low-density space-occupying lesions in the liver not fully evaluated needs multiphasic CT or MRI  Patient has not had any active GI symptoms, has not been aware of any melena or hematochezia. Colonoscopy 2014 with diverticulosis, no polyps Patient does drink alcohol on a daily basis.  Etiology of severe iron deficiency anemia is not clear, suspect secondary  to chronic GI blood loss.  Rule out occult malignancy, AVMs, chronic gastropathy,  #2 chronic EtOH abuse #3 abnormal liver on CT angio on admission, nodular surface and possible space-occupying lesions. Probable cirrhosis, rule out hepatic metastases or malignancy  #4 elevated LFTs-EtOH, versus malignancy  Plan; start clear liquid diet he will be scheduled for colonoscopy and EGD with Dr. Lyndel Safe tomorrow early afternoon.  Both procedures were discussed in detail with the patient including indications risk and benefits and he is agreeable to proceed. Bowel prep later today  IV iron this admission Transfuse to keep hemoglobin 7 Discussed MRI of the liver with the patient, he says he absolutely cannot go into an MRI machine, has tried in the past even with sedation.  Will order multiphasic CT, after endoscopic evaluation tomorrow Check CEA/AFP Thank you, GI will follow with you     Amy Esterwood PA-C 02/17/2022, 9:13 AM     Attending physician's note   I have taken history, reviewed the chart and examined the patient. I performed a substantive portion of this encounter, including complete performance of at least one of the key components, in conjunction with the APP. I agree with the Advanced Practitioner's note, impression and recommendations.   Severe symptomatic IDA with syncope without GI bleeding (Heme -ve stools). 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 Pt with continued ETOH use/abuse. AST:ALT c/w ETOH. Nl plts/Nl INR  Plan: -EGD/colon in AM. Discussed risks and benefits. -Trend CBC -Triphasic CT liver in AM(as he gets severely claustrophobic @ MRI despite sedation) -Check AFP, CEA. -Stop ETOH -If GI WU neg, hematology consult as outpt.   Carmell Austria, MD [facility] [phone]

## 2022-02-17 NOTE — Progress Notes (Signed)
PROGRESS NOTE    Johnny Mcknight  GOT:[id number] DOB: 1956-05-29 DOA: 02/16/2022 PCP: Cassandria Anger, MD    Brief Narrative:  Johnny Mcknight is a 66 y.o. male with medical history significant of GERD, alcoholic polyneuropathy, streptococcal pneumonia, thiamine deficiency, vitamin D deficiency, zinc deficiency who is coming to the emergency department due to having a syncopal episode earlier this morning while getting up to go to the bathroom.  This is the third episode in the last 2 weeks.      Assessment and Plan:  Symptomatic anemia -PRBC x3. -Monitor hematocrit and hemoglobin. -Pantoprazole 40 mg IVP every 12 hours. -Alcohol cessation strongly suggested. -GI to see     Alcohol dependence with other alcohol-induced disorder ([place]) Begin CIWA protocol with lorazepam. Folate, MVI and thiamine supplementation. Alcohol cessation advised. -TOC consult     GERD Continue pantoprazole.     Hypomagnesemia -replete     HTN (hypertension) -does not appear to be on any home meds     Positive D dimer CTA chest negative.      DVT prophylaxis: SCDs Start: 02/16/22 1231    Code Status: Full Code Family Communication:   Disposition Plan:  Level of care: Stepdown Status is: Observation The patient will require care spanning > 2 midnights and should be moved to inpatient    Consultants:  GI    Subjective: Never noticed dark stools  Objective: Vitals:   02/17/22 0800 02/17/22 0816 02/17/22 0900 02/17/22 1000  BP: (!) 164/84  (!) 152/79 (!) 167/84  Pulse: 85  83 89  Resp: (!) '21  14 19  '$ Temp:  98 F (36.7 C)    TempSrc:  Oral    SpO2: 100%  99% 100%  Weight:      Height:        Intake/Output Summary (Last 24 hours) at 02/17/2022 1135 Last data filed at 02/17/2022 0939 Gross per 24 hour  Intake 1348.72 ml  Output 1450 ml  Net -101.28 ml   Filed Weights   02/16/22 1454 02/17/22 0500  Weight: 74.3 kg 66.7 kg    Examination:   General: Appearance:    Thin male  in no acute distress     Lungs:     respirations unlabored  Heart:    Normal heart rate. Normal rhythm. No murmurs, rubs, or gallops.    MS:   All extremities are intact.    Neurologic:   Awake, alert, oriented x 3. No apparent focal neurological           defect.        Data Reviewed: I have personally reviewed following labs and imaging studies  CBC: Recent Labs  Lab 02/16/22 1033 02/17/22 0006  WBC 8.7 10.1  HGB 4.4* 7.8*  HCT 14.6* 23.9*  MCV 85.9 84.8  PLT 197 597   Basic Metabolic Panel: Recent Labs  Lab 02/16/22 1033 02/17/22 0006 02/17/22 0016  NA 142 136  --   K 3.5 3.6  --   CL 113* 109  --   CO2 17* 18*  --   GLUCOSE 109* 108*  --   BUN 16 14  --   CREATININE 1.50* 1.37*  --   CALCIUM 8.8* 8.3*  --   MG 1.5*  --  1.9   GFR: Estimated Creatinine Clearance: 52.1 mL/min (A) (by C-G formula based on SCr of 1.37 mg/dL (H)). Liver Function Tests: Recent Labs  Lab 02/16/22 1033 02/17/22 0006  AST 75* 75*  ALT 18 19  ALKPHOS 192* 180*  BILITOT 1.1 3.2*  PROT 8.6* 8.0  ALBUMIN 3.0* 2.9*   No results for input(s): LIPASE, AMYLASE in the last 168 hours. No results for input(s): AMMONIA in the last 168 hours. Coagulation Profile: Recent Labs  Lab 02/16/22 1139  INR 1.2   Cardiac Enzymes: No results for input(s): CKTOTAL, CKMB, CKMBINDEX, TROPONINI in the last 168 hours. BNP (last 3 results) No results for input(s): PROBNP in the last 8760 hours. HbA1C: No results for input(s): HGBA1C in the last 72 hours. CBG: Recent Labs  Lab 02/16/22 1029 02/17/22 0510  GLUCAP 108* 110*   Lipid Profile: No results for input(s): CHOL, HDL, LDLCALC, TRIG, CHOLHDL, LDLDIRECT in the last 72 hours. Thyroid Function Tests: No results for input(s): TSH, T4TOTAL, FREET4, T3FREE, THYROIDAB in the last 72 hours. Anemia Panel: Recent Labs    02/16/22 1033 02/16/22 1139  VITAMINB12  --  522  FOLATE  --  4.9*  FERRITIN  --  8*  TIBC  --  448  IRON  --  13*   RETICCTPCT 1.9  --    Sepsis Labs: Recent Labs  Lab 02/16/22 1139 02/16/22 1305 02/17/22 0006  LATICACIDVEN 4.7* 4.1* 1.2    Recent Results (from the past 240 hour(s))  MRSA Next Gen by PCR, Nasal     Status: None   Collection Time: 02/16/22  2:26 PM   Specimen: Nasal Mucosa; Nasal Swab  Result Value Ref Range Status   MRSA by PCR Next Gen NOT DETECTED NOT DETECTED Final    Comment: (NOTE) The GeneXpert MRSA Assay (FDA approved for NASAL specimens only), is one component of a comprehensive MRSA colonization surveillance program. It is not intended to diagnose MRSA infection nor to guide or monitor treatment for MRSA infections. Test performance is not FDA approved in patients less than 79 years old. Performed at [facility], [place] [address], [place], [place] [address]          Radiology Studies: DG Chest 2 View  Result Date: 02/16/2022 CLINICAL DATA:  Presyncope.  Shortness of breath. EXAM: CHEST - 2 VIEW COMPARISON:  11/16/2015 FINDINGS: Lateral view degraded by patient arm position. Numerous leads and wires project over the chest. Midline trachea. Normal heart size. No pleural effusion or pneumothorax. Clear lungs. IMPRESSION: No active cardiopulmonary disease. Electronically Signed   By: Abigail Miyamoto M.D.   On: 02/16/2022 11:31   CT Head Wo Contrast  Result Date: 02/16/2022 CLINICAL DATA:  Syncope loss of consciousness EXAM: CT HEAD WITHOUT CONTRAST CT CERVICAL SPINE WITHOUT CONTRAST TECHNIQUE: Multidetector CT imaging of the head and cervical spine was performed following the standard protocol without intravenous contrast. Multiplanar CT image reconstructions of the cervical spine were also generated. RADIATION DOSE REDUCTION: This exam was performed according to the departmental dose-optimization program which includes automated exposure control, adjustment of the mA and/or kV according to patient size and/or use of iterative reconstruction technique.  COMPARISON:  CT head and cervical spine 09/06/2009 FINDINGS: CT HEAD FINDINGS Brain: There is no acute intracranial hemorrhage, extra-axial fluid collection, or acute infarct. Parenchymal volume is normal for age. The ventricles are normal in size. Gray-white differentiation is preserved. There is no mass lesion.  There is no mass effect or midline shift. Vascular: No hyperdense vessel or unexpected calcification. Skull: Normal. Negative for fracture or focal lesion. Sinuses/Orbits: The imaged paranasal sinuses are clear. The globes and orbits are unremarkable. Other: None. CT CERVICAL SPINE FINDINGS Alignment: Normal. Skull base and vertebrae:  Skull base alignment is normal. Vertebral body heights are preserved. There is no evidence of acute fracture. There is no suspicious osseous lesion. Soft tissues and spinal canal: No prevertebral fluid or swelling. No visible canal hematoma. 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 There is mild multilevel degenerative endplate change and facet arthropathy resulting in up to moderate to severe bilateral neural foraminal stenosis C4-C5 and C5-C6. Upper chest: The imaged lung apices are clear. Other: None. IMPRESSION: 1. No acute intracranial pathology. 2. No acute fracture or traumatic malalignment of the cervical spine. 3. 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 Electronically Signed   By: Valetta Mole M.D.   On: 02/16/2022 11:49   CT Angio Chest PE W and/or Wo Contrast  Result Date: 02/16/2022 CLINICAL DATA:  Positive D-dimer, clinical suspicion for pulmonary embolism EXAM: CT ANGIOGRAPHY CHEST WITH CONTRAST TECHNIQUE: Multidetector CT imaging of the chest was performed using the standard protocol during bolus administration of intravenous contrast. Multiplanar CT image reconstructions and MIPs were obtained to  evaluate the vascular anatomy. RADIATION DOSE REDUCTION: This exam was performed according to the departmental dose-optimization program which includes automated exposure control, adjustment of the mA and/or kV according to patient size and/or use of iterative reconstruction technique. CONTRAST:  41m OMNIPAQUE IOHEXOL 350 MG/ML SOLN COMPARISON:  Chest radiograph done earlier today FINDINGS: Cardiovascular: There is homogeneous enhancement in thoracic aorta. Coronary artery calcifications are seen. There are no filling defects in the central pulmonary artery branches. Motion artifacts limit evaluation of small peripheral branches especially in the lower lung fields. Mediastinum/Nodes: Unremarkable. Lungs/Pleura: There is no focal pulmonary consolidation. Small linear density in the lateral aspect of right upper lobe may suggest minimal scarring or subsegmental atelectasis. There is no pleural effusion or pneumothorax. Upper Abdomen: Liver is enlarged in size. There is nodularity in the liver surface. There are possible faint low-density space-occupying lesions in the liver. This finding is not fully evaluated. Follow-up multiphasic CT or MRI should be considered. Musculoskeletal: Bony structures are unremarkable. Gynecomastia is seen in both breasts. Review of the MIP images confirms the above findings. IMPRESSION: There is no evidence of central pulmonary artery embolism. There is no evidence of thoracic aortic dissection. Coronary artery calcifications are seen. Nodularity in the liver surface suggests possible cirrhosis. There are faint ill-defined low-density foci in the liver. Possibility of neoplastic process in the liver is not excluded. Follow-up multiphasic CT or MRI should be considered. Electronically Signed   By: PElmer PickerM.D.   On: 02/16/2022 13:14   CT Cervical Spine Wo Contrast  Result Date: 02/16/2022 CLINICAL DATA:  Syncope loss of consciousness EXAM: CT HEAD WITHOUT CONTRAST CT  CERVICAL SPINE WITHOUT CONTRAST TECHNIQUE: Multidetector CT imaging of the head and cervical spine was performed following the standard protocol without intravenous contrast. Multiplanar CT image reconstructions of the cervical spine were also generated. RADIATION DOSE REDUCTION: This exam was performed according to the departmental dose-optimization program which includes automated exposure control, adjustment of the mA and/or kV according to patient size and/or use of iterative reconstruction technique. COMPARISON:  CT head and cervical spine 09/06/2009 FINDINGS: CT HEAD FINDINGS Brain: There is no acute intracranial hemorrhage, extra-axial fluid collection, or acute infarct. Parenchymal volume is normal for age. The ventricles are normal in size. Gray-white differentiation is preserved. There is no mass lesion.  There is no mass effect or midline shift.  Vascular: No hyperdense vessel or unexpected calcification. Skull: Normal. Negative for fracture or focal lesion. Sinuses/Orbits: The imaged paranasal sinuses are clear. The globes and orbits are unremarkable. Other: None. CT CERVICAL SPINE FINDINGS Alignment: Normal. Skull base and vertebrae: Skull base alignment is normal. Vertebral body heights are preserved. There is no evidence of acute fracture. There is no suspicious osseous lesion. Soft tissues and spinal canal: No prevertebral fluid or swelling. No visible canal hematoma. 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 There is mild multilevel degenerative endplate change and facet arthropathy resulting in up to moderate to severe bilateral neural foraminal stenosis C4-C5 and C5-C6. Upper chest: The imaged lung apices are clear. Other: None. IMPRESSION: 1. No acute intracranial pathology. 2. No acute fracture or traumatic malalignment of the cervical spine. 3. Multilevel degenerative changes and ossification of the  posterior longitudinal ligament as above. Electronically Signed   By: Valetta Mole M.D.   On: 02/16/2022 11:49   ECHOCARDIOGRAM COMPLETE  Result Date: 02/16/2022    ECHOCARDIOGRAM REPORT   Patient Name:   Johnny Mcknight Date of Exam: 02/16/2022 Medical Rec #:  [id number]  Height:       72.0 in Accession #:    [id number] Weight:       166.0 lb Date of Birth:  11-Apr-1956  BSA:          1.968 m Patient Age:    27 years   BP:           155/85 mmHg Patient Gender: M          HR:           96 bpm. Exam Location:  Inpatient Procedure: 2D Echo, 3D Echo, Cardiac Doppler and Color Doppler Indications:    R55 Syncope  History:        Patient has prior history of Echocardiogram examinations, most                 recent 09/25/2015. Abnormal ECG, COPD, Signs/Symptoms:Bacteremia                 and Dyspnea; Risk Factors:Hypertension. ETOH.  Sonographer:    Roseanna Rainbow RDCS Referring Phys: [id number] [place]  Sonographer Comments: Study paused for patient care. IMPRESSIONS  1. Left ventricular ejection fraction, by estimation, is 70 to 75%. The left ventricle has hyperdynamic function. The left ventricle has no regional wall motion abnormalities. There is mild left ventricular hypertrophy. Left ventricular diastolic parameters are consistent with Grade I diastolic dysfunction (impaired relaxation).  2. Right ventricular systolic function is normal. The right ventricular size is normal. There is normal pulmonary artery systolic pressure.  3. Left atrial size was moderately dilated.  4. The mitral valve is normal in structure. Trivial mitral valve regurgitation. No evidence of mitral stenosis.  5. The aortic valve is tricuspid. Aortic valve regurgitation is not visualized. No aortic stenosis is present.  6. The inferior vena cava is normal in size with greater than 50% respiratory variability, suggesting right atrial pressure of 3 mmHg. FINDINGS  Left Ventricle: Left ventricular ejection fraction, by estimation, is 70 to 75%. The  left ventricle has hyperdynamic function. The left ventricle has no regional wall motion abnormalities. The left ventricular internal cavity size was normal in size. There is mild left ventricular hypertrophy. Left ventricular diastolic parameters are consistent with Grade I diastolic dysfunction (impaired relaxation). Indeterminate filling pressures. Right Ventricle: The  right ventricular size is normal. No increase in right ventricular wall thickness. Right ventricular systolic function is normal. There is normal pulmonary artery systolic pressure. The tricuspid regurgitant velocity is 1.74 m/s, and  with an assumed right atrial pressure of 3 mmHg, the estimated right ventricular systolic pressure is 90.2 mmHg. Left Atrium: Left atrial size was moderately dilated. Right Atrium: Right atrial size was normal in size. Pericardium: There is no evidence of pericardial effusion. Mitral Valve: The mitral valve is normal in structure. Trivial mitral valve regurgitation. No evidence of mitral valve stenosis. Tricuspid Valve: The tricuspid valve is normal in structure. Tricuspid valve regurgitation is trivial. No evidence of tricuspid stenosis. Aortic Valve: The aortic valve is tricuspid. Aortic valve regurgitation is not visualized. No aortic stenosis is present. Pulmonic Valve: The pulmonic valve was normal in structure. Pulmonic valve regurgitation is not visualized. No evidence of pulmonic stenosis. Aorta: The aortic root is normal in size and structure. Venous: The inferior vena cava is normal in size with greater than 50% respiratory variability, suggesting right atrial pressure of 3 mmHg. IAS/Shunts: No atrial level shunt detected by color flow Doppler.  LEFT VENTRICLE PLAX 2D LVIDd:         4.50 cm     Diastology LVIDs:         2.70 cm     LV e' medial:    7.62 cm/s LV PW:         1.00 cm     LV E/e' medial:  12.9 LV IVS:        1.10 cm     LV e' lateral:   14.60 cm/s LVOT diam:     2.20 cm     LV E/e' lateral: 6.7  LV SV:         118 LV SV Index:   60 LVOT Area:     3.80 cm                             3D Volume EF: LV Volumes (MOD)           3D EF:        62 % LV vol d, MOD A2C: 66.0 ml LV EDV:       136 ml LV vol d, MOD A4C: 78.0 ml LV ESV:       52 ml LV vol s, MOD A2C: 22.2 ml LV SV:        85 ml LV vol s, MOD A4C: 27.7 ml LV SV MOD A2C:     43.8 ml LV SV MOD A4C:     78.0 ml LV SV MOD BP:      47.6 ml RIGHT VENTRICLE             IVC RV S prime:     10.20 cm/s  IVC diam: 1.80 cm TAPSE (M-mode): 2.4 cm LEFT ATRIUM             Index        RIGHT ATRIUM           Index LA diam:        2.70 cm 1.37 cm/m   RA Area:     15.60 cm LA Vol (A2C):   76.4 ml 38.81 ml/m  RA Volume:   34.90 ml  17.73 ml/m LA Vol (A4C):   53.7 ml 27.28 ml/m LA Biplane Vol: 65.6 ml 33.33 ml/m  AORTIC VALVE LVOT Vmax:  177.00 cm/s LVOT Vmean:  118.000 cm/s LVOT VTI:    0.310 m  AORTA Ao Root diam: 3.20 cm Ao Asc diam:  2.80 cm MITRAL VALVE                TRICUSPID VALVE MV Area (PHT): 5.02 cm     TR Peak grad:   12.1 mmHg MV Decel Time: 151 msec     TR Vmax:        174.00 cm/s MV E velocity: 98.50 cm/s MV A velocity: 114.00 cm/s  SHUNTS MV E/A ratio:  0.86         Systemic VTI:  0.31 m                             Systemic Diam: 2.20 cm Skeet Latch MD Electronically signed by Skeet Latch MD Signature Date/Time: 02/16/2022/5:10:58 PM    Final         Scheduled Meds:  Chlorhexidine Gluconate Cloth  6 each Topical Daily   folic acid  1 mg Oral Daily   LORazepam  0-4 mg Intravenous Q4H   Followed by   Derrill Memo ON 02/18/2022] LORazepam  0-4 mg Intravenous Q8H   mouth rinse  15 mL Mouth Rinse BID   multivitamin with minerals  1 tablet Oral Daily   pantoprazole (PROTONIX) IV  40 mg Intravenous Q12H   sodium chloride flush  3 mL Intravenous Q12H   thiamine  100 mg Oral Daily   Or   thiamine  100 mg Intravenous Daily   Continuous Infusions:   LOS: 0 days    Time spent: 45 minutes spent on chart review, discussion with nursing  staff, consultants, updating family and interview/physical exam; more than 50% of that time was spent in counseling and/or coordination of care.    [name], DO [facility] Available via Epic secure chat 7am-7pm After these hours, please refer to coverage provider listed on [email or web address] 02/17/2022, 11:35 AM

## 2022-02-17 NOTE — Hospital Course (Signed)
Johnny Mcknight is a 66 y.o. male with medical history significant of GERD, alcoholic polyneuropathy, streptococcal pneumonia, thiamine deficiency, vitamin D deficiency, zinc deficiency who is coming to the emergency department due to having a syncopal episode earlier this morning while getting up to go to the bathroom.  This is the third episode in the last 2 weeks.  EGD/colonoscopy planned for 6/2

## 2022-02-18 ENCOUNTER — Encounter (HOSPITAL_COMMUNITY)
Admission: EM | Disposition: A | Discharge status: Home Health | Payer: Self-pay | Source: Home / Self Care | Attending: Internal Medicine

## 2022-02-18 ENCOUNTER — Inpatient Hospital Stay (HOSPITAL_COMMUNITY): Payer: Medicare HMO | Admitting: Anesthesiology

## 2022-02-18 ENCOUNTER — Encounter (HOSPITAL_COMMUNITY): Payer: Self-pay | Admitting: Internal Medicine

## 2022-02-18 ENCOUNTER — Inpatient Hospital Stay (HOSPITAL_COMMUNITY): Payer: Medicare HMO

## 2022-02-18 DIAGNOSIS — K643 Fourth degree hemorrhoids: Secondary | ICD-10-CM

## 2022-02-18 DIAGNOSIS — I8500 Esophageal varices without bleeding: Secondary | ICD-10-CM

## 2022-02-18 DIAGNOSIS — D122 Benign neoplasm of ascending colon: Secondary | ICD-10-CM

## 2022-02-18 DIAGNOSIS — K2970 Gastritis, unspecified, without bleeding: Secondary | ICD-10-CM | POA: Diagnosis not present

## 2022-02-18 DIAGNOSIS — I8510 Secondary esophageal varices without bleeding: Secondary | ICD-10-CM

## 2022-02-18 DIAGNOSIS — K635 Polyp of colon: Secondary | ICD-10-CM

## 2022-02-18 DIAGNOSIS — D649 Anemia, unspecified: Secondary | ICD-10-CM | POA: Diagnosis not present

## 2022-02-18 HISTORY — PX: POLYPECTOMY: SHX5525

## 2022-02-18 HISTORY — PX: COLONOSCOPY WITH PROPOFOL: SHX5780

## 2022-02-18 HISTORY — PX: ESOPHAGOGASTRODUODENOSCOPY (EGD) WITH PROPOFOL: SHX5813

## 2022-02-18 HISTORY — PX: BIOPSY: SHX5522

## 2022-02-18 LAB — BASIC METABOLIC PANEL
Anion gap: 10 (ref 5–15)
BUN: 16 mg/dL (ref 8–23)
CO2: 18 mmol/L — ABNORMAL LOW (ref 22–32)
Calcium: 8.9 mg/dL (ref 8.9–10.3)
Chloride: 108 mmol/L (ref 98–111)
Creatinine, Ser: 1.91 mg/dL — ABNORMAL HIGH (ref 0.61–1.24)
GFR, Estimated: 39 mL/min — ABNORMAL LOW (ref >60)
Glucose, Bld: 115 mg/dL — ABNORMAL HIGH (ref 70–99)
Potassium: 3.6 mmol/L (ref 3.5–5.1)
Sodium: 136 mmol/L (ref 135–145)

## 2022-02-18 LAB — HEPATITIS A ANTIBODY, TOTAL: hep A Total Ab: REACTIVE — AB

## 2022-02-18 LAB — CBC
HCT: 24.6 % — ABNORMAL LOW (ref 39.0–52.0)
Hemoglobin: 7.9 g/dL — ABNORMAL LOW (ref 13.0–17.0)
MCH: 27.4 pg (ref 26.0–34.0)
MCHC: 32.1 g/dL (ref 30.0–36.0)
MCV: 85.4 fL (ref 80.0–100.0)
Platelets: 173 10*3/uL (ref 150–400)
RBC: 2.88 MIL/uL — ABNORMAL LOW (ref 4.22–5.81)
RDW: 15.5 % (ref 11.5–15.5)
WBC: 10 10*3/uL (ref 4.0–10.5)
nRBC: 0 % (ref 0.0–0.2)

## 2022-02-18 LAB — CEA: CEA: 2.9 ng/mL (ref 0.0–4.7)

## 2022-02-18 LAB — AFP TUMOR MARKER: AFP, Serum, Tumor Marker: <1.8 ng/mL (ref 0.0–8.4)

## 2022-02-18 LAB — HEPATITIS B SURFACE ANTIBODY,QUALITATIVE: Hep B S Ab: NONREACTIVE

## 2022-02-18 LAB — GLUCOSE, CAPILLARY: Glucose-Capillary: 104 mg/dL — ABNORMAL HIGH (ref 70–99)

## 2022-02-18 SURGERY — COLONOSCOPY WITH PROPOFOL
Anesthesia: Monitor Anesthesia Care

## 2022-02-18 MED ORDER — IOHEXOL 300 MG/ML  SOLN
100.0000 mL | Freq: Once | INTRAMUSCULAR | Status: AC | PRN
  Administered 2022-02-18: 80 mL via INTRAVENOUS

## 2022-02-18 MED ORDER — PROPOFOL 500 MG/50ML IV EMUL
INTRAVENOUS | Status: DC | PRN
  Administered 2022-02-18: 150 ug/kg/min via INTRAVENOUS

## 2022-02-18 MED ORDER — SODIUM CHLORIDE (PF) 0.9 % IJ SOLN
INTRAMUSCULAR | Status: AC
  Filled 2022-02-18: qty 50

## 2022-02-18 MED ORDER — LIDOCAINE 2% (20 MG/ML) 5 ML SYRINGE
INTRAMUSCULAR | Status: DC | PRN
  Administered 2022-02-18: 80 mg via INTRAVENOUS

## 2022-02-18 MED ORDER — LACTATED RINGERS IV SOLN: INTRAVENOUS | Status: DC

## 2022-02-18 MED ORDER — PROPOFOL 10 MG/ML IV BOLUS
INTRAVENOUS | Status: DC | PRN
  Administered 2022-02-18 (×2): 50 mg via INTRAVENOUS

## 2022-02-18 MED ORDER — SODIUM CHLORIDE 0.9 % IV SOLN
INTRAVENOUS | Status: DC
Start: 2022-02-18 — End: 2022-02-21

## 2022-02-18 MED ORDER — PROPOFOL 1000 MG/100ML IV EMUL
INTRAVENOUS | Status: AC
  Filled 2022-02-18: qty 100

## 2022-02-18 SURGICAL SUPPLY — 25 items

## 2022-02-18 NOTE — Anesthesia Preprocedure Evaluation (Signed)
Anesthesia Evaluation  Patient identified by MRN, date of birth, ID band Patient awake    Reviewed: Allergy & Precautions, NPO status , Patient's Chart, lab work & pertinent test results  Airway Mallampati: II  TM Distance: >3 FB Neck ROM: Full    Dental no notable dental hx.    Pulmonary neg pulmonary ROS, Current Smoker,    Pulmonary exam normal breath sounds clear to auscultation       Cardiovascular hypertension, Normal cardiovascular exam Rhythm:Regular Rate:Normal     Neuro/Psych negative neurological ROS  negative psych ROS   GI/Hepatic negative GI ROS, (+)     substance abuse  alcohol use, Alcoholic   Endo/Other  negative endocrine ROS  Renal/GU negative Renal ROS  negative genitourinary   Musculoskeletal negative musculoskeletal ROS (+)   Abdominal   Peds negative pediatric ROS (+)  Hematology  (+) Blood dyscrasia, anemia ,   Anesthesia Other Findings   Reproductive/Obstetrics negative OB ROS                             Anesthesia Physical Anesthesia Plan  ASA: 3  Anesthesia Plan: MAC   Post-op Pain Management: Minimal or no pain anticipated   Induction: Intravenous  PONV Risk Score and Plan: 1 and Propofol infusion and Treatment may vary due to age or medical condition  Airway Management Planned: Simple Face Mask  Additional Equipment:   Intra-op Plan:   Post-operative Plan:   Informed Consent: I have reviewed the patients History and Physical, chart, labs and discussed the procedure including the risks, benefits and alternatives for the proposed anesthesia with the patient or authorized representative who has indicated his/her understanding and acceptance.     Dental advisory given  Plan Discussed with: CRNA and Surgeon  Anesthesia Plan Comments:         Anesthesia Quick Evaluation

## 2022-02-18 NOTE — Progress Notes (Signed)
PROGRESS NOTE    Johnny Mcknight  WNU:[id number] DOB: July 25, 1956 DOA: 02/16/2022 PCP: Cassandria Anger, MD    Brief Narrative:  Johnny Mcknight is a 66 y.o. male with medical history significant of GERD, alcoholic polyneuropathy, streptococcal pneumonia, thiamine deficiency, vitamin D deficiency, zinc deficiency who is coming to the emergency department due to having a syncopal episode earlier this morning while getting up to go to the bathroom.  This is the third episode in the last 2 weeks.  EGD/colonoscopy planned for 6/2    Assessment and Plan:  Symptomatic anemia -PRBC x3. -Monitor hematocrit and hemoglobin. -Pantoprazole 40 mg IVP every 12 hours. -Alcohol cessation strongly suggested. -GI to do egd/colonoscopy     Alcohol dependence with other alcohol-induced disorder ([place]) Begin CIWA protocol with lorazepam. Folate, MVI and thiamine supplementation. Alcohol cessation advised. -TOC consult     GERD Continue pantoprazole.     Hypomagnesemia -replete     HTN (hypertension) -does not appear to be on any home meds     Positive D dimer CTA chest negative.   AKI -gentle IVF -recheck in AM   DVT prophylaxis: SCDs Start: 02/16/22 1231    Code Status: Full Code Family Communication:   Disposition Plan:  Level of care: Telemetry Status is: inpt     Consultants:  GI    Subjective: Tired after bowel prep and stools overnight  Objective: Vitals:   02/17/22 1622 02/17/22 1954 02/18/22 0453 02/18/22 0527  BP: (!) 144/81 (!) 158/89 (!) 146/84   Pulse: 85 93 95   Resp: 16 (!) 22 20   Temp: 99.2 F (37.3 C) 99 F (37.2 C) 99 F (37.2 C)   TempSrc: Oral Oral Oral   SpO2: 100% 100% 100%   Weight:    74.3 kg  Height:        Intake/Output Summary (Last 24 hours) at 02/18/2022 1142 Last data filed at 02/18/2022 0706 Gross per 24 hour  Intake 0 ml  Output 975 ml  Net -975 ml   Filed Weights   02/16/22 1454 02/17/22 0500 02/18/22 0527  Weight: 74.3 kg 66.7 kg  74.3 kg    Examination:   General: Appearance:    Thin male in no acute distress     Lungs:     respirations unlabored  Heart:    Normal heart rate. Normal rhythm. No murmurs, rubs, or gallops.    MS:   All extremities are intact.    Neurologic:   Awake, alert, oriented x 3. No apparent focal neurological           defect.        Data Reviewed: I have personally reviewed following labs and imaging studies  CBC: Recent Labs  Lab 02/16/22 1033 02/17/22 0006 02/18/22 0548  WBC 8.7 10.1 10.0  HGB 4.4* 7.8* 7.9*  HCT 14.6* 23.9* 24.6*  MCV 85.9 84.8 85.4  PLT 197 164 034   Basic Metabolic Panel: Recent Labs  Lab 02/16/22 1033 02/17/22 0006 02/17/22 0016 02/18/22 0548  NA 142 136  --  136  K 3.5 3.6  --  3.6  CL 113* 109  --  108  CO2 17* 18*  --  18*  GLUCOSE 109* 108*  --  115*  BUN 16 14  --  16  CREATININE 1.50* 1.37*  --  1.91*  CALCIUM 8.8* 8.3*  --  8.9  MG 1.5*  --  1.9  --    GFR: Estimated Creatinine Clearance: 41.6 mL/min (A) (  by C-G formula based on SCr of 1.91 mg/dL (H)). Liver Function Tests: Recent Labs  Lab 02/16/22 1033 02/17/22 0006  AST 75* 75*  ALT 18 19  ALKPHOS 192* 180*  BILITOT 1.1 3.2*  PROT 8.6* 8.0  ALBUMIN 3.0* 2.9*   No results for input(s): LIPASE, AMYLASE in the last 168 hours. No results for input(s): AMMONIA in the last 168 hours. Coagulation Profile: Recent Labs  Lab 02/16/22 1139  INR 1.2   Cardiac Enzymes: No results for input(s): CKTOTAL, CKMB, CKMBINDEX, TROPONINI in the last 168 hours. BNP (last 3 results) No results for input(s): PROBNP in the last 8760 hours. HbA1C: No results for input(s): HGBA1C in the last 72 hours. CBG: Recent Labs  Lab 02/16/22 1029 02/17/22 0510 02/18/22 0453  GLUCAP 108* 110* 104*   Lipid Profile: No results for input(s): CHOL, HDL, LDLCALC, TRIG, CHOLHDL, LDLDIRECT in the last 72 hours. Thyroid Function Tests: No results for input(s): TSH, T4TOTAL, FREET4, T3FREE,  THYROIDAB in the last 72 hours. Anemia Panel: Recent Labs    02/16/22 1033 02/16/22 1139  VITAMINB12  --  522  FOLATE  --  4.9*  FERRITIN  --  8*  TIBC  --  448  IRON  --  13*  RETICCTPCT 1.9  --    Sepsis Labs: Recent Labs  Lab 02/16/22 1139 02/16/22 1305 02/17/22 0006  LATICACIDVEN 4.7* 4.1* 1.2    Recent Results (from the past 240 hour(s))  MRSA Next Gen by PCR, Nasal     Status: None   Collection Time: 02/16/22  2:26 PM   Specimen: Nasal Mucosa; Nasal Swab  Result Value Ref Range Status   MRSA by PCR Next Gen NOT DETECTED NOT DETECTED Final    Comment: (NOTE) The GeneXpert MRSA Assay (FDA approved for NASAL specimens only), is one component of a comprehensive MRSA colonization surveillance program. It is not intended to diagnose MRSA infection nor to guide or monitor treatment for MRSA infections. Test performance is not FDA approved in patients less than 25 years old. Performed at [facility], [place] [address], [place], [place] [address]          Radiology Studies: CT Angio Chest PE W and/or Wo Contrast  Result Date: 02/16/2022 CLINICAL DATA:  Positive D-dimer, clinical suspicion for pulmonary embolism EXAM: CT ANGIOGRAPHY CHEST WITH CONTRAST TECHNIQUE: Multidetector CT imaging of the chest was performed using the standard protocol during bolus administration of intravenous contrast. Multiplanar CT image reconstructions and MIPs were obtained to evaluate the vascular anatomy. RADIATION DOSE REDUCTION: This exam was performed according to the departmental dose-optimization program which includes automated exposure control, adjustment of the mA and/or kV according to patient size and/or use of iterative reconstruction technique. CONTRAST:  93m OMNIPAQUE IOHEXOL 350 MG/ML SOLN COMPARISON:  Chest radiograph done earlier today FINDINGS: Cardiovascular: There is homogeneous enhancement in thoracic aorta. Coronary artery calcifications are seen. There  are no filling defects in the central pulmonary artery branches. Motion artifacts limit evaluation of small peripheral branches especially in the lower lung fields. Mediastinum/Nodes: Unremarkable. Lungs/Pleura: There is no focal pulmonary consolidation. Small linear density in the lateral aspect of right upper lobe may suggest minimal scarring or subsegmental atelectasis. There is no pleural effusion or pneumothorax. Upper Abdomen: Liver is enlarged in size. There is nodularity in the liver surface. There are possible faint low-density space-occupying lesions in the liver. This finding is not fully evaluated. Follow-up multiphasic CT or MRI should be considered. Musculoskeletal: Bony structures are  unremarkable. Gynecomastia is seen in both breasts. Review of the MIP images confirms the above findings. IMPRESSION: There is no evidence of central pulmonary artery embolism. There is no evidence of thoracic aortic dissection. Coronary artery calcifications are seen. Nodularity in the liver surface suggests possible cirrhosis. There are faint ill-defined low-density foci in the liver. Possibility of neoplastic process in the liver is not excluded. Follow-up multiphasic CT or MRI should be considered. Electronically Signed   By: Elmer Picker M.D.   On: 02/16/2022 13:14   ECHOCARDIOGRAM COMPLETE  Result Date: 02/16/2022    ECHOCARDIOGRAM REPORT   Patient Name:   Johnny Mcknight Date of Exam: 02/16/2022 Medical Rec #:  [id number]  Height:       72.0 in Accession #:    [id number] Weight:       166.0 lb Date of Birth:  10-23-1955  BSA:          1.968 m Patient Age:    37 years   BP:           155/85 mmHg Patient Gender: M          HR:           96 bpm. Exam Location:  Inpatient Procedure: 2D Echo, 3D Echo, Cardiac Doppler and Color Doppler Indications:    R55 Syncope  History:        Patient has prior history of Echocardiogram examinations, most                 recent 09/25/2015. Abnormal ECG, COPD,  Signs/Symptoms:Bacteremia                 and Dyspnea; Risk Factors:Hypertension. ETOH.  Sonographer:    Roseanna Rainbow RDCS Referring Phys: [id number] [place]  Sonographer Comments: Study paused for patient care. IMPRESSIONS  1. Left ventricular ejection fraction, by estimation, is 70 to 75%. The left ventricle has hyperdynamic function. The left ventricle has no regional wall motion abnormalities. There is mild left ventricular hypertrophy. Left ventricular diastolic parameters are consistent with Grade I diastolic dysfunction (impaired relaxation).  2. Right ventricular systolic function is normal. The right ventricular size is normal. There is normal pulmonary artery systolic pressure.  3. Left atrial size was moderately dilated.  4. The mitral valve is normal in structure. Trivial mitral valve regurgitation. No evidence of mitral stenosis.  5. The aortic valve is tricuspid. Aortic valve regurgitation is not visualized. No aortic stenosis is present.  6. The inferior vena cava is normal in size with greater than 50% respiratory variability, suggesting right atrial pressure of 3 mmHg. FINDINGS  Left Ventricle: Left ventricular ejection fraction, by estimation, is 70 to 75%. The left ventricle has hyperdynamic function. The left ventricle has no regional wall motion abnormalities. The left ventricular internal cavity size was normal in size. There is mild left ventricular hypertrophy. Left ventricular diastolic parameters are consistent with Grade I diastolic dysfunction (impaired relaxation). Indeterminate filling pressures. Right Ventricle: The right ventricular size is normal. No increase in right ventricular wall thickness. Right ventricular systolic function is normal. There is normal pulmonary artery systolic pressure. The tricuspid regurgitant velocity is 1.74 m/s, and  with an assumed right atrial pressure of 3 mmHg, the estimated right ventricular systolic pressure is 13.2 mmHg. Left Atrium: Left  atrial size was moderately dilated. Right Atrium: Right atrial size was normal in size. Pericardium: There is no evidence of pericardial effusion. Mitral Valve: The mitral valve is normal in structure. Trivial mitral valve  regurgitation. No evidence of mitral valve stenosis. Tricuspid Valve: The tricuspid valve is normal in structure. Tricuspid valve regurgitation is trivial. No evidence of tricuspid stenosis. Aortic Valve: The aortic valve is tricuspid. Aortic valve regurgitation is not visualized. No aortic stenosis is present. Pulmonic Valve: The pulmonic valve was normal in structure. Pulmonic valve regurgitation is not visualized. No evidence of pulmonic stenosis. Aorta: The aortic root is normal in size and structure. Venous: The inferior vena cava is normal in size with greater than 50% respiratory variability, suggesting right atrial pressure of 3 mmHg. IAS/Shunts: No atrial level shunt detected by color flow Doppler.  LEFT VENTRICLE PLAX 2D LVIDd:         4.50 cm     Diastology LVIDs:         2.70 cm     LV e' medial:    7.62 cm/s LV PW:         1.00 cm     LV E/e' medial:  12.9 LV IVS:        1.10 cm     LV e' lateral:   14.60 cm/s LVOT diam:     2.20 cm     LV E/e' lateral: 6.7 LV SV:         118 LV SV Index:   60 LVOT Area:     3.80 cm                             3D Volume EF: LV Volumes (MOD)           3D EF:        62 % LV vol d, MOD A2C: 66.0 ml LV EDV:       136 ml LV vol d, MOD A4C: 78.0 ml LV ESV:       52 ml LV vol s, MOD A2C: 22.2 ml LV SV:        85 ml LV vol s, MOD A4C: 27.7 ml LV SV MOD A2C:     43.8 ml LV SV MOD A4C:     78.0 ml LV SV MOD BP:      47.6 ml RIGHT VENTRICLE             IVC RV S prime:     10.20 cm/s  IVC diam: 1.80 cm TAPSE (M-mode): 2.4 cm LEFT ATRIUM             Index        RIGHT ATRIUM           Index LA diam:        2.70 cm 1.37 cm/m   RA Area:     15.60 cm LA Vol (A2C):   76.4 ml 38.81 ml/m  RA Volume:   34.90 ml  17.73 ml/m LA Vol (A4C):   53.7 ml 27.28 ml/m LA  Biplane Vol: 65.6 ml 33.33 ml/m  AORTIC VALVE LVOT Vmax:   177.00 cm/s LVOT Vmean:  118.000 cm/s LVOT VTI:    0.310 m  AORTA Ao Root diam: 3.20 cm Ao Asc diam:  2.80 cm MITRAL VALVE                TRICUSPID VALVE MV Area (PHT): 5.02 cm     TR Peak grad:   12.1 mmHg MV Decel Time: 151 msec     TR Vmax:        174.00 cm/s MV E velocity: 98.50 cm/s  MV A velocity: 114.00 cm/s  SHUNTS MV E/A ratio:  0.86         Systemic VTI:  0.31 m                             Systemic Diam: 2.20 cm Skeet Latch MD Electronically signed by Skeet Latch MD Signature Date/Time: 02/16/2022/5:10:58 PM    Final         Scheduled Meds:  folic acid  1 mg Oral Daily   LORazepam  0-4 mg Intravenous Q4H   Followed by   LORazepam  0-4 mg Intravenous Q8H   mouth rinse  15 mL Mouth Rinse BID   multivitamin with minerals  1 tablet Oral Daily   pantoprazole (PROTONIX) IV  40 mg Intravenous Q12H   sodium chloride (PF)       sodium chloride flush  3 mL Intravenous Q12H   thiamine  100 mg Oral Daily   Or   thiamine  100 mg Intravenous Daily   Continuous Infusions:  sodium chloride 100 mL/hr at 02/18/22 0824     LOS: 1 day    Time spent: 45 minutes spent on chart review, discussion with nursing staff, consultants, updating family and interview/physical exam; more than 50% of that time was spent in counseling and/or coordination of care.    [name], DO [facility] Available via Epic secure chat 7am-7pm After these hours, please refer to coverage provider listed on [email or web address] 02/18/2022, 11:42 AM

## 2022-02-18 NOTE — Op Note (Signed)
[facility] Patient Name: Johnny Mcknight Procedure Date: 02/18/2022 MRN: [id number] Attending MD: Jackquline Denmark , MD Date of Birth: 03/20/1956 CSN: [id number] Age: 66 Admit Type: Outpatient Procedure:                Upper GI endoscopy Indications:              Severe symptomatic anemia. 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 Estimated Blood Loss:     Estimated blood loss: none. Procedure:                Pre-Anesthesia Assessment:                           - Prior to the procedure, a History and Physical                            was performed, and patient medications and                            allergies were reviewed. The patient's tolerance of                            previous anesthesia was also reviewed. The risks                            and benefits of the procedure and the sedation                            options and risks were discussed with the patient.                            All questions were answered, and informed consent                            was obtained. Prior Anticoagulants: The patient has                            taken no previous anticoagulant or antiplatelet                            agents. ASA Grade Assessment: III - A patient with                            severe  systemic disease. After reviewing the risks                            and benefits, the patient was deemed in                            satisfactory condition to undergo the procedure.                           After obtaining informed consent, the endoscope was                            passed under direct vision. Throughout the                            procedure, the patient's blood pressure,  pulse, and                            oxygen saturations were monitored continuously. The                            [id number]-[id number] ([id number]) Olympus endoscope was introduced                            through the mouth, and advanced to the second part                            of duodenum. The upper GI endoscopy was                            accomplished without difficulty. The patient                            tolerated the procedure well. Scope In: Scope Out: Findings:      2 channels of grade 0- I varices were found in the lower third of the       esophagus. Too small for EVL. Z-line well-defined at 40 cm.      Localized mild inflammation characterized by erosions and erythema was       found in the gastric antrum. Biopsies were taken with a cold forceps for       histology. No fundal varices or GAVE.      The examined duodenum was normal. Biopsies for histology were taken with       a cold forceps for evaluation of celiac disease. Impression:               - Grade 0-I esophageal varices. Too small for EVL                           - Gastritis. Biopsied.                           - Normal examined duodenum. Biopsied. Moderate Sedation:      Not Applicable - Patient had care per Anesthesia. Recommendation:           -  Return patient to hospital ward for ongoing care.                           - Resume previous diet.                           - Continue present medications including Protonix                            40 mg p.o. once a day (note that patient is not                            allergic to omeprazole but intolerant-had diarrhea                            previously).                           - No aspirin, ibuprofen, naproxen, or other                            non-steroidal anti-inflammatory drugs.                           - Stop ETOH.                           - Proceed with colonoscopy.                           - The findings and recommendations were discussed                             with the patient's family. Procedure Code(s):        --- Professional ---                           [phone], Esophagogastroduodenoscopy, flexible,                            transoral; with biopsy, single or multiple Diagnosis Code(s):        --- Professional ---                           I85.00, Esophageal varices without bleeding                           K29.70, Gastritis, unspecified, without bleeding                           R10.13, Epigastric pain CPT copyright 2019 [employer]. All rights reserved. The codes documented in this report are preliminary and upon coder review may  be revised to meet current compliance requirements. Jackquline Denmark, MD 02/18/2022 1:44:26 PM This report has been signed electronically. Number of Addenda: 0

## 2022-02-18 NOTE — Progress Notes (Addendum)
Patient ID: Johnny Mcknight, male   DOB: 06/01/1956, 66 y.o.   MRN: [id number]    Progress Note   Subjective   Day # 2  CC; severe anemia, syncope-hemoglobin 4.4 on admission  Labs today-hemoglobin 7.9/hematocrit 24.6/platelets 173 CEA pending/AFP pending Potassium 3.6/BUN 16/creatinine 1.91  Patient says he feels okay, was up most of the night completing the prep and stool is clear liquid.  No complaints of abdominal discomfort, no nausea or vomiting.  Objective   Vital signs in last 24 hours: Temp:  [98.2 F (36.8 C)-99.2 F (37.3 C)] 99 F (37.2 C) (06/02 0453) Pulse Rate:  [81-101] 95 (06/02 0453) Resp:  [14-22] 20 (06/02 0453) BP: (116-167)/(67-125) 146/84 (06/02 0453) SpO2:  [99 %-100 %] 100 % (06/02 0453) Weight:  [74.3 kg] 74.3 kg (06/02 0527) Last BM Date : 02/17/22 General:    Older African-American male in NAD Heart:  Regular rate and rhythm; no murmurs Lungs: Respirations even and unlabored, lungs CTA bilaterally Abdomen:  Soft, nontender and nondistended. Normal bowel sounds. Extremities:  Without edema. Neurologic:  Alert and oriented,  grossly normal neurologically. Psych:  Cooperative. Normal mood and affect.  Intake/Output from previous day: 06/01 0701 - 06/02 0700 In: 3 [I.V.:3] Out: 975 [Urine:975] Intake/Output this shift: No intake/output data recorded.  Lab Results: Recent Labs    02/16/22 1033 02/17/22 0006 02/18/22 0548  WBC 8.7 10.1 10.0  HGB 4.4* 7.8* 7.9*  HCT 14.6* 23.9* 24.6*  PLT 197 164 173   BMET Recent Labs    02/16/22 1033 02/17/22 0006 02/18/22 0548  NA 142 136 136  K 3.5 3.6 3.6  CL 113* 109 108  CO2 17* 18* 18*  GLUCOSE 109* 108* 115*  BUN '16 14 16  '$ CREATININE 1.50* 1.37* 1.91*  CALCIUM 8.8* 8.3* 8.9   LFT Recent Labs    02/16/22 1033 02/17/22 0006  PROT 8.6* 8.0  ALBUMIN 3.0* 2.9*  AST 75* 75*  ALT 18 19  ALKPHOS 192* 180*  BILITOT 1.1 3.2*  BILIDIR 0.4*  --   IBILI 0.7  --    PT/INR Recent Labs     02/16/22 1139  LABPROT 14.8  INR 1.2    Studies/Results: DG Chest 2 View  Result Date: 02/16/2022 CLINICAL DATA:  Presyncope.  Shortness of breath. EXAM: CHEST - 2 VIEW COMPARISON:  11/16/2015 FINDINGS: Lateral view degraded by patient arm position. Numerous leads and wires project over the chest. Midline trachea. Normal heart size. No pleural effusion or pneumothorax. Clear lungs. IMPRESSION: No active cardiopulmonary disease. Electronically Signed   By: Abigail Miyamoto M.D.   On: 02/16/2022 11:31   CT Head Wo Contrast  Result Date: 02/16/2022 CLINICAL DATA:  Syncope loss of consciousness EXAM: CT HEAD WITHOUT CONTRAST CT CERVICAL SPINE WITHOUT CONTRAST TECHNIQUE: Multidetector CT imaging of the head and cervical spine was performed following the standard protocol without intravenous contrast. Multiplanar CT image reconstructions of the cervical spine were also generated. RADIATION DOSE REDUCTION: This exam was performed according to the departmental dose-optimization program which includes automated exposure control, adjustment of the mA and/or kV according to patient size and/or use of iterative reconstruction technique. COMPARISON:  CT head and cervical spine 09/06/2009 FINDINGS: CT HEAD FINDINGS Brain: There is no acute intracranial hemorrhage, extra-axial fluid collection, or acute infarct. Parenchymal volume is normal for age. The ventricles are normal in size. Gray-white differentiation is preserved. There is no mass lesion.  There is no mass effect or midline shift. Vascular: No hyperdense vessel or  unexpected calcification. Skull: Normal. Negative for fracture or focal lesion. Sinuses/Orbits: The imaged paranasal sinuses are clear. The globes and orbits are unremarkable. Other: None. CT CERVICAL SPINE FINDINGS Alignment: Normal. Skull base and vertebrae: Skull base alignment is normal. Vertebral body heights are preserved. There is no evidence of acute fracture. There is no suspicious osseous  lesion. Soft tissues and spinal canal: No prevertebral fluid or swelling. No visible canal hematoma. 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 There is mild multilevel degenerative endplate change and facet arthropathy resulting in up to moderate to severe bilateral neural foraminal stenosis C4-C5 and C5-C6. Upper chest: The imaged lung apices are clear. Other: None. IMPRESSION: 1. No acute intracranial pathology. 2. No acute fracture or traumatic malalignment of the cervical spine. 3. 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 Electronically Signed   By: Valetta Mole M.D.   On: 02/16/2022 11:49   CT Angio Chest PE W and/or Wo Contrast  Result Date: 02/16/2022 CLINICAL DATA:  Positive D-dimer, clinical suspicion for pulmonary embolism EXAM: CT ANGIOGRAPHY CHEST WITH CONTRAST TECHNIQUE: Multidetector CT imaging of the chest was performed using the standard protocol during bolus administration of intravenous contrast. Multiplanar CT image reconstructions and MIPs were obtained to evaluate the vascular anatomy. RADIATION DOSE REDUCTION: This exam was performed according to the departmental dose-optimization program which includes automated exposure control, adjustment of the mA and/or kV according to patient size and/or use of iterative reconstruction technique. CONTRAST:  40m OMNIPAQUE IOHEXOL 350 MG/ML SOLN COMPARISON:  Chest radiograph done earlier today FINDINGS: Cardiovascular: There is homogeneous enhancement in thoracic aorta. Coronary artery calcifications are seen. There are no filling defects in the central pulmonary artery branches. Motion artifacts limit evaluation of small peripheral branches especially in the lower lung fields. Mediastinum/Nodes: Unremarkable. Lungs/Pleura: There is no focal pulmonary consolidation. Small linear density in the  lateral aspect of right upper lobe may suggest minimal scarring or subsegmental atelectasis. There is no pleural effusion or pneumothorax. Upper Abdomen: Liver is enlarged in size. There is nodularity in the liver surface. There are possible faint low-density space-occupying lesions in the liver. This finding is not fully evaluated. Follow-up multiphasic CT or MRI should be considered. Musculoskeletal: Bony structures are unremarkable. Gynecomastia is seen in both breasts. Review of the MIP images confirms the above findings. IMPRESSION: There is no evidence of central pulmonary artery embolism. There is no evidence of thoracic aortic dissection. Coronary artery calcifications are seen. Nodularity in the liver surface suggests possible cirrhosis. There are faint ill-defined low-density foci in the liver. Possibility of neoplastic process in the liver is not excluded. Follow-up multiphasic CT or MRI should be considered. Electronically Signed   By: PElmer PickerM.D.   On: 02/16/2022 13:14   CT Cervical Spine Wo Contrast  Result Date: 02/16/2022 CLINICAL DATA:  Syncope loss of consciousness EXAM: CT HEAD WITHOUT CONTRAST CT CERVICAL SPINE WITHOUT CONTRAST TECHNIQUE: Multidetector CT imaging of the head and cervical spine was performed following the standard protocol without intravenous contrast. Multiplanar CT image reconstructions of the cervical spine were also generated. RADIATION DOSE REDUCTION: This exam was performed according to the departmental dose-optimization program which includes automated exposure control, adjustment of the mA and/or kV according to patient size and/or use of iterative reconstruction technique. COMPARISON:  CT head and cervical spine 09/06/2009 FINDINGS: CT HEAD FINDINGS Brain: There is no acute intracranial hemorrhage, extra-axial  fluid collection, or acute infarct. Parenchymal volume is normal for age. The ventricles are normal in size. Gray-white differentiation is  preserved. There is no mass lesion.  There is no mass effect or midline shift. Vascular: No hyperdense vessel or unexpected calcification. Skull: Normal. Negative for fracture or focal lesion. Sinuses/Orbits: The imaged paranasal sinuses are clear. The globes and orbits are unremarkable. Other: None. CT CERVICAL SPINE FINDINGS Alignment: Normal. Skull base and vertebrae: Skull base alignment is normal. Vertebral body heights are preserved. There is no evidence of acute fracture. There is no suspicious osseous lesion. Soft tissues and spinal canal: No prevertebral fluid or swelling. No visible canal hematoma. 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 There is mild multilevel degenerative endplate change and facet arthropathy resulting in up to moderate to severe bilateral neural foraminal stenosis C4-C5 and C5-C6. Upper chest: The imaged lung apices are clear. Other: None. IMPRESSION: 1. No acute intracranial pathology. 2. No acute fracture or traumatic malalignment of the cervical spine. 3. 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 Electronically Signed   By: Valetta Mole M.D.   On: 02/16/2022 11:49   ECHOCARDIOGRAM COMPLETE  Result Date: 02/16/2022    ECHOCARDIOGRAM REPORT   Patient Name:   NIRVAAN FRETT Date of Exam: 02/16/2022 Medical Rec #:  [id number]  Height:       72.0 in Accession #:    [id number] Weight:       166.0 lb Date of Birth:  1956-01-22  BSA:          1.968 m Patient Age:    44 years   BP:           155/85 mmHg Patient Gender: M          HR:           96 bpm. Exam Location:  Inpatient Procedure: 2D Echo, 3D Echo, Cardiac Doppler and Color Doppler Indications:    R55 Syncope  History:        Patient has prior history of Echocardiogram examinations, most                 recent 09/25/2015. Abnormal ECG, COPD, Signs/Symptoms:Bacteremia                  and Dyspnea; Risk Factors:Hypertension. ETOH.  Sonographer:    Roseanna Rainbow RDCS Referring Phys: [id number] [place]  Sonographer Comments: Study paused for patient care. IMPRESSIONS  1. Left ventricular ejection fraction, by estimation, is 70 to 75%. The left ventricle has hyperdynamic function. The left ventricle has no regional wall motion abnormalities. There is mild left ventricular hypertrophy. Left ventricular diastolic parameters are consistent with Grade I diastolic dysfunction (impaired relaxation).  2. Right ventricular systolic function is normal. The right ventricular size is normal. There is normal pulmonary artery systolic pressure.  3. Left atrial size was moderately dilated.  4. The mitral valve is normal in structure. Trivial mitral valve regurgitation. No evidence of mitral stenosis.  5. The aortic valve is tricuspid. Aortic valve regurgitation is not visualized. No aortic stenosis is present.  6. The inferior vena cava is normal in size with greater than 50% respiratory variability, suggesting right atrial pressure of 3 mmHg. FINDINGS  Left Ventricle: Left ventricular ejection fraction, by estimation, is 70 to 75%. The left ventricle has hyperdynamic function. The left ventricle has no regional wall motion abnormalities.  The left ventricular internal cavity size was normal in size. There is mild left ventricular hypertrophy. Left ventricular diastolic parameters are consistent with Grade I diastolic dysfunction (impaired relaxation). Indeterminate filling pressures. Right Ventricle: The right ventricular size is normal. No increase in right ventricular wall thickness. Right ventricular systolic function is normal. There is normal pulmonary artery systolic pressure. The tricuspid regurgitant velocity is 1.74 m/s, and  with an assumed right atrial pressure of 3 mmHg, the estimated right ventricular systolic pressure is 66.4 mmHg. Left Atrium: Left atrial size was moderately dilated. Right  Atrium: Right atrial size was normal in size. Pericardium: There is no evidence of pericardial effusion. Mitral Valve: The mitral valve is normal in structure. Trivial mitral valve regurgitation. No evidence of mitral valve stenosis. Tricuspid Valve: The tricuspid valve is normal in structure. Tricuspid valve regurgitation is trivial. No evidence of tricuspid stenosis. Aortic Valve: The aortic valve is tricuspid. Aortic valve regurgitation is not visualized. No aortic stenosis is present. Pulmonic Valve: The pulmonic valve was normal in structure. Pulmonic valve regurgitation is not visualized. No evidence of pulmonic stenosis. Aorta: The aortic root is normal in size and structure. Venous: The inferior vena cava is normal in size with greater than 50% respiratory variability, suggesting right atrial pressure of 3 mmHg. IAS/Shunts: No atrial level shunt detected by color flow Doppler.  LEFT VENTRICLE PLAX 2D LVIDd:         4.50 cm     Diastology LVIDs:         2.70 cm     LV e' medial:    7.62 cm/s LV PW:         1.00 cm     LV E/e' medial:  12.9 LV IVS:        1.10 cm     LV e' lateral:   14.60 cm/s LVOT diam:     2.20 cm     LV E/e' lateral: 6.7 LV SV:         118 LV SV Index:   60 LVOT Area:     3.80 cm                             3D Volume EF: LV Volumes (MOD)           3D EF:        62 % LV vol d, MOD A2C: 66.0 ml LV EDV:       136 ml LV vol d, MOD A4C: 78.0 ml LV ESV:       52 ml LV vol s, MOD A2C: 22.2 ml LV SV:        85 ml LV vol s, MOD A4C: 27.7 ml LV SV MOD A2C:     43.8 ml LV SV MOD A4C:     78.0 ml LV SV MOD BP:      47.6 ml RIGHT VENTRICLE             IVC RV S prime:     10.20 cm/s  IVC diam: 1.80 cm TAPSE (M-mode): 2.4 cm LEFT ATRIUM             Index        RIGHT ATRIUM           Index LA diam:        2.70 cm 1.37 cm/m   RA Area:     15.60 cm LA Vol (A2C):  76.4 ml 38.81 ml/m  RA Volume:   34.90 ml  17.73 ml/m LA Vol (A4C):   53.7 ml 27.28 ml/m LA Biplane Vol: 65.6 ml 33.33 ml/m  AORTIC VALVE  LVOT Vmax:   177.00 cm/s LVOT Vmean:  118.000 cm/s LVOT VTI:    0.310 m  AORTA Ao Root diam: 3.20 cm Ao Asc diam:  2.80 cm MITRAL VALVE                TRICUSPID VALVE MV Area (PHT): 5.02 cm     TR Peak grad:   12.1 mmHg MV Decel Time: 151 msec     TR Vmax:        174.00 cm/s MV E velocity: 98.50 cm/s MV A velocity: 114.00 cm/s  SHUNTS MV E/A ratio:  0.86         Systemic VTI:  0.31 m                             Systemic Diam: 2.20 cm Skeet Latch MD Electronically signed by Skeet Latch MD Signature Date/Time: 02/16/2022/5:10:58 PM    Final        Assessment / Plan:    #64 66 year old African-American male admitted with syncope, has had intermittent lightheaded and dizziness over the past couple of weeks and 2 previous syncopal episodes Work-up pertinent for profound iron deficiency anemia-hemoglobin 4.4 on admission, heme-negative  he does not have any active GI symptoms, unaware of any melena or hematochezia at home.  Scheduled for EGD and colonoscopy today  #2 history of chronic EtOH use/abuse #3 abnormal liver on CT angio on admission with nodular surface and concern for hypodense space-occupying lesions Patient unable to tolerate MRI, have scheduled for multiphasic CT for later today CEA and AFP ordered and pending  #4 elevated LFTs-rule out secondary to EtOH versus malignancy  Plan; as above EGD and colonoscopy today Multi phasic CT of the liver later today Transfuse to keep hemoglobin 7 Further recommendations pending results of above.     Principal Problem:   Symptomatic anemia Active Problems:   GERD   Hypomagnesemia   Alcohol dependence with other alcohol-induced disorder (HCC)   HTN (hypertension)   Hyperproteinemia   Positive D dimer   Hypoalbuminemia   Skin rash   Anemia     LOS: 1 day   Amy Esterwood PA-C 02/18/2022, 8:37 AM    Attending physician's note   I have taken history, reviewed the chart and examined the patient. I performed a substantive  portion of this encounter, including complete performance of at least one of the key components, in conjunction with the APP. I agree with the Advanced Practitioner's note, impression and recommendations.    CT (triphasic) c/w fatty liver/cirrhosis with pHTN (splenomegaly, portosystemic collaterals, no ascites) cannot r/o infiltrative HCC  Hb 8 (after 3U). No active GI bleeding.  Heme-negative  Plan: -For EGD/colon now -Follow CEA/AFP -MRI liver with contrast as outpatient (may need sedation) -Check acute hepatitis panel, iron studies, ceruloplasmin, A1AT, GGT, ASMA, AMA to complete work-up. -Check HBsAb titer and HAV total Ab.  If neg, would recommend vaccination for A and B. -No alcohol. -Avoid nonsteroidals. -Low-salt normal protein diet at D/C. -FU GI and hematology as outpt.    Carmell Austria, MD [facility] [phone]

## 2022-02-18 NOTE — TOC Transition Note (Signed)
Transition of Care [facility]) - CM/SW Discharge Note   Patient Details  Name: Johnny Mcknight MRN: [id number] Date of Birth: 11-17-1955  Transition of Care [facility]) CM/SW Contact:  Vassie Moselle, LCSW Phone Number: 02/18/2022, 10:40 AM   Clinical Narrative:    Met with pt and discussed alcohol use and impacts on functioning and physical health. Pt minimizes his alcohol use and compares his use to others he know whom drink more than he does. Pt reports he has never been to rehab or received SA services. Pt currently declining these services however is agreeable to information and resources being added to discharge information. Pt is aware to ask for CSW if decides he would like to discuss resources. TOC signing off at this time. If further needs arise please order [facility] consult.    Final next level of care: Home/Self Care Barriers to Discharge: No Barriers Identified   Patient Goals and CMS Choice Patient states their goals for this hospitalization and ongoing recovery are:: Return home      Discharge Placement                       Discharge Plan and Services                DME Arranged: N/A                    Social Determinants of Health (SDOH) Interventions     Readmission Risk Interventions    02/18/2022   10:40 AM  Readmission Risk Prevention Plan  Post Dischage Appt Complete  Medication Screening Complete  Transportation Screening Complete

## 2022-02-18 NOTE — Op Note (Signed)
[facility] Patient Name: Johnny Mcknight Procedure Date: 02/18/2022 MRN: [id number] Attending MD: Jackquline Denmark , MD Date of Birth: [birth year] CSN: [id number] Age: 66 Admit Type: Outpatient Procedure:                Colonoscopy Indications:              Severe symptomatic IDA with syncope without GI                            bleeding (Heme -ve stools). Hb 4.4 on adm s/p 3U to                            7.8. New Dx of liver cirrhosis. Providers:                Jackquline Denmark, MD, Allayne Gitelman, RN, Frazier Richards,                            Technician Referring MD:              Medicines:                Monitored Anesthesia Care Complications:            No immediate complications. Estimated Blood Loss:     Estimated blood loss: none. Procedure:                Pre-Anesthesia Assessment:                           - Prior to the procedure, a History and Physical                            was performed, and patient medications and                            allergies were reviewed. The patient's tolerance of                            previous anesthesia was also reviewed. The risks                            and benefits of the procedure and the sedation                            options and risks were discussed with the patient.                            All questions were answered, and informed consent                            was obtained. Prior Anticoagulants: The patient has                            taken no previous anticoagulant or antiplatelet  agents. ASA Grade Assessment: III - A patient with                            severe systemic disease. After reviewing the risks                            and benefits, the patient was deemed in                            satisfactory condition to undergo the procedure.                           After obtaining informed consent, the colonoscope                            was passed under direct  vision. Throughout the                            procedure, the patient's blood pressure, pulse, and                            oxygen saturations were monitored continuously. The                            [id number]-HQ190L ([id number]) Olympus colonoscope was                            introduced through the anus and advanced to the the                            cecum, identified by appendiceal orifice and                            ileocecal valve. The colonoscopy was performed                            without difficulty. The patient tolerated the                            procedure well. The quality of the bowel                            preparation was fair. Retained vegetable material                            specially in the right side of the colon and                            sigmoid/rectum did limit examination. Suction                            channel of the scope got clogged several times. The  ileocecal valve, appendiceal orifice, and rectum                            were photographed. Scope In: 1:05:54 PM Scope Out: 1:37:10 PM Scope Withdrawal Time: 0 hours 24 minutes 50 seconds  Total Procedure Duration: 0 hours 31 minutes 16 seconds  Findings:      Retained solid vegetable material will clog the suction channel of the       scope several times limiting examination. Gd IV prolapsed hemorrhoids,       one with red wale markings without active bleeding, were found on       perianal exam.      A 15 mm polyp was found in the distal ascending colon. The polyp was       pedunculated. The polyp was removed with a hot snare. Resection and       retrieval were complete. The polyp was retrieved by bringing scope all       the way out and reinserting the scope to the site of polypectomy.      Multiple small-mouthed diverticula were found in the sigmoid colon,       descending colon and ascending colon.      Non-bleeding external and internal hemorrhoids  were found during       retroflexion and during perianal exam. The hemorrhoids were large and       Grade IV (internal hemorrhoids that prolapse and cannot be reduced       manually).      The exam was otherwise without abnormality on direct and retroflexion       views. Impression:               - Preparation of the colon was fair. No obvious                            masses.                           - Hemorrhoids found on perianal exam.                           - One 15 mm polyp in the distal ascending colon,                            removed with a hot snare. Resected and retrieved.                           - Diverticulosis in the sigmoid colon, in the                            descending colon and in the ascending colon.                           - Non-bleeding external and internal hemorrhoids.                           - The examination was otherwise normal on direct  and retroflexion views. Moderate Sedation:      Not Applicable - Patient had care per Anesthesia. Recommendation:           - Patient has a contact number available for                            emergencies. The signs and symptoms of potential                            delayed complications were discussed with the                            patient. Return to normal activities tomorrow.                            Written discharge instructions were provided to the                            patient.                           - Resume low salt diet.                           - Continue present medications.                           - Await pathology results.                           - Repeat colonoscopy in about 3-6 months with 2 day                            prep.                           - Return to GI clinic in 4-6 weeks.                           - HC 2.5 % BID x 10 days.                           - The findings and recommendations were discussed                             with the patient's family. Procedure Code(s):        --- Professional ---                           [phone], Colonoscopy, flexible; with removal of                            tumor(s), polyp(s), or other lesion(s) by snare                            technique Diagnosis Code(s):        ---  Professional ---                           K64.3, Fourth degree hemorrhoids                           K63.5, Polyp of colon                           D50.9, Iron deficiency anemia, unspecified                           K57.30, Diverticulosis of large intestine without                            perforation or abscess without bleeding CPT copyright 2019 [employer]. All rights reserved. The codes documented in this report are preliminary and upon coder review may  be revised to meet current compliance requirements. Jackquline Denmark, MD 02/18/2022 1:57:38 PM This report has been signed electronically. Number of Addenda: 0

## 2022-02-18 NOTE — Transfer of Care (Signed)
Immediate Anesthesia Transfer of Care Note  Patient: Johnny Mcknight  Procedure(s) Performed: COLONOSCOPY WITH PROPOFOL ESOPHAGOGASTRODUODENOSCOPY (EGD) WITH PROPOFOL BIOPSY POLYPECTOMY  Patient Location: PACU  Anesthesia Type:MAC  Level of Consciousness: awake, alert  and oriented  Airway & Oxygen Therapy: Patient Spontanous Breathing and Patient connected to face mask oxygen  Post-op Assessment: Report given to RN and Post -op Vital signs reviewed and stable  Post vital signs: Reviewed and stable  Last Vitals:  Vitals Value Taken Time  BP    Temp    Pulse    Resp    SpO2      Last Pain:  Vitals:   02/18/22 1149  TempSrc: Temporal  PainSc: 0-No pain         Complications: No notable events documented.

## 2022-02-19 DIAGNOSIS — D649 Anemia, unspecified: Secondary | ICD-10-CM | POA: Diagnosis not present

## 2022-02-19 LAB — PREPARE RBC (CROSSMATCH)

## 2022-02-19 LAB — BPAM RBC
Blood Product Expiration Date: 202306192359
Blood Product Expiration Date: 202306222359
Blood Product Expiration Date: 202306242359
ISSUE DATE / TIME: 202305311435
ISSUE DATE / TIME: 202305311646
ISSUE DATE / TIME: 202305311932
Unit Type and Rh: 6200
Unit Type and Rh: 6200
Unit Type and Rh: 6200

## 2022-02-19 LAB — BASIC METABOLIC PANEL
Anion gap: 7 (ref 5–15)
BUN: 15 mg/dL (ref 8–23)
CO2: 18 mmol/L — ABNORMAL LOW (ref 22–32)
Calcium: 8 mg/dL — ABNORMAL LOW (ref 8.9–10.3)
Chloride: 111 mmol/L (ref 98–111)
Creatinine, Ser: 1.82 mg/dL — ABNORMAL HIGH (ref 0.61–1.24)
GFR, Estimated: 41 mL/min — ABNORMAL LOW (ref >60)
Glucose, Bld: 95 mg/dL (ref 70–99)
Potassium: 3.6 mmol/L (ref 3.5–5.1)
Sodium: 136 mmol/L (ref 135–145)

## 2022-02-19 LAB — TYPE AND SCREEN
ABO/RH(D): A POS
Antibody Screen: NEGATIVE
Unit division: 0
Unit division: 0
Unit division: 0

## 2022-02-19 LAB — CBC
HCT: 21.6 % — ABNORMAL LOW (ref 39.0–52.0)
Hemoglobin: 7 g/dL — ABNORMAL LOW (ref 13.0–17.0)
MCH: 27.7 pg (ref 26.0–34.0)
MCHC: 32.4 g/dL (ref 30.0–36.0)
MCV: 85.4 fL (ref 80.0–100.0)
Platelets: 137 10*3/uL — ABNORMAL LOW (ref 150–400)
RBC: 2.53 MIL/uL — ABNORMAL LOW (ref 4.22–5.81)
RDW: 15.9 % — ABNORMAL HIGH (ref 11.5–15.5)
WBC: 8.5 10*3/uL (ref 4.0–10.5)
nRBC: 0 % (ref 0.0–0.2)

## 2022-02-19 LAB — CERULOPLASMIN: Ceruloplasmin: 36.4 mg/dL — ABNORMAL HIGH (ref 16.0–31.0)

## 2022-02-19 LAB — GLUCOSE, CAPILLARY: Glucose-Capillary: 103 mg/dL — ABNORMAL HIGH (ref 70–99)

## 2022-02-19 LAB — HEMOGLOBIN AND HEMATOCRIT, BLOOD
HCT: 21.8 % — ABNORMAL LOW (ref 39.0–52.0)
Hemoglobin: 7 g/dL — ABNORMAL LOW (ref 13.0–17.0)

## 2022-02-19 LAB — ALPHA-1-ANTITRYPSIN: A-1 Antitrypsin, Ser: 196 mg/dL — ABNORMAL HIGH (ref 101–187)

## 2022-02-19 LAB — ANA: Anti Nuclear Antibody (ANA): POSITIVE — AB

## 2022-02-19 MED ORDER — SODIUM CHLORIDE 0.9% IV SOLUTION: Freq: Once | INTRAVENOUS | Status: DC

## 2022-02-19 MED ORDER — SODIUM CHLORIDE 0.9 % IV SOLN
250.0000 mg | Freq: Every day | INTRAVENOUS | Status: AC
  Administered 2022-02-19 – 2022-02-20 (×2): 250 mg via INTRAVENOUS
  Filled 2022-02-19 (×2): qty 20

## 2022-02-19 MED ORDER — FERROUS SULFATE 325 (65 FE) MG PO TABS
325.0000 mg | ORAL_TABLET | Freq: Two times a day (BID) | ORAL | Status: DC
  Administered 2022-02-19 – 2022-02-21 (×4): 325 mg via ORAL
  Filled 2022-02-19 (×4): qty 1

## 2022-02-19 NOTE — Progress Notes (Addendum)
PROGRESS NOTE    Johnny Mcknight  OVZ:[id number] DOB: 1955/11/28 DOA: 02/16/2022 PCP: Cassandria Anger, MD    Brief Narrative:  Patient is a 66 year old African-American male with past medical history significant for GERD, alcoholic polyneuropathy, streptococcal pneumonia, thiamine deficiency, vitamin D deficiency and zinc deficiency.  Patient was admitted with syncope.  On presentation, patient was found to be significantly anemic.  Patient has undergone EGD and colonoscopy.  Patient has also had a CT scan of the abdomen.  Patient is severely iron deficient.  We will proceed with IV iron today.  Patient will be discharged on oral iron.  Continue vitamin supplements.  We will repeat H/H now, as did not drop in H/H from 7.9 to 7 g/dL may be an error.  Likely discharge tomorrow.  On discharge, patient will need to follow-up with GI/hepatology team to continue to monitor patient's liver.    Assessment and Plan: Symptomatic anemia -PRBC x3. -Hemoglobin was 7 g/dL earlier today (down from 7.9 g/dL). -Repeat H/H down to be certain that above finding is correct. -Continue to monitor hematocrit and hemoglobin. -Pantoprazole 40 mg IVP every 12 hours. -Alcohol cessation strongly suggested. -Patient has undergone EGD and colonoscopy.     Alcohol dependence with other alcohol-induced disorder ([name]) Begin CIWA protocol with lorazepam. Folate, MVI and thiamine supplementation. Alcohol cessation advised. -TOC consult 03/18/2022: Patient has been on CIWA protocol.  Alcohol withdrawal was noted.     GERD Continue pantoprazole.     Hypomagnesemia -replete     HTN (hypertension) -does not appear to be on any home meds     Positive D dimer CTA chest negative.   AKI -gentle IVF -recheck in AM 02/19/2022: Suspect AKI may be secondary to hemodynamic instability/volume depletion.  DVT prophylaxis: SCDs Start: 02/16/22 1231    Code Status: Full Code Family Communication:   Disposition Plan:   Level of care: Telemetry Status is: inpt  Consultants:  GI  Subjective: No new complaints today. No chest pain. No abdominal pain. Alcohol withdrawal.  Objective: Vitals:   02/19/22 0500 02/19/22 0830 02/19/22 1148 02/19/22 1213  BP:  140/78 137/79 131/81  Pulse:  92 97 87  Resp:    18  Temp:    98.5 F (36.9 C)  TempSrc:    Oral  SpO2:    100%  Weight: 79.4 kg     Height:        Intake/Output Summary (Last 24 hours) at 02/19/2022 1440 Last data filed at 02/19/2022 0830 Gross per 24 hour  Intake 494.29 ml  Output 500 ml  Net -5.71 ml    Filed Weights   02/17/22 0500 02/18/22 0527 02/19/22 0500  Weight: 66.7 kg 74.3 kg 79.4 kg    Examination: General condition: Patient is not in any distress.  Patient is awake and alert. HEENT: Patient is pale. Neck: Supple.  No raised JVD. Lungs: Clear to auscultation. CVS: S1-S2 Abdomen: Soft and nontender. Neuro: Awake and alert.  Patient moves all extremities. Extremities: No leg edema. Skin: Chronic skin changes, likely secondary to nutrient deficiency.  Data Reviewed: I have personally reviewed following labs and imaging studies  CBC: Recent Labs  Lab 02/16/22 1033 02/17/22 0006 02/18/22 0548 02/19/22 0548  WBC 8.7 10.1 10.0 8.5  HGB 4.4* 7.8* 7.9* 7.0*  HCT 14.6* 23.9* 24.6* 21.6*  MCV 85.9 84.8 85.4 85.4  PLT 197 164 173 137*    Basic Metabolic Panel: Recent Labs  Lab 02/16/22 1033 02/17/22 0006 02/17/22 0016 02/18/22 0548 02/19/22  0548  NA 142 136  --  136 136  K 3.5 3.6  --  3.6 3.6  CL 113* 109  --  108 111  CO2 17* 18*  --  18* 18*  GLUCOSE 109* 108*  --  115* 95  BUN 16 14  --  16 15  CREATININE 1.50* 1.37*  --  1.91* 1.82*  CALCIUM 8.8* 8.3*  --  8.9 8.0*  MG 1.5*  --  1.9  --   --     GFR: Estimated Creatinine Clearance: 45.6 mL/min (A) (by C-G formula based on SCr of 1.82 mg/dL (H)). Liver Function Tests: Recent Labs  Lab 02/16/22 1033 02/17/22 0006  AST 75* 75*  ALT 18 19   ALKPHOS 192* 180*  BILITOT 1.1 3.2*  PROT 8.6* 8.0  ALBUMIN 3.0* 2.9*    No results for input(s): LIPASE, AMYLASE in the last 168 hours. No results for input(s): AMMONIA in the last 168 hours. Coagulation Profile: Recent Labs  Lab 02/16/22 1139  INR 1.2    Cardiac Enzymes: No results for input(s): CKTOTAL, CKMB, CKMBINDEX, TROPONINI in the last 168 hours. BNP (last 3 results) No results for input(s): PROBNP in the last 8760 hours. HbA1C: No results for input(s): HGBA1C in the last 72 hours. CBG: Recent Labs  Lab 02/16/22 1029 02/17/22 0510 02/18/22 0453 02/19/22 0535  GLUCAP 108* 110* 104* 103*    Lipid Profile: No results for input(s): CHOL, HDL, LDLCALC, TRIG, CHOLHDL, LDLDIRECT in the last 72 hours. Thyroid Function Tests: No results for input(s): TSH, T4TOTAL, FREET4, T3FREE, THYROIDAB in the last 72 hours. Anemia Panel: No results for input(s): VITAMINB12, FOLATE, FERRITIN, TIBC, IRON, RETICCTPCT in the last 72 hours.  Sepsis Labs: Recent Labs  Lab 02/16/22 1139 02/16/22 1305 02/17/22 0006  LATICACIDVEN 4.7* 4.1* 1.2     Recent Results (from the past 240 hour(s))  MRSA Next Gen by PCR, Nasal     Status: None   Collection Time: 02/16/22  2:26 PM   Specimen: Nasal Mucosa; Nasal Swab  Result Value Ref Range Status   MRSA by PCR Next Gen NOT DETECTED NOT DETECTED Final    Comment: (NOTE) The GeneXpert MRSA Assay (FDA approved for NASAL specimens only), is one component of a comprehensive MRSA colonization surveillance program. It is not intended to diagnose MRSA infection nor to guide or monitor treatment for MRSA infections. Test performance is not FDA approved in patients less than 85 years old. Performed at [facility], [place] [address] [place], [place] [address]           Radiology Studies: CT LIVER ABDOMEN W WO CONTRAST  Result Date: 02/18/2022 CLINICAL DATA:  A 66 year old male presenting for evaluation of suspected  hepatocellular carcinoma. EXAM: CT ABDOMEN WITHOUT AND WITH CONTRAST TECHNIQUE: Multidetector CT imaging of the abdomen was performed following the standard protocol before and following the bolus administration of intravenous contrast. RADIATION DOSE REDUCTION: This exam was performed according to the departmental dose-optimization program which includes automated exposure control, adjustment of the mA and/or kV according to patient size and/or use of iterative reconstruction technique. CONTRAST:  65m OMNIPAQUE IOHEXOL 300 MG/ML  SOLN COMPARISON:  CT angiography of the chest of Feb 16, 2022. FINDINGS: Lower chest: Lung bases are clear. No effusion. No consolidative changes. Hepatobiliary: Markedly heterogeneous appearance of the liver with areas of geographic low attenuation worse in the LEFT as compared to the RIGHT hepatic lobe with atrophy of RIGHT lobe and hypertrophy of LEFT  lobe. Recanalization of the umbilical vein, splenomegaly and portosystemic collaterals in the upper abdomen. Portal vein is patent.  Cholelithiasis with collapsed gallbladder. No definitive signs of focal, arterial enhancing lesion. No visible disruption of underlying vascular architecture in the liver. Pancreas: Normal, without mass, inflammation or ductal dilatation. Spleen: Mild splenomegaly.  No focal splenic lesion. Adrenals/Urinary Tract: Adrenal glands are normal. Renal cortical scarring bilaterally. No suspicious renal lesion or hydronephrosis. Stomach/Bowel: No acute gastrointestinal findings. Collateral pathways adjacent to the colon in the RIGHT retroperitoneum in the setting of portal hypertension. Suspect small gastric varices. Vascular/Lymphatic: Aortic atherosclerosis. No sign of aneurysm. Smooth contour of the IVC. There is no gastrohepatic or hepatoduodenal ligament lymphadenopathy. No retroperitoneal or mesenteric lymphadenopathy. No pelvic sidewall lymphadenopathy. Other: No ascites. Musculoskeletal: No acute bone  finding. No destructive bone process. Spinal degenerative changes. IMPRESSION: 1. Marked heterogeneity of the liver worse on the LEFT as compared to the RIGHT hepatic lobe favoring geographic fatty infiltration given lack of disturbance of underlying vascular architecture in the liver in this patient with signs of cirrhosis and portal hypertension. 2. While these areas lack heterogeneous arterial enhancement and disturbance of underlying vasculature given that infiltrative HCC can occasionally present with a similar appearance would suggest AFP correlation and short interval follow-up even with noncontrast MRI including in and out of phase T1 weighted gradient echo imaging for confirmation of above suspected diagnosis of fatty infiltration. This is best performed as an outpatient outside of the acute setting. If the patient is claustrophobic or otherwise intolerant could consider mild sedation in order to perform requisite sequences. 3. Findings of portal hypertension with splenomegaly, portosystemic collaterals in the upper abdomen. 4. Cholelithiasis. 5. Renal cortical scarring bilaterally. 6. Aortic atherosclerosis. Aortic Atherosclerosis (ICD10-I70.0). Electronically Signed   By: Zetta Bills M.D.   On: 02/18/2022 11:59        Scheduled Meds:  ferrous sulfate  325 mg Oral BID WC   folic acid  1 mg Oral Daily   LORazepam  0-4 mg Intravenous Q8H   mouth rinse  15 mL Mouth Rinse BID   multivitamin with minerals  1 tablet Oral Daily   pantoprazole (PROTONIX) IV  40 mg Intravenous Q12H   sodium chloride flush  3 mL Intravenous Q12H   thiamine  100 mg Oral Daily   Or   thiamine  100 mg Intravenous Daily   Continuous Infusions:  sodium chloride 100 mL/hr at 02/19/22 0217   ferric gluconate (FERRLECIT) IVPB     lactated ringers Stopped (02/18/22 1400)     LOS: 2 days    Time spent: 45 minutes spent on chart review, discussion with nursing staff, consultants, updating family and  interview/physical exam; more than 50% of that time was spent in counseling and/or coordination of care.    Bonnell Public, MD. [facility] Available via Epic secure chat 7am-7pm After these hours, please refer to coverage provider listed on [email or web address] 02/19/2022, 2:40 PM

## 2022-02-19 NOTE — TOC Progression Note (Signed)
Transition of Care [facility]) - Progression Note    Patient Details  Name: Johnny Mcknight MRN: [id number] Date of Birth: 03/06/1956  Transition of [facility] CM/SW Contact  Ross Ludwig, [place] Phone Number: 02/19/2022, 11:33 AM  Clinical Narrative:     Bedside nurse was informed that patient and family were interested in substance abuse resources.  The CSW yesterday spoke to patient and he requested that information be given to him upon discharge.  Yesterday, CSW provided information which is on the AVS for patient.  CSW also advised the nurse to tell patient to call his insurance and they can tell him which agency is in network.  Patient and family can follow up with agencies themselves.     Barriers to Discharge: No Barriers Identified  Expected Discharge Plan and Services                           DME Arranged: N/A                     Social Determinants of Health (SDOH) Interventions    Readmission Risk Interventions    02/18/2022   10:40 AM  Readmission Risk Prevention Plan  Post Dischage Appt Complete  Medication Screening Complete  Transportation Screening Complete

## 2022-02-20 ENCOUNTER — Telehealth: Payer: Self-pay | Admitting: Nurse Practitioner

## 2022-02-20 DIAGNOSIS — D649 Anemia, unspecified: Secondary | ICD-10-CM | POA: Diagnosis not present

## 2022-02-20 LAB — CBC WITH DIFFERENTIAL/PLATELET
Abs Immature Granulocytes: 0.04 10*3/uL (ref 0.00–0.07)
Basophils Absolute: 0 10*3/uL (ref 0.0–0.1)
Basophils Relative: 1 %
Eosinophils Absolute: 0.2 10*3/uL (ref 0.0–0.5)
Eosinophils Relative: 2 %
HCT: 25.1 % — ABNORMAL LOW (ref 39.0–52.0)
Hemoglobin: 8.1 g/dL — ABNORMAL LOW (ref 13.0–17.0)
Immature Granulocytes: 1 %
Lymphocytes Relative: 13 %
Lymphs Abs: 1 10*3/uL (ref 0.7–4.0)
MCH: 27.9 pg (ref 26.0–34.0)
MCHC: 32.3 g/dL (ref 30.0–36.0)
MCV: 86.6 fL (ref 80.0–100.0)
Monocytes Absolute: 0.8 10*3/uL (ref 0.1–1.0)
Monocytes Relative: 10 %
Neutro Abs: 5.8 10*3/uL (ref 1.7–7.7)
Neutrophils Relative %: 73 %
Platelets: 146 10*3/uL — ABNORMAL LOW (ref 150–400)
RBC: 2.9 MIL/uL — ABNORMAL LOW (ref 4.22–5.81)
RDW: 15.8 % — ABNORMAL HIGH (ref 11.5–15.5)
WBC: 7.8 10*3/uL (ref 4.0–10.5)
nRBC: 0 % (ref 0.0–0.2)

## 2022-02-20 LAB — RENAL FUNCTION PANEL
Albumin: 2.7 g/dL — ABNORMAL LOW (ref 3.5–5.0)
Anion gap: 6 (ref 5–15)
BUN: 12 mg/dL (ref 8–23)
CO2: 19 mmol/L — ABNORMAL LOW (ref 22–32)
Calcium: 8.3 mg/dL — ABNORMAL LOW (ref 8.9–10.3)
Chloride: 113 mmol/L — ABNORMAL HIGH (ref 98–111)
Creatinine, Ser: 1.59 mg/dL — ABNORMAL HIGH (ref 0.61–1.24)
GFR, Estimated: 48 mL/min — ABNORMAL LOW (ref >60)
Glucose, Bld: 103 mg/dL — ABNORMAL HIGH (ref 70–99)
Phosphorus: 3.1 mg/dL (ref 2.5–4.6)
Potassium: 3.6 mmol/L (ref 3.5–5.1)
Sodium: 138 mmol/L (ref 135–145)

## 2022-02-20 LAB — GLUCOSE, CAPILLARY: Glucose-Capillary: 106 mg/dL — ABNORMAL HIGH (ref 70–99)

## 2022-02-20 MED ORDER — PANTOPRAZOLE SODIUM 40 MG PO TBEC: 40.0000 mg | DELAYED_RELEASE_TABLET | Freq: Every day | ORAL | 1 refills | Status: DC

## 2022-02-20 MED ORDER — PANTOPRAZOLE SODIUM 40 MG PO TBEC
40.0000 mg | DELAYED_RELEASE_TABLET | Freq: Every day | ORAL | Status: DC
  Filled 2022-02-20: qty 1

## 2022-02-20 MED ORDER — ADULT MULTIVITAMIN W/MINERALS CH: 1.0000 | ORAL_TABLET | Freq: Every day | ORAL | 1 refills | Status: AC

## 2022-02-20 MED ORDER — THIAMINE HCL 100 MG PO TABS: 100.0000 mg | ORAL_TABLET | Freq: Every day | ORAL | 1 refills | Status: AC

## 2022-02-20 MED ORDER — FOLIC ACID 1 MG PO TABS: 1.0000 mg | ORAL_TABLET | Freq: Every day | ORAL | 1 refills | Status: AC

## 2022-02-20 MED ORDER — HYDROCORTISONE (PERIANAL) 2.5 % EX CREA
TOPICAL_CREAM | Freq: Two times a day (BID) | CUTANEOUS | Status: DC
Start: 2022-02-20 — End: 2022-02-21
  Filled 2022-02-20: qty 28.35

## 2022-02-20 MED ORDER — ZINC SULFATE 50 MG PO CAPS: 220.0000 mg | ORAL_CAPSULE | Freq: Two times a day (BID) | ORAL | 1 refills | Status: AC

## 2022-02-20 MED ORDER — HYDROCORTISONE (PERIANAL) 2.5 % EX CREA: TOPICAL_CREAM | Freq: Two times a day (BID) | CUTANEOUS | 0 refills | Status: AC

## 2022-02-20 MED ORDER — FERROUS SULFATE 325 (65 FE) MG PO TABS: 325.0000 mg | ORAL_TABLET | Freq: Two times a day (BID) | ORAL | 1 refills | Status: DC

## 2022-02-20 NOTE — Telephone Encounter (Signed)
Dr. Lyndel Safe, patient will be discharged home from [facility] today.   Remo Lipps, pls contact patient and send him to our lab for a CBC, CMP in one week. Pls schedule him for a follow up appointment with Dr. Lyndel Safe or Amy Esterwood in 2 to 3 weeks. THX!

## 2022-02-20 NOTE — Discharge Summary (Signed)
Physician Discharge Summary   Patient: Johnny Mcknight MRN: [id number] DOB: 01-10-1956  Admit date:     02/16/2022  Discharge date: 02/20/22  Discharge Physician: Bonnell Public   PCP: Cassandria Anger, MD   Recommendations at discharge:    Follow up with PCP within one week of discharge.  Discharge Diagnoses: Principal Problem:   Symptomatic anemia Active Problems:   Alcohol dependence with other alcohol-induced disorder (HCC)   GERD   Hypomagnesemia   HTN (hypertension)   Hyperproteinemia   Positive D dimer   Hypoalbuminemia   Skin rash   Anemia  Resolved Problems:   * No resolved hospital problems. [facility] Course: Patient is a 66 year old African-American male with past medical history significant for GERD, alcohol abuse, chronic liver disease, alcoholic polyneuropathy, streptococcal pneumonia, thiamine deficiency, vitamin D deficiency and zinc deficiency.  Patient was admitted with syncope.  On presentation to the hospital, patient was found to be significantly anemic, with hemoglobin of 4.4 g/dL.  Patient was admitted for further assessment and management.  Patient has been transfused with total of 4 units of packed red blood cells.  GI team was consulted to assist with patient's management.  Patient has undergone EGD and colonoscopy.  Patient has also had a CT scan of the abdomen.  Patient is severely iron deficient.  Patient has received IV iron.  Patient will be discharged on oral iron.  We will continue vitamin supplements.  We will proceed with IV iron today.  Patient will be discharged on oral iron.  Continue vitamin supplements.  On discharge, patient will need to follow-up with GI/hepatology team to continue to monitor patient's liver.    Assessment and Plan: Symptomatic anemia -PRBC x 4. -Hemoglobin is 8.1 g/dL today. -Patient has received IV iron. -Patient will be discharged on oral iron. -Patient has been advised to stop alcohol abuse. -CT scan of the  abdomen and pelvis revealed liver cirrhosis, fatty liver and evidence of portal hypertension. -Patient will follow with GI team on discharge. -Continue PPI on discharge. -Patient underwent EGD during the hospital stay.   Alcohol dependence with other alcohol-induced disorder ([place]) -Patient was managed with CIWA protocol. -No significant alcohol withdrawal symptoms noted during the hospital stay. -Continue folate, MVI and thiamine supplementation. -Alcohol cessation advised.     GERD Continue pantoprazole.     Hypomagnesemia -Continue to monitor and replete.  Replete     HTN (hypertension) -Monitor and optimize. -Goal blood pressure should be less than 130/80 mmHg.       Positive D dimer CTA chest negative.   AKI -Resolved. --Suspect AKI may have been secondary to hemodynamic instability/volume depletion.        Consultants: GI Procedures performed:  Colonoscopy: - Preparation of the colon was fair. No obvious masses. - Hemorrhoids found on perianal exam. - One 15 mm polyp in the distal ascending colon, removed with a hot snare. Resected and retrieved. - Diverticulosis in the sigmoid colon, in the descending colon and in the ascending colon. - Non-bleeding external and internal hemorrhoids. - The examination was otherwise normal on direct and retroflexion views.  Upper GI scope: - Grade 0-I esophageal varices. Too small for EVL - Gastritis. Biopsied. - Normal examined duodenum. Biopsied.  Disposition: Home Diet recommendation: Cardiac/renal Discharge Diet Orders (From admission, onward)     Start     Ordered   02/20/22 0000  Diet - low sodium heart healthy        02/20/22 1532  Renal diet DISCHARGE MEDICATION: Allergies as of 02/20/2022       Reactions   Doxycycline    REACTION: sleepy   Omeprazole-sodium Bicarbonate    REACTION: diarrhea   Propranolol    tired        Medication List     STOP taking these medications    diclofenac  Sodium 1 % Gel Commonly known as: Voltaren   famotidine 40 MG tablet Commonly known as: Pepcid   ibuprofen 200 MG tablet Commonly known as: ADVIL   Magnesium 500 MG Tabs   promethazine 25 MG tablet Commonly known as: PHENERGAN       TAKE these medications    ferrous sulfate 325 (65 FE) MG tablet Take 1 tablet (325 mg total) by mouth 2 (two) times daily with a meal.   folic acid 1 MG tablet Commonly known as: FOLVITE Take 1 tablet (1 mg total) by mouth daily. Start taking on: February 21, 2022   hydrocortisone 2.5 % rectal cream Commonly known as: ANUSOL-HC Place rectally 2 (two) times daily.   multivitamin with minerals Tabs tablet Take 1 tablet by mouth daily. Start taking on: February 21, 2022   pantoprazole 40 MG tablet Commonly known as: PROTONIX Take 1 tablet (40 mg total) by mouth daily. Start taking on: February 21, 2022   thiamine 100 MG tablet Take 1 tablet (100 mg total) by mouth daily.   zinc sulfate 50 MG Caps capsule Take 1 capsule (220 mg total) by mouth 2 (two) times daily.        Discharge Exam: Filed Weights   02/18/22 0527 02/19/22 0500 02/20/22 0500  Weight: 74.3 kg 79.4 kg 79.2 kg     Condition at discharge: stable  The results of significant diagnostics from this hospitalization (including imaging, microbiology, ancillary and laboratory) are listed below for reference.   Imaging Studies: DG Chest 2 View  Result Date: 02/16/2022 CLINICAL DATA:  Presyncope.  Shortness of breath. EXAM: CHEST - 2 VIEW COMPARISON:  11/16/2015 FINDINGS: Lateral view degraded by patient arm position. Numerous leads and wires project over the chest. Midline trachea. Normal heart size. No pleural effusion or pneumothorax. Clear lungs. IMPRESSION: No active cardiopulmonary disease. Electronically Signed   By: Abigail Miyamoto M.D.   On: 02/16/2022 11:31   CT Head Wo Contrast  Result Date: 02/16/2022 CLINICAL DATA:  Syncope loss of consciousness EXAM: CT HEAD WITHOUT  CONTRAST CT CERVICAL SPINE WITHOUT CONTRAST TECHNIQUE: Multidetector CT imaging of the head and cervical spine was performed following the standard protocol without intravenous contrast. Multiplanar CT image reconstructions of the cervical spine were also generated. RADIATION DOSE REDUCTION: This exam was performed according to the departmental dose-optimization program which includes automated exposure control, adjustment of the mA and/or kV according to patient size and/or use of iterative reconstruction technique. COMPARISON:  CT head and cervical spine 09/06/2009 FINDINGS: CT HEAD FINDINGS Brain: There is no acute intracranial hemorrhage, extra-axial fluid collection, or acute infarct. Parenchymal volume is normal for age. The ventricles are normal in size. Gray-white differentiation is preserved. There is no mass lesion.  There is no mass effect or midline shift. Vascular: No hyperdense vessel or unexpected calcification. Skull: Normal. Negative for fracture or focal lesion. Sinuses/Orbits: The imaged paranasal sinuses are clear. The globes and orbits are unremarkable. Other: None. CT CERVICAL SPINE FINDINGS Alignment: Normal. Skull base and vertebrae: Skull base alignment is normal. Vertebral body heights are preserved. There is no evidence of acute fracture. There is no suspicious osseous  lesion. Soft tissues and spinal canal: No prevertebral fluid or swelling. No visible canal hematoma. 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 There is mild multilevel degenerative endplate change and facet arthropathy resulting in up to moderate to severe bilateral neural foraminal stenosis C4-C5 and C5-C6. Upper chest: The imaged lung apices are clear. Other: None. IMPRESSION: 1. No acute intracranial pathology. 2. No acute fracture or traumatic malalignment of the cervical spine. 3. Multilevel degenerative changes and  ossification of the posterior longitudinal ligament as above. Electronically Signed   By: Valetta Mole M.D.   On: 02/16/2022 11:49   CT Angio Chest PE W and/or Wo Contrast  Result Date: 02/16/2022 CLINICAL DATA:  Positive D-dimer, clinical suspicion for pulmonary embolism EXAM: CT ANGIOGRAPHY CHEST WITH CONTRAST TECHNIQUE: Multidetector CT imaging of the chest was performed using the standard protocol during bolus administration of intravenous contrast. Multiplanar CT image reconstructions and MIPs were obtained to evaluate the vascular anatomy. RADIATION DOSE REDUCTION: This exam was performed according to the departmental dose-optimization program which includes automated exposure control, adjustment of the mA and/or kV according to patient size and/or use of iterative reconstruction technique. CONTRAST:  57m OMNIPAQUE IOHEXOL 350 MG/ML SOLN COMPARISON:  Chest radiograph done earlier today FINDINGS: Cardiovascular: There is homogeneous enhancement in thoracic aorta. Coronary artery calcifications are seen. There are no filling defects in the central pulmonary artery branches. Motion artifacts limit evaluation of small peripheral branches especially in the lower lung fields. Mediastinum/Nodes: Unremarkable. Lungs/Pleura: There is no focal pulmonary consolidation. Small linear density in the lateral aspect of right upper lobe may suggest minimal scarring or subsegmental atelectasis. There is no pleural effusion or pneumothorax. Upper Abdomen: Liver is enlarged in size. There is nodularity in the liver surface. There are possible faint low-density space-occupying lesions in the liver. This finding is not fully evaluated. Follow-up multiphasic CT or MRI should be considered. Musculoskeletal: Bony structures are unremarkable. Gynecomastia is seen in both breasts. Review of the MIP images confirms the above findings. IMPRESSION: There is no evidence of central pulmonary artery embolism. There is no evidence of  thoracic aortic dissection. Coronary artery calcifications are seen. Nodularity in the liver surface suggests possible cirrhosis. There are faint ill-defined low-density foci in the liver. Possibility of neoplastic process in the liver is not excluded. Follow-up multiphasic CT or MRI should be considered. Electronically Signed   By: PElmer PickerM.D.   On: 02/16/2022 13:14   CT Cervical Spine Wo Contrast  Result Date: 02/16/2022 CLINICAL DATA:  Syncope loss of consciousness EXAM: CT HEAD WITHOUT CONTRAST CT CERVICAL SPINE WITHOUT CONTRAST TECHNIQUE: Multidetector CT imaging of the head and cervical spine was performed following the standard protocol without intravenous contrast. Multiplanar CT image reconstructions of the cervical spine were also generated. RADIATION DOSE REDUCTION: This exam was performed according to the departmental dose-optimization program which includes automated exposure control, adjustment of the mA and/or kV according to patient size and/or use of iterative reconstruction technique. COMPARISON:  CT head and cervical spine 09/06/2009 FINDINGS: CT HEAD FINDINGS Brain: There is no acute intracranial hemorrhage, extra-axial fluid collection, or acute infarct. Parenchymal volume is normal for age. The ventricles are normal in size. Gray-white differentiation is preserved. There is no mass lesion.  There is no mass effect or midline shift. Vascular: No hyperdense vessel or unexpected calcification. Skull: Normal. Negative for fracture or focal lesion. Sinuses/Orbits: The imaged paranasal sinuses are clear.  The globes and orbits are unremarkable. Other: None. CT CERVICAL SPINE FINDINGS Alignment: Normal. Skull base and vertebrae: Skull base alignment is normal. Vertebral body heights are preserved. There is no evidence of acute fracture. There is no suspicious osseous lesion. Soft tissues and spinal canal: No prevertebral fluid or swelling. No visible canal hematoma. 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 There is mild multilevel degenerative endplate change and facet arthropathy resulting in up to moderate to severe bilateral neural foraminal stenosis C4-C5 and C5-C6. Upper chest: The imaged lung apices are clear. Other: None. IMPRESSION: 1. No acute intracranial pathology. 2. No acute fracture or traumatic malalignment of the cervical spine. 3. 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 Electronically Signed   By: Valetta Mole M.D.   On: 02/16/2022 11:49   ECHOCARDIOGRAM COMPLETE  Result Date: 02/16/2022    ECHOCARDIOGRAM REPORT   Patient Name:   ROMARIO TITH Date of Exam: 02/16/2022 Medical Rec #:  [id number]  Height:       72.0 in Accession #:    [id number] Weight:       166.0 lb Date of Birth:  [birth year]  BSA:          1.968 m Patient Age:    68 years   BP:           155/85 mmHg Patient Gender: M          HR:           96 bpm. Exam Location:  Inpatient Procedure: 2D Echo, 3D Echo, Cardiac Doppler and Color Doppler Indications:    R55 Syncope  History:        Patient has prior history of Echocardiogram examinations, most                 recent 09/25/2015. Abnormal ECG, COPD, Signs/Symptoms:Bacteremia                 and Dyspnea; Risk Factors:Hypertension. ETOH.  Sonographer:    Roseanna Rainbow RDCS Referring Phys: [id number] [place]  Sonographer Comments: Study paused for patient care. IMPRESSIONS  1. Left ventricular ejection fraction, by estimation, is 70 to 75%. The left ventricle has hyperdynamic function. The left ventricle has no regional wall motion abnormalities. There is mild left ventricular hypertrophy. Left ventricular diastolic parameters are consistent with Grade I diastolic dysfunction (impaired relaxation).  2. Right ventricular systolic function is normal. The right ventricular size is normal. There is normal pulmonary  artery systolic pressure.  3. Left atrial size was moderately dilated.  4. The mitral valve is normal in structure. Trivial mitral valve regurgitation. No evidence of mitral stenosis.  5. The aortic valve is tricuspid. Aortic valve regurgitation is not visualized. No aortic stenosis is present.  6. The inferior vena cava is normal in size with greater than 50% respiratory variability, suggesting right atrial pressure of 3 mmHg. FINDINGS  Left Ventricle: Left ventricular ejection fraction, by estimation, is 70 to 75%. The left ventricle has hyperdynamic function. The left ventricle has no regional wall motion abnormalities. The left ventricular internal cavity size was normal in size. There is mild left ventricular hypertrophy. Left ventricular diastolic parameters are consistent with Grade I diastolic dysfunction (impaired relaxation). Indeterminate filling pressures. Right Ventricle: The right ventricular size is normal. No increase in right ventricular wall thickness. Right ventricular systolic function is normal. There is normal pulmonary  artery systolic pressure. The tricuspid regurgitant velocity is 1.74 m/s, and  with an assumed right atrial pressure of 3 mmHg, the estimated right ventricular systolic pressure is 48.8 mmHg. Left Atrium: Left atrial size was moderately dilated. Right Atrium: Right atrial size was normal in size. Pericardium: There is no evidence of pericardial effusion. Mitral Valve: The mitral valve is normal in structure. Trivial mitral valve regurgitation. No evidence of mitral valve stenosis. Tricuspid Valve: The tricuspid valve is normal in structure. Tricuspid valve regurgitation is trivial. No evidence of tricuspid stenosis. Aortic Valve: The aortic valve is tricuspid. Aortic valve regurgitation is not visualized. No aortic stenosis is present. Pulmonic Valve: The pulmonic valve was normal in structure. Pulmonic valve regurgitation is not visualized. No evidence of pulmonic stenosis.  Aorta: The aortic root is normal in size and structure. Venous: The inferior vena cava is normal in size with greater than 50% respiratory variability, suggesting right atrial pressure of 3 mmHg. IAS/Shunts: No atrial level shunt detected by color flow Doppler.  LEFT VENTRICLE PLAX 2D LVIDd:         4.50 cm     Diastology LVIDs:         2.70 cm     LV e' medial:    7.62 cm/s LV PW:         1.00 cm     LV E/e' medial:  12.9 LV IVS:        1.10 cm     LV e' lateral:   14.60 cm/s LVOT diam:     2.20 cm     LV E/e' lateral: 6.7 LV SV:         118 LV SV Index:   60 LVOT Area:     3.80 cm                             3D Volume EF: LV Volumes (MOD)           3D EF:        62 % LV vol d, MOD A2C: 66.0 ml LV EDV:       136 ml LV vol d, MOD A4C: 78.0 ml LV ESV:       52 ml LV vol s, MOD A2C: 22.2 ml LV SV:        85 ml LV vol s, MOD A4C: 27.7 ml LV SV MOD A2C:     43.8 ml LV SV MOD A4C:     78.0 ml LV SV MOD BP:      47.6 ml RIGHT VENTRICLE             IVC RV S prime:     10.20 cm/s  IVC diam: 1.80 cm TAPSE (M-mode): 2.4 cm LEFT ATRIUM             Index        RIGHT ATRIUM           Index LA diam:        2.70 cm 1.37 cm/m   RA Area:     15.60 cm LA Vol (A2C):   76.4 ml 38.81 ml/m  RA Volume:   34.90 ml  17.73 ml/m LA Vol (A4C):   53.7 ml 27.28 ml/m LA Biplane Vol: 65.6 ml 33.33 ml/m  AORTIC VALVE LVOT Vmax:   177.00 cm/s LVOT Vmean:  118.000 cm/s LVOT VTI:    0.310 m  AORTA Ao Root diam: 3.20  cm Ao Asc diam:  2.80 cm MITRAL VALVE                TRICUSPID VALVE MV Area (PHT): 5.02 cm     TR Peak grad:   12.1 mmHg MV Decel Time: 151 msec     TR Vmax:        174.00 cm/s MV E velocity: 98.50 cm/s MV A velocity: 114.00 cm/s  SHUNTS MV E/A ratio:  0.86         Systemic VTI:  0.31 m                             Systemic Diam: 2.20 cm Skeet Latch MD Electronically signed by Skeet Latch MD Signature Date/Time: 02/16/2022/5:10:58 PM    Final    CT LIVER ABDOMEN W WO CONTRAST  Result Date: 02/18/2022 CLINICAL DATA:  A  66 year old male presenting for evaluation of suspected hepatocellular carcinoma. EXAM: CT ABDOMEN WITHOUT AND WITH CONTRAST TECHNIQUE: Multidetector CT imaging of the abdomen was performed following the standard protocol before and following the bolus administration of intravenous contrast. RADIATION DOSE REDUCTION: This exam was performed according to the departmental dose-optimization program which includes automated exposure control, adjustment of the mA and/or kV according to patient size and/or use of iterative reconstruction technique. CONTRAST:  31m OMNIPAQUE IOHEXOL 300 MG/ML  SOLN COMPARISON:  CT angiography of the chest of Feb 16, 2022. FINDINGS: Lower chest: Lung bases are clear. No effusion. No consolidative changes. Hepatobiliary: Markedly heterogeneous appearance of the liver with areas of geographic low attenuation worse in the LEFT as compared to the RIGHT hepatic lobe with atrophy of RIGHT lobe and hypertrophy of LEFT lobe. Recanalization of the umbilical vein, splenomegaly and portosystemic collaterals in the upper abdomen. Portal vein is patent.  Cholelithiasis with collapsed gallbladder. No definitive signs of focal, arterial enhancing lesion. No visible disruption of underlying vascular architecture in the liver. Pancreas: Normal, without mass, inflammation or ductal dilatation. Spleen: Mild splenomegaly.  No focal splenic lesion. Adrenals/Urinary Tract: Adrenal glands are normal. Renal cortical scarring bilaterally. No suspicious renal lesion or hydronephrosis. Stomach/Bowel: No acute gastrointestinal findings. Collateral pathways adjacent to the colon in the RIGHT retroperitoneum in the setting of portal hypertension. Suspect small gastric varices. Vascular/Lymphatic: Aortic atherosclerosis. No sign of aneurysm. Smooth contour of the IVC. There is no gastrohepatic or hepatoduodenal ligament lymphadenopathy. No retroperitoneal or mesenteric lymphadenopathy. No pelvic sidewall lymphadenopathy.  Other: No ascites. Musculoskeletal: No acute bone finding. No destructive bone process. Spinal degenerative changes. IMPRESSION: 1. Marked heterogeneity of the liver worse on the LEFT as compared to the RIGHT hepatic lobe favoring geographic fatty infiltration given lack of disturbance of underlying vascular architecture in the liver in this patient with signs of cirrhosis and portal hypertension. 2. While these areas lack heterogeneous arterial enhancement and disturbance of underlying vasculature given that infiltrative HCC can occasionally present with a similar appearance would suggest AFP correlation and short interval follow-up even with noncontrast MRI including in and out of phase T1 weighted gradient echo imaging for confirmation of above suspected diagnosis of fatty infiltration. This is best performed as an outpatient outside of the acute setting. If the patient is claustrophobic or otherwise intolerant could consider mild sedation in order to perform requisite sequences. 3. Findings of portal hypertension with splenomegaly, portosystemic collaterals in the upper abdomen. 4. Cholelithiasis. 5. Renal cortical scarring bilaterally. 6. Aortic atherosclerosis. Aortic Atherosclerosis (ICD10-I70.0). Electronically Signed   By: GCay Schillings  Wile M.D.   On: 02/18/2022 11:59    Microbiology: Results for orders placed or performed during the hospital encounter of 02/16/22  MRSA Next Gen by PCR, Nasal     Status: None   Collection Time: 02/16/22  2:26 PM   Specimen: Nasal Mucosa; Nasal Swab  Result Value Ref Range Status   MRSA by PCR Next Gen NOT DETECTED NOT DETECTED Final    Comment: (NOTE) The GeneXpert MRSA Assay (FDA approved for NASAL specimens only), is one component of a comprehensive MRSA colonization surveillance program. It is not intended to diagnose MRSA infection nor to guide or monitor treatment for MRSA infections. Test performance is not FDA approved in patients less than 23  years old. Performed at [facility], [place] [address] [place], [place] [address]     Labs: CBC: Recent Labs  Lab 02/16/22 1033 02/17/22 0006 02/18/22 0548 02/19/22 0548 02/19/22 1458 02/20/22 0536  WBC 8.7 10.1 10.0 8.5  --  7.8  NEUTROABS  --   --   --   --   --  5.8  HGB 4.4* 7.8* 7.9* 7.0* 7.0* 8.1*  HCT 14.6* 23.9* 24.6* 21.6* 21.8* 25.1*  MCV 85.9 84.8 85.4 85.4  --  86.6  PLT 197 164 173 137*  --  867*   Basic Metabolic Panel: Recent Labs  Lab 02/16/22 1033 02/17/22 0006 02/17/22 0016 02/18/22 0548 02/19/22 0548 02/20/22 0536  NA 142 136  --  136 136 138  K 3.5 3.6  --  3.6 3.6 3.6  CL 113* 109  --  108 111 113*  CO2 17* 18*  --  18* 18* 19*  GLUCOSE 109* 108*  --  115* 95 103*  BUN 16 14  --  '16 15 12  '$ CREATININE 1.50* 1.37*  --  1.91* 1.82* 1.59*  CALCIUM 8.8* 8.3*  --  8.9 8.0* 8.3*  MG 1.5*  --  1.9  --   --   --   PHOS  --   --   --   --   --  3.1   Liver Function Tests: Recent Labs  Lab 02/16/22 1033 02/17/22 0006 02/20/22 0536  AST 75* 75*  --   ALT 18 19  --   ALKPHOS 192* 180*  --   BILITOT 1.1 3.2*  --   PROT 8.6* 8.0  --   ALBUMIN 3.0* 2.9* 2.7*   CBG: Recent Labs  Lab 02/16/22 1029 02/17/22 0510 02/18/22 0453 02/19/22 0535 02/20/22 0522  GLUCAP 108* 110* 104* 103* 106*    Discharge time spent: greater than 30 minutes.  Signed: Bonnell Public, MD [facility] 02/20/2022

## 2022-02-20 NOTE — Progress Notes (Signed)
Patient's family is in the room and they are questioning discharge. Family states the patient has had several falls in the recent past and would like for physical therapy to evaluate the patient before he goes home. MD put in PT and OT orders to be re-evaluated. Staff is in agreement with family as patient is visibly wobbly when standing in place.

## 2022-02-21 ENCOUNTER — Other Ambulatory Visit: Payer: Self-pay

## 2022-02-21 DIAGNOSIS — D649 Anemia, unspecified: Secondary | ICD-10-CM

## 2022-02-21 LAB — SURGICAL PATHOLOGY

## 2022-02-21 LAB — TYPE AND SCREEN
ABO/RH(D): A POS
Antibody Screen: NEGATIVE
Unit division: 0

## 2022-02-21 LAB — BPAM RBC
Blood Product Expiration Date: 202306262359
ISSUE DATE / TIME: 202306040019
Unit Type and Rh: 6200

## 2022-02-21 NOTE — Telephone Encounter (Signed)
Patient has been scheduled for hospital f/u with Dr. Lyndel Safe on 03/15/22 at 9:30 am. Labs ordered. Will call patient once he has been discharged from hospital later today.

## 2022-02-21 NOTE — Progress Notes (Addendum)
PHYSICAL THERAPY EVALUATION  CLINICAL IMPRESSION Pt presenting with problems and functional impairments listed below. Patient reports independence with mobility at baseline but does endorse polyneuropathy in all extremities; educated pt on the importance of strength and balance training to reduce fall incidence, pt convinced his falls are only related to his anemia. Patient now requires min guard for transfers and gait with RW. Patient was able to ambulate 120 feet with RW and min guard assist; educated pt on proper RW technique and to use one post-discharge, pt agreeable. Pt completed stair training with one rail and min guard and demonstrated safe technique, educated pt on using pacing and step-to pattern while managing stairs at home. Patient instructed in exercises to improve BLE strength and balance, pt agreeable. Patient will benefit from continued acute PT interventions to address impairments and progress towards PLOF. Given functional impairments, level of assist at home, and issues with transportation recommending HHPT at this time. Acute PT will follow to progress mobility and stair training in preparation for safe discharge.    02/21/22 1007  PT Visit Information  Last PT Received On 02/21/22  Assistance Needed +1  History of Present Illness Pt is a 66yo male presenting to [facility] ED on 5/31 after having a syncopal episode, third in two weeks. HgB 4.4 received 4 units of PRBC. Found to have symptomatic anemia, ETOH dependence. s/p EGD and colonoscopy, found to have hemorrhoids and diverticulosis. CT abdomen revelead cirrhosis of the liver, other imaging negative for acute changes.  PMH: GERD, HTN, alcoholic polyneuropathy, thiamine/vitaminD/Zinc deficiency  Precautions  Precautions Fall  Precaution Comments 3 falls in the past two weeks  Restrictions  Weight Bearing Restrictions No  Home Living  Family/patient expects to be discharged to: Private residence  Living Arrangements Other  relatives (Two sisters)  Available Help at Discharge Available PRN/intermittently;Family (Sisters work during the day)  Type of [employer] Access Level entry  [place] to live on main level with bedroom/bathroom;Two level  Alternate Level Stairs-Number of Steps 7  Alternate Level Stairs-Rails Left  Bathroom Shower/Tub Walk-in shower  Bathroom Toilet Handicapped height  Home Equipment None  Prior Function  Prior Level of Function  Independent/Modified Independent;History of Falls (last six months) (Pt is not driving, per sister does not leave house)  Mobility Comments ind  ADLs Comments ind  Communication  Communication No difficulties  Pain Assessment  Pain Assessment No/denies pain  Cognition  Arousal/Alertness Awake/alert  Behavior During Therapy WFL for tasks assessed/performed  Overall Cognitive Status Within Functional Limits for tasks assessed  Upper Extremity Assessment  Upper Extremity Assessment Defer to OT evaluation;Generalized weakness (Pt's b/l shoulder flexion limited to ~90deg, pt reports polyneuropathy in hands as well)  Lower Extremity Assessment  Lower Extremity Assessment RLE deficits/detail;LLE deficits/detail;Generalized weakness  RLE Deficits / Details Gross MMT 4/5, ankle DF ROM reduced to neutral  RLE Sensation decreased proprioception;history of peripheral neuropathy;decreased light touch  LLE Deficits / Details Gross MMT 4/5, ankle DF ROM reduced to neutral  LLE Sensation decreased light touch;decreased proprioception;history of peripheral neuropathy  Cervical / Trunk Assessment  Cervical / Trunk Assessment Normal  Bed Mobility  General bed mobility comments Pt seated in recliner at entry and exit  Transfers  Overall transfer level Needs assistance  Equipment used Rolling walker (2 wheels)  Transfers Sit to/from Stand  Sit to Stand Supervision  General transfer comment Pt supervision for safety only, no physical assist required.   Ambulation/Gait  Ambulation/Gait assistance Min guard  Gait  Distance (Feet) 120 Feet  Assistive device Rolling walker (2 wheels)  Gait Pattern/deviations Step-through pattern;Knees buckling;Drifts right/left  General Gait Details Pt ambulatd with min guard assist, no physical assist required. One overt LOB posterior while turning to the R but pt was able to self correct without PT assistance. Demonstrated L knee buckling occasionally and R leg in ER and some genu varum. Pt drifting right and had to correct RW, picked it up to avoid obstacles, VCs to simply redirect AD. Pt demonstrated increased stability and security in RW at end of ambulation task. Recommended RW to pt, pt amenable.  Gait velocity decreased  Stairs Yes  Stairs assistance Min guard  Stair Management One rail Right;Step to pattern;Forwards  Number of Stairs 2  General stair comments Pt educated on safe stair mobility with one rail, step-to pattern, verbalized understanding. Demonstrated safe stair technique with VCs, encouraged pt to take it slow and use the railing always. No overt LOB.  Balance  Overall balance assessment Needs assistance  Sitting-balance support Feet supported;No upper extremity supported  Sitting balance-Leahy Scale Fair  Standing balance support Bilateral upper extremity supported;During functional activity;Reliant on assistive device for balance  Standing balance-Leahy Scale Poor  PT - End of Session  Equipment Utilized During Treatment Gait belt  Activity Tolerance Patient tolerated treatment well;No increased pain  Patient left in chair;with call bell/phone within reach  Nurse Communication Mobility status  PT Assessment  PT Recommendation/Assessment Patient needs continued PT services  PT Visit Diagnosis Unsteadiness on feet (R26.81);History of falling (Z91.81);Muscle weakness (generalized) (M62.81);Difficulty in walking, not elsewhere classified (R26.2)  PT Problem List Decreased  strength;Decreased range of motion;Decreased activity tolerance;Decreased balance;Decreased mobility;Decreased coordination;Decreased knowledge of use of DME;Pain  PT Plan  PT Frequency (ACUTE ONLY) Min 3X/week  PT Treatment/Interventions (ACUTE ONLY) DME instruction;Gait training;Stair training;Functional mobility training;Therapeutic activities;Therapeutic exercise;Balance training;Neuromuscular re-education;Patient/family education  AM-PAC PT "6 Clicks" Mobility Outcome Measure (Version 2)  Help needed turning from your back to your side while in a flat bed without using bedrails? 3  Help needed moving from lying on your back to sitting on the side of a flat bed without using bedrails? 4  Help needed moving to and from a bed to a chair (including a wheelchair)? 3  Help needed standing up from a chair using your arms (e.g., wheelchair or bedside chair)? 3  Help needed to walk in hospital room? 3  Help needed climbing 3-5 steps with a railing?  3  6 Click Score 19  Consider Recommendation of Discharge To: Home with [facility]  Progressive Mobility  What is the highest level of mobility based on the progressive mobility assessment? Level 5 (Walks with assist in room/hall) - Balance while stepping forward/back and can walk in room with assist - Complete  Activity Ambulated with assistance in hallway  PT Recommendation  Follow Up Recommendations Home health PT  Assistance recommended at discharge Intermittent Supervision/Assistance  Patient can return home with the following A little help with walking and/or transfers;A little help with bathing/dressing/bathroom;Assistance with cooking/housework;Assist for transportation;Help with stairs or ramp for entrance  Functional Status Assessment Patient has had a recent decline in their functional status and demonstrates the ability to make significant improvements in function in a reasonable and predictable amount of time.  PT equipment Rolling walker (2 wheels)   Individuals Consulted  Consulted and Agree with Results and Recommendations Patient  Acute Rehab PT Goals  Patient Stated Goal To walk without falling  PT Goal Formulation With patient  Time For Goal Achievement 03/07/22  Potential to Achieve Goals Good  PT Time Calculation  PT Start Time (ACUTE ONLY) 0950  PT Stop Time (ACUTE ONLY) 1016  PT Time Calculation (min) (ACUTE ONLY) 26 min  PT General Charges  $$ ACUTE PT VISIT 1 Visit  PT Evaluation  $PT Eval Low Complexity 1 Low  PT Treatments  $Gait Training 8-22 mins  Written Expression  Dominant Hand Right   Coolidge Breeze, PT, DPT [facility] Department Office: [phone] Pager: [phone]

## 2022-02-21 NOTE — Anesthesia Postprocedure Evaluation (Signed)
Anesthesia Post Note  Patient: Johnny Mcknight  Procedure(s) Performed: COLONOSCOPY WITH PROPOFOL ESOPHAGOGASTRODUODENOSCOPY (EGD) WITH PROPOFOL BIOPSY POLYPECTOMY     Patient location during evaluation: PACU Anesthesia Type: MAC Level of consciousness: awake and alert Pain management: pain level controlled Vital Signs Assessment: post-procedure vital signs reviewed and stable Respiratory status: spontaneous breathing, nonlabored ventilation, respiratory function stable and patient connected to nasal cannula oxygen Cardiovascular status: stable and blood pressure returned to baseline Postop Assessment: no apparent nausea or vomiting Anesthetic complications: no   No notable events documented.  Last Vitals:  Vitals:   02/20/22 1949 02/21/22 0536  BP: (!) 150/85 (!) 141/81  Pulse: 82 77  Resp: 18 18  Temp: 37.3 C 37 C  SpO2: 99% 97%    Last Pain:  Vitals:   02/20/22 1949  TempSrc:   PainSc: 0-No pain                 Elmus Mathes S

## 2022-02-21 NOTE — TOC Transition Note (Addendum)
Transition of Care [facility]) - CM/SW Discharge Note   Patient Details  Name: Johnny Mcknight MRN: [id number] Date of Birth: 1956/07/27  [facility] CM/SW Contact:  Vassie Moselle, LCSW Phone Number: 02/21/2022, 11:22 AM   Clinical Narrative:    Met with pt and confirmed recommendations for HHPT/OT. Pt was agreeable to referral being made for these services and states he has not received HH in the past and has no preferences for what agency is used. CSW confirmed with pt the need for rw. Danielle with [employer] is to deliver rw to pt's room. Alvis Lemmings has accepted this pt for HHPT/OT. No further TOC needs identified at this time.   1300: CSW received message from RN that pt/family are interested in Meals on Wheels and/or other food services. CSW met with pt and family and provided them the information for the [facility] and encouraged them to call to complete referral/intake as their wait list for Meals on Wheels can be extensive. CSW also provided other resources for food sources to pt and family.   Final next level of care: Home w Home Health Services Barriers to Discharge: No Barriers Identified   Patient Goals and CMS Choice Patient states their goals for this hospitalization and ongoing recovery are:: Return home   Choice offered to / list presented to : Patient  Discharge Placement                       Discharge Plan and Services                DME Arranged: Walker rolling DME Agency: [employer] Date DME Agency Contacted: 02/21/22 Time DME Agency Contacted: 52 Representative spoke with at DME Agency: Andee Poles HH Arranged: PT, OT [place] Agency: [place] Date [place]: 02/21/22 Time [place]: 1122 Representative spoke with at [place]: [place] Determinants of Health ([place]) Interventions     Readmission Risk Interventions    02/18/2022   10:40 AM  Readmission Risk Prevention Plan  Post  Dischage Appt Complete  Medication Screening Complete  Transportation Screening Complete

## 2022-02-21 NOTE — Care Management Important Message (Signed)
Important Message  Patient Details IM Letter given to the Patient. Name: Eber Ferrufino MRN: [id number] Date of Birth: 21-Sep-1955   Medicare Important Message Given:  Yes     Kerin Salen 02/21/2022, 10:19 AM

## 2022-02-21 NOTE — Evaluation (Signed)
Occupational Therapy Evaluation Patient Details Name: Johnny Mcknight MRN: [id number] DOB: 09/18/1956 Today's Date: 02/21/2022   History of Present Illness Pt is a 66yo male presenting to [facility] ED on 5/31 after having a syncopal episode, third in two weeks. HgB 4.4 received 4 units of PRBC. Found to have symptomatic anemia, ETOH dependence. s/p EGD and colonoscopy, found to have hemorrhoids and diverticulosis. CT abdomen revelead cirrhosis of the liver, other imaging negative for acute changes.  PMH: GERD, HTN, alcoholic polyneuropathy, thiamine/vitaminD/Zinc deficiency   Clinical Impression   Patient is a 66 year old male who was admitted for above. Patient was living at home with two sisters with conflicting information in chart and from patient about stairs to enter. Patient was min guard for Adls with RW with noted unsteadiness with LLE with attempted functional mobility without AD support. Patient reported his weakness is from his neuropathy. Patient would continue to benefit from skilled OT services at this time while admitted and after d/c to address noted deficits in order to improve overall safety and independence in ADLs.       Recommendations for follow up therapy are one component of a multi-disciplinary discharge planning process, led by the attending physician.  Recommendations may be updated based on patient status, additional functional criteria and insurance authorization.   Follow Up Recommendations  Home health OT    Assistance Recommended at Discharge Frequent or constant Supervision/Assistance  Patient can return home with the following Assistance with cooking/housework;Direct supervision/assist for financial management;Assist for transportation;Help with stairs or ramp for entrance;Direct supervision/assist for medications management;A little help with bathing/dressing/bathroom;A little help with walking and/or transfers    Functional Status Assessment  Patient has had a recent  decline in their functional status and demonstrates the ability to make significant improvements in function in a reasonable and predictable amount of time.  Equipment Recommendations  Other (comment) (RW)    Recommendations for Other Services       Precautions / Restrictions Precautions Precautions: Fall Restrictions Weight Bearing Restrictions: No      Mobility Bed Mobility Overal bed mobility: Needs Assistance Bed Mobility: Supine to Sit     Supine to sit: Supervision     General bed mobility comments: with HOB raised    Transfers                          Balance Overall balance assessment: Mild deficits observed, not formally tested                                         ADL either performed or assessed with clinical judgement   ADL Overall ADL's : Needs assistance/impaired Eating/Feeding: Modified independent;Sitting   Grooming: Wash/dry face;Min guard;Standing;Wash/dry hands   Upper Body Bathing: Supervision/ safety;Standing   Lower Body Bathing: Supervison/ safety;Sit to/from stand Lower Body Bathing Details (indicate cue type and reason): with increased time Upper Body Dressing : Supervision/safety;Standing   Lower Body Dressing: Set up;Sit to/from stand;Sitting/lateral leans Lower Body Dressing Details (indicate cue type and reason): sitting EOB to don socks Toilet Transfer: Min guard;Rolling walker (2 wheels) Toilet Transfer Details (indicate cue type and reason): patient attempted to complete transition from edge of bed to bathroom with no AE or UE support with notable instability in LLE with attemtps at movement with patient reporting " it have been this way awhile". patient was noted  to have improved abiltiy to engage in ADLs with RW with eduaction on proper positioning for ADL tasks. Toileting- Clothing Manipulation and Hygiene: Supervision/safety;Sit to/from stand               Vision Baseline Vision/History: 1  Wears glasses Patient Visual Report: No change from baseline       Perception     Praxis      Pertinent Vitals/Pain Pain Assessment Pain Assessment: No/denies pain     Hand Dominance Right   Extremity/Trunk Assessment Upper Extremity Assessment Upper Extremity Assessment: Generalized weakness (patient has ROM WFL. strength was 3+/5 with inability to push the top down to spray cleaner with dominant hand.)   Lower Extremity Assessment Lower Extremity Assessment: Defer to PT evaluation   Cervical / Trunk Assessment Cervical / Trunk Assessment: Normal   Communication Communication Communication: No difficulties   Cognition Arousal/Alertness: Awake/alert Behavior During Therapy: WFL for tasks assessed/performed Overall Cognitive Status: Within Functional Limits for tasks assessed                                       General Comments       Exercises     Shoulder Instructions      Home Living Family/patient expects to be discharged to:: Private residence Living Arrangements: Other relatives (Two sisters) Available Help at Discharge: Available PRN/intermittently;Family (Sisters work during the day) Type of Home: House Home Access: Level entry     Home Layout: Able to live on main level with bedroom/bathroom;Two level Alternate Level Stairs-Number of Steps: 7 Alternate Level Stairs-Rails: Left Bathroom Shower/Tub: Occupational psychologist: Handicapped height     Home Equipment: None          Prior Functioning/Environment Prior Level of Function : Independent/Modified Independent             Mobility Comments: ind ADLs Comments: ind        OT Problem List: Decreased activity tolerance;Impaired balance (sitting and/or standing);Decreased safety awareness;Decreased knowledge of use of DME or AE;Impaired UE functional use;Decreased strength      OT Treatment/Interventions: Self-care/ADL training;Therapeutic  exercise;Neuromuscular education;Energy conservation;DME and/or AE instruction;Therapeutic activities;Balance training;Patient/family education    OT Goals(Current goals can be found in the care plan section) Acute Rehab OT Goals Patient Stated Goal: to get out of bed OT Goal Formulation: With patient Time For Goal Achievement: 03/07/22 Potential to Achieve Goals: Good  OT Frequency: Min 2X/week    Co-evaluation              AM-PAC OT "6 Clicks" Daily Activity     Outcome Measure Help from another person eating meals?: A Little Help from another person taking care of personal grooming?: A Little Help from another person toileting, which includes using toliet, bedpan, or urinal?: A Little Help from another person bathing (including washing, rinsing, drying)?: A Little Help from another person to put on and taking off regular upper body clothing?: None Help from another person to put on and taking off regular lower body clothing?: A Little 6 Click Score: 19   End of Session Equipment Utilized During Treatment: Gait belt;Rolling walker (2 wheels) Nurse Communication: Other (comment) (ok to participate in session)  Activity Tolerance: Patient tolerated treatment well Patient left: in chair;with call bell/phone within reach  OT Visit Diagnosis: Unsteadiness on feet (R26.81);Other abnormalities of gait and mobility (R26.89)  Time: 9290-9030 OT Time Calculation (min): 28 min Charges:  OT General Charges $OT Visit: 1 Visit OT Evaluation $OT Eval Moderate Complexity: 1 Mod OT Treatments $Self Care/Home Management : 8-22 mins  Jackelyn Poling OTR/L, MS Acute Rehabilitation Department Office# [phone] Pager# [phone]   Marcellina Millin 02/21/2022, 10:08 AM

## 2022-02-21 NOTE — Progress Notes (Signed)
Await PT Eval for d/c done on 6/4. Hgb stable. Eulogio Bear DO

## 2022-02-22 ENCOUNTER — Telehealth: Payer: Self-pay

## 2022-02-22 LAB — MITOCHONDRIAL ANTIBODIES: Mitochondrial M2 Ab, IgG: <20 Units (ref 0.0–20.0)

## 2022-02-22 NOTE — Telephone Encounter (Signed)
Patient has been made aware of follow up & to come in for labs in 1 week. He has been advised to come in between 7:30 am - 5:00 pm to the basement lab facility. Patient verbalized understanding, and had no further questions.

## 2022-02-22 NOTE — Telephone Encounter (Signed)
Transition Care Management Follow-up Telephone Call Date of discharge and from where: [facility] 02-21-22 Dx: symptomatic anemia How have you been since you were released from the hospital? Doing pretty decent  Any questions or concerns? No  Items Reviewed: Did the pt receive and understand the discharge instructions provided? Yes  Medications obtained and verified? Yes  Other? No  Any new allergies since your discharge? No  Dietary orders reviewed? Yes Do you have support at home? Yes   Home Care and Equipment/Supplies: Were home health services ordered? Yes PT If so, what is the name of the agency? Unsure   Has the agency set up a time to come to the patient's home? no Were any new equipment or medical supplies ordered?  Yes: walker  What is the name of the medical supply agency? Hospital  Were you able to get the supplies/equipment? yes Do you have any questions related to the use of the equipment or supplies? No  Functional Questionnaire: (I = Independent and D = Dependent) ADLs: I  Bathing/Dressing- I  Meal Prep- I  Eating- I  Maintaining continence- I  Transferring/Ambulation- I  Managing Meds- I  Follow up appointments reviewed:  PCP Hospital f/u appt confirmed? Yes  Scheduled to see Dr Alain Marion  on 03-03-22 @ 140pm. [facility] f/u appt confirmed? Yes  Scheduled to see Dr Lyndel Safe on 03-15-22 @ 930am. Are transportation arrangements needed? No  If their condition worsens, is the pt aware to call PCP or go to the Emergency Dept.? Yes Was the patient provided with contact information for the PCP's office or ED? Yes Was to pt encouraged to call back with questions or concerns? Yes

## 2022-02-22 NOTE — Telephone Encounter (Signed)
Noted. Thx.

## 2022-02-23 LAB — ANTI-SMOOTH MUSCLE ANTIBODY, IGG: F-Actin IgG: 18 Units (ref 0–19)

## 2022-02-27 ENCOUNTER — Encounter: Payer: Self-pay | Admitting: Gastroenterology

## 2022-02-27 NOTE — Progress Notes (Signed)
Please see biopsy results.

## 2022-02-28 ENCOUNTER — Telehealth: Payer: Self-pay

## 2022-02-28 ENCOUNTER — Other Ambulatory Visit (INDEPENDENT_AMBULATORY_CARE_PROVIDER_SITE_OTHER): Payer: Medicare HMO

## 2022-02-28 ENCOUNTER — Inpatient Hospital Stay (HOSPITAL_COMMUNITY)
Admission: EM | Admit: 2022-02-28 | Discharge: 2022-03-06 | DRG: 378 | Disposition: A | Discharge status: Home Health | Payer: Medicare HMO | Attending: Internal Medicine | Admitting: Internal Medicine

## 2022-02-28 ENCOUNTER — Encounter (HOSPITAL_COMMUNITY): Payer: Self-pay

## 2022-02-28 DIAGNOSIS — F17210 Nicotine dependence, cigarettes, uncomplicated: Secondary | ICD-10-CM | POA: Diagnosis not present

## 2022-02-28 DIAGNOSIS — R161 Splenomegaly, not elsewhere classified: Secondary | ICD-10-CM | POA: Diagnosis present

## 2022-02-28 DIAGNOSIS — K5521 Angiodysplasia of colon with hemorrhage: Secondary | ICD-10-CM | POA: Diagnosis not present

## 2022-02-28 DIAGNOSIS — I8510 Secondary esophageal varices without bleeding: Secondary | ICD-10-CM | POA: Diagnosis not present

## 2022-02-28 DIAGNOSIS — K922 Gastrointestinal hemorrhage, unspecified: Secondary | ICD-10-CM | POA: Diagnosis present

## 2022-02-28 DIAGNOSIS — Z888 Allergy status to other drugs, medicaments and biological substances status: Secondary | ICD-10-CM

## 2022-02-28 DIAGNOSIS — K31811 Angiodysplasia of stomach and duodenum with bleeding: Secondary | ICD-10-CM | POA: Diagnosis not present

## 2022-02-28 DIAGNOSIS — K766 Portal hypertension: Secondary | ICD-10-CM | POA: Diagnosis present

## 2022-02-28 DIAGNOSIS — K317 Polyp of stomach and duodenum: Secondary | ICD-10-CM | POA: Diagnosis present

## 2022-02-28 DIAGNOSIS — D509 Iron deficiency anemia, unspecified: Secondary | ICD-10-CM | POA: Diagnosis not present

## 2022-02-28 DIAGNOSIS — Z881 Allergy status to other antibiotic agents status: Secondary | ICD-10-CM | POA: Diagnosis not present

## 2022-02-28 DIAGNOSIS — K7030 Alcoholic cirrhosis of liver without ascites: Secondary | ICD-10-CM | POA: Diagnosis present

## 2022-02-28 DIAGNOSIS — K31819 Angiodysplasia of stomach and duodenum without bleeding: Secondary | ICD-10-CM | POA: Diagnosis not present

## 2022-02-28 DIAGNOSIS — D500 Iron deficiency anemia secondary to blood loss (chronic): Secondary | ICD-10-CM | POA: Diagnosis not present

## 2022-02-28 DIAGNOSIS — I129 Hypertensive chronic kidney disease with stage 1 through stage 4 chronic kidney disease, or unspecified chronic kidney disease: Secondary | ICD-10-CM | POA: Diagnosis present

## 2022-02-28 DIAGNOSIS — D132 Benign neoplasm of duodenum: Secondary | ICD-10-CM | POA: Diagnosis not present

## 2022-02-28 DIAGNOSIS — K633 Ulcer of intestine: Secondary | ICD-10-CM

## 2022-02-28 DIAGNOSIS — F10288 Alcohol dependence with other alcohol-induced disorder: Secondary | ICD-10-CM | POA: Diagnosis present

## 2022-02-28 DIAGNOSIS — D649 Anemia, unspecified: Secondary | ICD-10-CM | POA: Diagnosis not present

## 2022-02-28 DIAGNOSIS — K643 Fourth degree hemorrhoids: Secondary | ICD-10-CM | POA: Diagnosis not present

## 2022-02-28 DIAGNOSIS — K219 Gastro-esophageal reflux disease without esophagitis: Secondary | ICD-10-CM | POA: Diagnosis present

## 2022-02-28 DIAGNOSIS — K2970 Gastritis, unspecified, without bleeding: Secondary | ICD-10-CM | POA: Diagnosis present

## 2022-02-28 DIAGNOSIS — K3189 Other diseases of stomach and duodenum: Secondary | ICD-10-CM | POA: Diagnosis present

## 2022-02-28 DIAGNOSIS — K5730 Diverticulosis of large intestine without perforation or abscess without bleeding: Secondary | ICD-10-CM | POA: Diagnosis present

## 2022-02-28 DIAGNOSIS — D539 Nutritional anemia, unspecified: Secondary | ICD-10-CM | POA: Diagnosis present

## 2022-02-28 DIAGNOSIS — J9601 Acute respiratory failure with hypoxia: Secondary | ICD-10-CM | POA: Diagnosis present

## 2022-02-28 DIAGNOSIS — G709 Myoneural disorder, unspecified: Secondary | ICD-10-CM | POA: Diagnosis not present

## 2022-02-28 DIAGNOSIS — K921 Melena: Secondary | ICD-10-CM | POA: Diagnosis not present

## 2022-02-28 DIAGNOSIS — K5520 Angiodysplasia of colon without hemorrhage: Secondary | ICD-10-CM | POA: Diagnosis not present

## 2022-02-28 DIAGNOSIS — B953 Streptococcus pneumoniae as the cause of diseases classified elsewhere: Secondary | ICD-10-CM | POA: Diagnosis present

## 2022-02-28 DIAGNOSIS — D638 Anemia in other chronic diseases classified elsewhere: Secondary | ICD-10-CM | POA: Diagnosis not present

## 2022-02-28 DIAGNOSIS — I10 Essential (primary) hypertension: Secondary | ICD-10-CM | POA: Diagnosis not present

## 2022-02-28 DIAGNOSIS — K5792 Diverticulitis of intestine, part unspecified, without perforation or abscess without bleeding: Secondary | ICD-10-CM | POA: Diagnosis not present

## 2022-02-28 DIAGNOSIS — Z8249 Family history of ischemic heart disease and other diseases of the circulatory system: Secondary | ICD-10-CM | POA: Diagnosis not present

## 2022-02-28 DIAGNOSIS — N1831 Chronic kidney disease, stage 3a: Secondary | ICD-10-CM | POA: Diagnosis present

## 2022-02-28 DIAGNOSIS — K279 Peptic ulcer, site unspecified, unspecified as acute or chronic, without hemorrhage or perforation: Secondary | ICD-10-CM | POA: Diagnosis not present

## 2022-02-28 DIAGNOSIS — R296 Repeated falls: Secondary | ICD-10-CM | POA: Diagnosis present

## 2022-02-28 DIAGNOSIS — R7881 Bacteremia: Secondary | ICD-10-CM | POA: Diagnosis present

## 2022-02-28 DIAGNOSIS — F17290 Nicotine dependence, other tobacco product, uncomplicated: Secondary | ICD-10-CM | POA: Diagnosis not present

## 2022-02-28 DIAGNOSIS — J449 Chronic obstructive pulmonary disease, unspecified: Secondary | ICD-10-CM | POA: Diagnosis not present

## 2022-02-28 DIAGNOSIS — Z79899 Other long term (current) drug therapy: Secondary | ICD-10-CM

## 2022-02-28 DIAGNOSIS — K644 Residual hemorrhoidal skin tags: Secondary | ICD-10-CM | POA: Diagnosis present

## 2022-02-28 LAB — COMPREHENSIVE METABOLIC PANEL
ALT: 19 U/L (ref 0–53)
ALT: 22 U/L (ref 0–44)
AST: 48 U/L — ABNORMAL HIGH (ref 0–37)
AST: 54 U/L — ABNORMAL HIGH (ref 15–41)
Albumin: 3.2 g/dL — ABNORMAL LOW (ref 3.5–5.2)
Albumin: 3 g/dL — ABNORMAL LOW (ref 3.5–5.0)
Alkaline Phosphatase: 190 U/L — ABNORMAL HIGH (ref 39–117)
Alkaline Phosphatase: 181 U/L — ABNORMAL HIGH (ref 38–126)
Anion gap: 8 (ref 5–15)
BUN: 13 mg/dL (ref 6–23)
BUN: 16 mg/dL (ref 8–23)
CO2: 20 mEq/L (ref 19–32)
CO2: 20 mmol/L — ABNORMAL LOW (ref 22–32)
Calcium: 8.6 mg/dL (ref 8.4–10.5)
Calcium: 8.8 mg/dL — ABNORMAL LOW (ref 8.9–10.3)
Chloride: 108 mEq/L (ref 96–112)
Chloride: 112 mmol/L — ABNORMAL HIGH (ref 98–111)
Creatinine, Ser: 1.49 mg/dL (ref 0.40–1.50)
Creatinine, Ser: 1.58 mg/dL — ABNORMAL HIGH (ref 0.61–1.24)
GFR, Estimated: 49 mL/min — ABNORMAL LOW (ref >60)
GFR: 49.53 mL/min — ABNORMAL LOW (ref >60.00)
Glucose, Bld: 114 mg/dL — ABNORMAL HIGH (ref 70–99)
Glucose, Bld: 145 mg/dL — ABNORMAL HIGH (ref 70–99)
Potassium: 3.6 mEq/L (ref 3.5–5.1)
Potassium: 3.6 mmol/L (ref 3.5–5.1)
Sodium: 137 mEq/L (ref 135–145)
Sodium: 140 mmol/L (ref 135–145)
Total Bilirubin: 1.3 mg/dL — ABNORMAL HIGH (ref 0.2–1.2)
Total Bilirubin: 1.6 mg/dL — ABNORMAL HIGH (ref 0.3–1.2)
Total Protein: 7.6 g/dL (ref 6.0–8.3)
Total Protein: 7.8 g/dL (ref 6.5–8.1)

## 2022-02-28 LAB — CBC WITH DIFFERENTIAL/PLATELET
Basophils Absolute: 0.1 10*3/uL (ref 0.0–0.1)
Basophils Relative: 0.8 % (ref 0.0–3.0)
Eosinophils Absolute: 0.2 10*3/uL (ref 0.0–0.7)
Eosinophils Relative: 2.5 % (ref 0.0–5.0)
HCT: 20.1 % — CL (ref 39.0–52.0)
Hemoglobin: 6.6 g/dL — CL (ref 13.0–17.0)
Lymphocytes Relative: 14.2 % (ref 12.0–46.0)
Lymphs Abs: 1.1 10*3/uL (ref 0.7–4.0)
MCHC: 32.5 g/dL (ref 30.0–36.0)
MCV: 88.5 fl (ref 78.0–100.0)
Monocytes Absolute: 0.6 10*3/uL (ref 0.1–1.0)
Monocytes Relative: 8.4 % (ref 3.0–12.0)
Neutro Abs: 5.5 10*3/uL (ref 1.4–7.7)
Neutrophils Relative %: 74.1 % (ref 43.0–77.0)
Platelets: 203 10*3/uL (ref 150.0–400.0)
RBC: 2.27 Mil/uL — ABNORMAL LOW (ref 4.22–5.81)
RDW: 19.3 % — ABNORMAL HIGH (ref 11.5–15.5)
WBC: 7.4 10*3/uL (ref 4.0–10.5)

## 2022-02-28 LAB — CBC
HCT: 20.6 % — ABNORMAL LOW (ref 39.0–52.0)
Hemoglobin: 6.4 g/dL — CL (ref 13.0–17.0)
MCH: 28.7 pg (ref 26.0–34.0)
MCHC: 31.1 g/dL (ref 30.0–36.0)
MCV: 92.4 fL (ref 80.0–100.0)
Platelets: 212 10*3/uL (ref 150–400)
RBC: 2.23 MIL/uL — ABNORMAL LOW (ref 4.22–5.81)
RDW: 18.6 % — ABNORMAL HIGH (ref 11.5–15.5)
WBC: 7.2 10*3/uL (ref 4.0–10.5)
nRBC: 0 % (ref 0.0–0.2)

## 2022-02-28 LAB — PREPARE RBC (CROSSMATCH)

## 2022-02-28 MED ORDER — THIAMINE HCL 100 MG PO TABS
100.0000 mg | ORAL_TABLET | Freq: Every day | ORAL | Status: DC
  Administered 2022-03-01 – 2022-03-06 (×5): 100 mg via ORAL
  Filled 2022-02-28 (×5): qty 1

## 2022-02-28 MED ORDER — LORAZEPAM 1 MG PO TABS: 1.0000 mg | ORAL_TABLET | ORAL | Status: AC | PRN

## 2022-02-28 MED ORDER — LORAZEPAM 2 MG/ML IJ SOLN: 1.0000 mg | INTRAMUSCULAR | Status: AC | PRN

## 2022-02-28 MED ORDER — ACETAMINOPHEN 650 MG RE SUPP: 650.0000 mg | Freq: Four times a day (QID) | RECTAL | Status: DC | PRN

## 2022-02-28 MED ORDER — ONDANSETRON HCL 4 MG PO TABS: 4.0000 mg | ORAL_TABLET | Freq: Four times a day (QID) | ORAL | Status: DC | PRN

## 2022-02-28 MED ORDER — ACETAMINOPHEN 325 MG PO TABS: 650.0000 mg | ORAL_TABLET | Freq: Four times a day (QID) | ORAL | Status: DC | PRN

## 2022-02-28 MED ORDER — SODIUM CHLORIDE 0.9% IV SOLUTION
Freq: Once | INTRAVENOUS | Status: AC
Start: 2022-02-28 — End: 2022-02-28

## 2022-02-28 MED ORDER — FOLIC ACID 1 MG PO TABS
1.0000 mg | ORAL_TABLET | Freq: Every day | ORAL | Status: DC
  Administered 2022-03-01 – 2022-03-06 (×5): 1 mg via ORAL
  Filled 2022-02-28 (×5): qty 1

## 2022-02-28 MED ORDER — ONDANSETRON HCL 4 MG/2ML IJ SOLN: 4.0000 mg | Freq: Four times a day (QID) | INTRAMUSCULAR | Status: DC | PRN

## 2022-02-28 MED ORDER — PANTOPRAZOLE SODIUM 40 MG IV SOLR
40.0000 mg | Freq: Two times a day (BID) | INTRAVENOUS | Status: DC
  Administered 2022-02-28 – 2022-03-06 (×11): 40 mg via INTRAVENOUS
  Filled 2022-02-28 (×11): qty 10

## 2022-02-28 MED ORDER — ADULT MULTIVITAMIN W/MINERALS CH
1.0000 | ORAL_TABLET | Freq: Every day | ORAL | Status: DC
  Administered 2022-03-01 – 2022-03-06 (×5): 1 via ORAL
  Filled 2022-02-28 (×5): qty 1

## 2022-02-28 MED ORDER — THIAMINE HCL 100 MG/ML IJ SOLN
100.0000 mg | Freq: Every day | INTRAMUSCULAR | Status: DC
  Filled 2022-02-28 (×3): qty 2

## 2022-02-28 MED ORDER — HYDRALAZINE HCL 25 MG PO TABS: 25.0000 mg | ORAL_TABLET | Freq: Four times a day (QID) | ORAL | Status: DC | PRN

## 2022-02-28 NOTE — ED Provider Notes (Signed)
[facility] DEPT Provider Note   CSN: [id number] Arrival date & time: 02/28/22  1406     History  Chief Complaint  Patient presents with   Low Hemoglobin     6.6    Johnny Mcknight is a 66 y.o. male.  HPI  Patient presents with anemia.  Recent admission to the hospital.  Had been admitted and had scopes from above and below without clear cause of bleeding found.  Hemoglobin had gone down to 4.  Transfuse up to 8.1.  Over the last week hemoglobin is now gone down to 6.6.  Not feeling lightheaded or fatigued.  States he was lightheaded and dizzy with the last episode.  No bleeding seen.    Home Medications Prior to Admission medications   Medication Sig Start Date End Date Taking? Authorizing Provider  ferrous sulfate 325 (65 FE) MG tablet Take 1 tablet (325 mg total) by mouth 2 (two) times daily with a meal. 02/20/22 04/21/22  Bonnell Public, MD  folic acid (FOLVITE) 1 MG tablet Take 1 tablet (1 mg total) by mouth daily. 02/21/22 04/22/22  Dana Allan I, MD  hydrocortisone (ANUSOL-HC) 2.5 % rectal cream Place rectally 2 (two) times daily. 02/20/22   Bonnell Public, MD  Multiple Vitamin (MULTIVITAMIN WITH MINERALS) TABS tablet Take 1 tablet by mouth daily. 02/21/22 04/22/22  Dana Allan I, MD  pantoprazole (PROTONIX) 40 MG tablet Take 1 tablet (40 mg total) by mouth daily. 02/21/22 04/22/22  Dana Allan I, MD  thiamine 100 MG tablet Take 1 tablet (100 mg total) by mouth daily. 02/20/22 04/21/22  Dana Allan I, MD  zinc sulfate 50 MG CAPS capsule Take 1 capsule (220 mg total) by mouth 2 (two) times daily. 02/20/22 04/21/22  Dana Allan I, MD      Allergies    Doxycycline, Omeprazole-sodium bicarbonate, and Propranolol    Review of Systems   Review of Systems  Constitutional:  Negative for appetite change.    Physical Exam Updated Vital Signs BP (!) 150/92   Pulse (!) 104   Temp 98.4 F (36.9 C) (Oral)   Resp 18   SpO2 100%   Physical Exam Vitals and nursing note reviewed.  Cardiovascular:     Rate and Rhythm: Tachycardia present.     Comments: Heart rate at upper limits of normal to mildly tachycardic at times. Musculoskeletal:        General: No tenderness.  Skin:    General: Skin is warm.     Capillary Refill: Capillary refill takes less than 2 seconds.  Neurological:     Mental Status: He is alert and oriented to person, place, and time.     ED Results / Procedures / Treatments   Labs (all labs ordered are listed, but only abnormal results are displayed) Labs Reviewed  COMPREHENSIVE METABOLIC PANEL - Abnormal; Notable for the following components:      Result Value   Chloride 112 (*)    CO2 20 (*)    Glucose, Bld 145 (*)    Creatinine, Ser 1.58 (*)    Calcium 8.8 (*)    Albumin 3.0 (*)    AST 54 (*)    Alkaline Phosphatase 181 (*)    Total Bilirubin 1.6 (*)    GFR, Estimated 49 (*)    All other components within normal limits  CBC - Abnormal; Notable for the following components:   RBC 2.23 (*)    Hemoglobin 6.4 (*)    HCT  20.6 (*)    RDW 18.6 (*)    All other components within normal limits  TYPE AND SCREEN  PREPARE RBC (CROSSMATCH)    EKG None  Radiology No results found.  Procedures Procedures    Medications Ordered in ED Medications  0.9 %  sodium chloride infusion (Manually program via Guardrails IV Fluids) (has no administration in time range)    ED Course/ Medical Decision Making/ A&P                           Medical Decision Making Amount and/or Complexity of Data Reviewed Labs: ordered.   Patient with recurrent anemia.  Over the last week has dropped 2 g.  Patient feels overall good but does have some elevated heart rate.  I think you will require transfusion likely over a couple units and I think would benefit from admission to the hospital with observation to be rechecked tomorrow to make sure the hemoglobin has decreased again since no clear cause of the  anemia had been found.  Will discuss with hospitalist for admission        Final Clinical Impression(s) / ED Diagnoses Final diagnoses:  Anemia, unspecified type    Rx / DC Orders ED Discharge Orders     None         Davonna Belling, MD 02/28/22 1635

## 2022-02-28 NOTE — Telephone Encounter (Signed)
Pt was notified of Dr. Lyndel Safe recommendations to go the [facility] today today for a Blood Transfusion due to a low Hgb. Pt stated that he was getting something to eat right now and will proceed to hospital right after. Pt was notified of the importance to proceed to the Hospital due to his Low Hgb. Pt verbalized understanding with all questions answered.

## 2022-02-28 NOTE — ED Provider Triage Note (Signed)
Emergency Medicine Provider Triage Evaluation Note  Johnny Mcknight , a 66 y.o. male  was evaluated in triage.  Pt complains of low hemoglobin, told to come for evaluation by his GI doctor.  Patient was just seen and admitted for symptomatic anemia 12 days ago, had hemoglobin of 4.4.  Since then has had EGD, colonoscopy.  He had a polyp removed.  He has some history of external and internal hemorrhoids.  He has a history of alcohol abuse with liver disease, portal hypertension.  Patient reports since discharge he has not noticed any dark, tarry stools, bright red blood per rectum, hematemesis, hematuria.  Patient denies feeling short of breath, fatigue from his low hemoglobin.  He continues take his oral iron supplements, he denies any alcohol use since his admission.  Review of Systems  Positive: Low hemoglobin Negative: Hematemesis, hematochezia, hematuria  Physical Exam  BP 121/71 (BP Location: Left Arm)   Pulse 99   Temp 98.4 F (36.9 C) (Oral)   Resp 16   SpO2 100%  Gen:   Awake, no distress   Resp:  Normal effort  MSK:   Moves extremities without difficulty  Other:  No TTP, pale mucus membranes noted  Medical Decision Making  Medically screening exam initiated at 2:53 PM.  Appropriate orders placed.  Johnny Mcknight was informed that the remainder of the evaluation will be completed by another provider, this initial triage assessment does not replace that evaluation, and the importance of remaining in the ED until their evaluation is complete.  Workup initiated   Anselmo Pickler, [place] 02/28/22 1455

## 2022-02-28 NOTE — ED Triage Notes (Addendum)
Pt was sent over by GI for blood transfusion.  Hemoglobin 6.6  Pt has a hx of blood transfusion.   Denies sx at this time.   A/Ox4 Ambulatory in triage.

## 2022-02-28 NOTE — H&P (Signed)
TRH H&P    Patient Demographics:    Johnny Mcknight, is a 66 y.o. male  MRN: [id number]  DOB - 05-Jan-1956  Admit Date - 02/28/2022  Referring MD/NP/PA: Davonna Belling  Outpatient Primary MD for the patient is Plotnikov, Evie Lacks, MD  Patient coming from: GI clinic outpatient, Dr. Lyndel Safe  Chief complaint-anemia   HPI:    Johnny Mcknight  is a 66 y.o. male, with past medical history of GERD, alcohol abuse, chronic liver disease, alcoholic polyneuropathy, thiamine deficiency, vitamin D deficiency, zinc deficiency who was recently discharged from the hospital after he was admitted for symptomatic anemia.  Required blood transfusion.  Underwent EGD and colonoscopy.  Hemorrhoids were found on the exam, poor preparation for colonoscopy noted.  Also showed diverticulosis in the sigmoid colon in the descending and ascending colon.  EGD showed esophageal varices, gastritis which was biopsied. Patient was sent home on iron supplementation along with Protonix. Today patient went to Dr. Lyndel Safe clinic for follow-up as outpatient and was found to have hemoglobin of 6.6.  Patient was sent to ED for blood transfusion.  Patient denies chest pain or shortness of breath Denies black stool or blood in the stool Denies nausea vomiting or diarrhea Denies blurred vision or dizziness Denies vomiting blood    Review of systems:    In addition to the HPI above,   All other systems reviewed and are negative.    Past History of the following :    Past Medical History:  Diagnosis Date   GERD (gastroesophageal reflux disease)    Polyneuropathy, alcoholic ([place]) 12/26/6757   Gabapentin prn d/c B complex po 11/22 Re-start B complex po Risks associated with treatment noncompliance were discussed. Compliance was encouraged. [place]   Streptococcal pneumonia ([place]) 09/27/2015   1/17   Thiamine deficiency 10/02/2020   Alcohol related.  On B complex  Risks associated with treatment noncompliance were discussed. Compliance was encouraged.    Vitamin D deficiency 05/14/2018   2019   Zinc deficiency 04/03/2020   2021 Zinc 220 mg bid Risks associated with treatment noncompliance were discussed. Compliance was encouraged.      Past Surgical History:  Procedure Laterality Date   BIOPSY  02/18/2022   Procedure: BIOPSY;  Surgeon: Jackquline Denmark, MD;  Location: [facility];  Service: Gastroenterology;;   COLONOSCOPY WITH PROPOFOL N/A 02/18/2022   Procedure: COLONOSCOPY WITH PROPOFOL;  Surgeon: Jackquline Denmark, MD;  Location: [facility];  Service: Gastroenterology;  Laterality: N/A;   ESOPHAGOGASTRODUODENOSCOPY (EGD) WITH PROPOFOL N/A 02/18/2022   Procedure: ESOPHAGOGASTRODUODENOSCOPY (EGD) WITH PROPOFOL;  Surgeon: Jackquline Denmark, MD;  Location: [facility] END[facility];  Service: Gastroenterology;  Laterality: N/A;   POLYPECTOMY  02/18/2022   Procedure: POLYPECTOMY;  Surgeon: Jackquline Denmark, MD;  Location: [facility] ENDOSCOPY;  Service: Gastroenterology;;   TEE WITHOUT CARDIOVERSION N/A 09/29/2015   Procedure: TRANSESOPHAGEAL ECHOCARDIOGRAM (TEE);  Surgeon: Jerline Pain, MD;  Location: [facility] ENDOSCOPY;  Service: Cardiovascular;  Laterality: N/A;      Social History:      Social History   Tobacco Use   Smoking  status: Some Days    Types: Pipe   Smokeless tobacco: Never  Substance Use Topics   Alcohol use: Yes    Comment: vodka 1/2 pint a day       Family History :     Family History  Problem Relation Age of Onset   Hypertension Other    Heart disease Father 32       MI   Hypertension Father    Hypertension Mother    Diabetes Mother    Diabetes Maternal Aunt    Stroke Maternal Aunt    Diabetes Maternal Grandmother       Home Medications:   Prior to Admission medications   Medication Sig Start Date End Date Taking? Authorizing Provider  ferrous sulfate 325 (65 FE) MG tablet Take 1 tablet (325 mg total) by mouth 2 (two) times daily with a meal.  02/20/22 04/21/22  Bonnell Public, MD  folic acid (FOLVITE) 1 MG tablet Take 1 tablet (1 mg total) by mouth daily. 02/21/22 04/22/22  Dana Allan I, MD  hydrocortisone (ANUSOL-HC) 2.5 % rectal cream Place rectally 2 (two) times daily. 02/20/22   Bonnell Public, MD  Multiple Vitamin (MULTIVITAMIN WITH MINERALS) TABS tablet Take 1 tablet by mouth daily. 02/21/22 04/22/22  Dana Allan I, MD  pantoprazole (PROTONIX) 40 MG tablet Take 1 tablet (40 mg total) by mouth daily. 02/21/22 04/22/22  Dana Allan I, MD  thiamine 100 MG tablet Take 1 tablet (100 mg total) by mouth daily. 02/20/22 04/21/22  Dana Allan I, MD  zinc sulfate 50 MG CAPS capsule Take 1 capsule (220 mg total) by mouth 2 (two) times daily. 02/20/22 04/21/22  Bonnell Public, MD     Allergies:     Allergies  Allergen Reactions   Doxycycline     REACTION: sleepy   Omeprazole-Sodium Bicarbonate     REACTION: diarrhea   Propranolol     tired     Physical Exam:   Vitals  Blood pressure (!) 146/76, pulse 93, temperature 98.6 F (37 C), temperature source Oral, resp. rate 16, SpO2 100 %.  1.  General: Appears in no acute distress  2. Psychiatric: Alert, oriented x3, intact insight and judgment  3. Neurologic: Cranial nerves II through XII grossly intact  4. HEENMT:  Atraumatic normocephalic, extraocular muscle intact  5. Respiratory : Clear to auscultation bilaterally, no wheezing or crackles auscultated  6. Cardiovascular : S1-S2, regular, no murmur auscultated  7. Gastrointestinal:  Abdomen soft, nontender, no organomegaly     Data Review:    CBC Recent Labs  Lab 02/28/22 1136 02/28/22 1516  WBC 7.4 7.2  HGB 6.6 Repeated and verified X2.* 6.4*  HCT 20.1 Repeated and verified X2.* 20.6*  PLT 203.0 212  MCV 88.5 92.4  MCH  --  28.7  MCHC 32.5 31.1  RDW 19.3* 18.6*  LYMPHSABS 1.1  --   MONOABS 0.6  --   EOSABS 0.2  --   BASOSABS 0.1  --     ------------------------------------------------------------------------------------------------------------------  Results for orders placed or performed during the hospital encounter of 02/28/22 (from the past 48 hour(s))  Comprehensive metabolic panel     Status: Abnormal   Collection Time: 02/28/22  3:16 PM  Result Value Ref Range   Sodium 140 135 - 145 mmol/L   Potassium 3.6 3.5 - 5.1 mmol/L   Chloride 112 (H) 98 - 111 mmol/L   CO2 20 (L) 22 - 32 mmol/L   Glucose, Bld 145 (H) 70 -  99 mg/dL    Comment: Glucose reference range applies only to samples taken after fasting for at least 8 hours.   BUN 16 8 - 23 mg/dL   Creatinine, Ser 1.58 (H) 0.61 - 1.24 mg/dL   Calcium 8.8 (L) 8.9 - 10.3 mg/dL   Total Protein 7.8 6.5 - 8.1 g/dL   Albumin 3.0 (L) 3.5 - 5.0 g/dL   AST 54 (H) 15 - 41 U/L   ALT 22 0 - 44 U/L   Alkaline Phosphatase 181 (H) 38 - 126 U/L   Total Bilirubin 1.6 (H) 0.3 - 1.2 mg/dL   GFR, Estimated 49 (L) >60 mL/min    Comment: (NOTE) Calculated using the CKD-EPI Creatinine Equation (2021)    Anion gap 8 5 - 15    Comment: Performed at [facility], [place] [address], [place], [place] [address]  CBC     Status: Abnormal   Collection Time: 02/28/22  3:16 PM  Result Value Ref Range   WBC 7.2 4.0 - 10.5 K/uL   RBC 2.23 (L) 4.22 - 5.81 MIL/uL   Hemoglobin 6.4 (LL) 13.0 - 17.0 g/dL    Comment: REPEATED TO VERIFY THIS CRITICAL RESULT HAS VERIFIED AND BEEN CALLED TO K.LIVINGSTON, EMT BY NATHAN THOMPSON ON 06 12 2023 AT 1543, AND HAS BEEN READ BACK. CRITICAL RESULT VERIFIED    HCT 20.6 (L) 39.0 - 52.0 %   MCV 92.4 80.0 - 100.0 fL   MCH 28.7 26.0 - 34.0 pg   MCHC 31.1 30.0 - 36.0 g/dL   RDW 18.6 (H) 11.5 - 15.5 %   Platelets 212 150 - 400 K/uL   nRBC 0.0 0.0 - 0.2 %    Comment: Performed at [facility], [place] [address] [place], [place] [address]  Type and screen [place]     Status: None (Preliminary result)    Collection Time: 02/28/22  3:16 PM  Result Value Ref Range   ABO/RH(D) A POS    Antibody Screen NEG    Sample Expiration 03/03/2022,2359    Unit Number [id number]    Blood Component Type RED CELLS,LR    Unit division 00    Status of Unit ISSUED    Transfusion Status OK TO TRANSFUSE    Crossmatch Result      Compatible Performed at [facility], [place] [address] [place], [place] [address]   Prepare RBC (crossmatch)     Status: None   Collection Time: 02/28/22  3:47 PM  Result Value Ref Range   Order Confirmation      ORDER PROCESSED BY BLOOD BANK Performed at [facility], [place] [address] [place], [place] [address]     Chemistries  Recent Labs  Lab 02/28/22 1136 02/28/22 1516  NA 137 140  K 3.6 3.6  CL 108 112*  CO2 20 20*  GLUCOSE 114* 145*  BUN 13 16  CREATININE 1.49 1.58*  CALCIUM 8.6 8.8*  AST 48* 54*  ALT 19 22  ALKPHOS 190* 181*  BILITOT 1.3* 1.6*   ------------------------------------------------------------------------------------------------------------------  ------------------------------------------------------------------------------------------------------------------ GFR: Estimated Creatinine Clearance: 52.5 mL/min (A) (by C-G formula based on SCr of 1.58 mg/dL (H)). Liver Function Tests: Recent Labs  Lab 02/28/22 1136 02/28/22 1516  AST 48* 54*  ALT 19 22  ALKPHOS 190* 181*  BILITOT 1.3* 1.6*  PROT 7.6 7.8  ALBUMIN 3.2* 3.0*   No results for input(s): "LIPASE", "AMYLASE" in the last 168 hours. No results for input(s): "  AMMONIA" in the last 168 hours. Coagulation Profile: No results for input(s): "INR", "PROTIME" in the last 168 hours. Cardiac Enzymes: No results for input(s): "CKTOTAL", "CKMB", "CKMBINDEX", "TROPONINI" in the last 168 hours. BNP (last 3 results) No results for input(s): "PROBNP" in the last 8760 hours. HbA1C: No results for input(s): "HGBA1C" in the last 72 hours. CBG: No  results for input(s): "GLUCAP" in the last 168 hours. Lipid Profile: No results for input(s): "CHOL", "HDL", "LDLCALC", "TRIG", "CHOLHDL", "LDLDIRECT" in the last 72 hours. Thyroid Function Tests: No results for input(s): "TSH", "T4TOTAL", "FREET4", "T3FREE", "THYROIDAB" in the last 72 hours. Anemia Panel: No results for input(s): "VITAMINB12", "FOLATE", "FERRITIN", "TIBC", "IRON", "RETICCTPCT" in the last 72 hours.  --------------------------------------------------------------------------------------------------------------- Urine analysis:    Component Value Date/Time   COLORURINE YELLOW 02/16/2022 [place] 02/16/2022 1345   LABSPEC 1.021 02/16/2022 1345   PHURINE 6.0 02/16/2022 1345   GLUCOSEU NEGATIVE 02/16/2022 1345   GLUCOSEU NEGATIVE 08/20/2018 1000   HGBUR SMALL (A) 02/16/2022 1345   BILIRUBINUR NEGATIVE 02/16/2022 1345   KETONESUR NEGATIVE 02/16/2022 1345   PROTEINUR NEGATIVE 02/16/2022 1345   UROBILINOGEN 2.0 (A) 08/20/2018 1000   NITRITE NEGATIVE 02/16/2022 1345   LEUKOCYTESUR NEGATIVE 02/16/2022 1345      Imaging Results:       Assessment & Plan:    Principal Problem:   GI bleed   Recurrent anemia-patient's hemoglobin is 8.1 on discharge on 02/20/2022, now hemoglobin dropped to 6.6.  1 unit PRBC has been ordered.  Likely from GI bleed.  EGD and colonoscopy done 2 weeks ago were mainly unremarkable except for hemorrhoids and diverticulosis seen on colonoscopy.  Follow CBC in a.m. ?  GI bleed-patient denies any overt bleeding or black stools.  Called and discussed with Dr. Lyndel Safe, he recommends to consult [facility] and start Protonix 40 mg IV every 12 hours.  Will  start patient on clear liquid diet.  Zofran as needed for nausea and vomiting. Alcohol dependence-patient has history of alcohol dependence.  No signs and symptoms of alcohol withdrawal at this time.  We will initiate CIWA protocol Hypertension-blood pressure mildly elevated, patient is not on  medications at home.  Start p.o. hydralazine as needed   DVT Prophylaxis-   SCDs  AM Labs Ordered, also please review Full Orders  Family Communication: Admission, patients condition and plan of care including tests being ordered have been discussed with the patient who indicate understanding and agree with the plan and Code Status.  Code Status: Full code  Admission status: Inpatient :The appropriate admission status for this patient is INPATIENT. Inpatient status is judged to be reasonable and necessary in order to provide the required intensity of service to ensure the patient's safety. The patient's presenting symptoms, physical exam findings, and initial radiographic and laboratory data in the context of their chronic comorbidities is felt to place them at high risk for further clinical deterioration. Furthermore, it is not anticipated that the patient will be medically stable for discharge from the hospital within 2 midnights of admission. The following factors support the admission status of inpatient.            * I certify that at the point of admission it is my clinical judgment that the patient will require inpatient hospital care spanning beyond 2 midnights from the point of admission due to high intensity of service, high risk for further deterioration and high frequency of surveillance required.*  Time spent in minutes : 60 min  [place]

## 2022-03-01 ENCOUNTER — Encounter (HOSPITAL_COMMUNITY): Payer: Self-pay | Admitting: Family Medicine

## 2022-03-01 ENCOUNTER — Other Ambulatory Visit: Payer: Self-pay

## 2022-03-01 DIAGNOSIS — D500 Iron deficiency anemia secondary to blood loss (chronic): Secondary | ICD-10-CM

## 2022-03-01 DIAGNOSIS — K922 Gastrointestinal hemorrhage, unspecified: Secondary | ICD-10-CM | POA: Diagnosis not present

## 2022-03-01 DIAGNOSIS — D638 Anemia in other chronic diseases classified elsewhere: Secondary | ICD-10-CM

## 2022-03-01 DIAGNOSIS — J9601 Acute respiratory failure with hypoxia: Secondary | ICD-10-CM | POA: Diagnosis not present

## 2022-03-01 LAB — COMPREHENSIVE METABOLIC PANEL
ALT: 23 U/L (ref 0–44)
AST: 59 U/L — ABNORMAL HIGH (ref 15–41)
Albumin: 3.2 g/dL — ABNORMAL LOW (ref 3.5–5.0)
Alkaline Phosphatase: 191 U/L — ABNORMAL HIGH (ref 38–126)
Anion gap: 9 (ref 5–15)
BUN: 15 mg/dL (ref 8–23)
CO2: 20 mmol/L — ABNORMAL LOW (ref 22–32)
Calcium: 8.8 mg/dL — ABNORMAL LOW (ref 8.9–10.3)
Chloride: 107 mmol/L (ref 98–111)
Creatinine, Ser: 1.53 mg/dL — ABNORMAL HIGH (ref 0.61–1.24)
GFR, Estimated: 51 mL/min — ABNORMAL LOW (ref >60)
Glucose, Bld: 146 mg/dL — ABNORMAL HIGH (ref 70–99)
Potassium: 4 mmol/L (ref 3.5–5.1)
Sodium: 136 mmol/L (ref 135–145)
Total Bilirubin: 2.9 mg/dL — ABNORMAL HIGH (ref 0.3–1.2)
Total Protein: 8.7 g/dL — ABNORMAL HIGH (ref 6.5–8.1)

## 2022-03-01 LAB — BPAM RBC
Blood Product Expiration Date: 202307042359
ISSUE DATE / TIME: 202306121718
Unit Type and Rh: 6200

## 2022-03-01 LAB — CBC
HCT: 27.3 % — ABNORMAL LOW (ref 39.0–52.0)
Hemoglobin: 8.6 g/dL — ABNORMAL LOW (ref 13.0–17.0)
MCH: 28.6 pg (ref 26.0–34.0)
MCHC: 31.5 g/dL (ref 30.0–36.0)
MCV: 90.7 fL (ref 80.0–100.0)
Platelets: 232 10*3/uL (ref 150–400)
RBC: 3.01 MIL/uL — ABNORMAL LOW (ref 4.22–5.81)
RDW: 18.1 % — ABNORMAL HIGH (ref 11.5–15.5)
WBC: 8.3 10*3/uL (ref 4.0–10.5)
nRBC: 0 % (ref 0.0–0.2)

## 2022-03-01 LAB — TYPE AND SCREEN
ABO/RH(D): A POS
Antibody Screen: NEGATIVE
Unit division: 0

## 2022-03-01 MED ORDER — PEG-KCL-NACL-NASULF-NA ASC-C 100 G PO SOLR
0.5000 | Freq: Once | ORAL | Status: AC
  Administered 2022-03-01: 100 g via ORAL
  Filled 2022-03-01: qty 1

## 2022-03-01 MED ORDER — BISACODYL 5 MG PO TBEC
20.0000 mg | DELAYED_RELEASE_TABLET | Freq: Once | ORAL | Status: AC
Start: 2022-03-01 — End: 2022-03-01
  Administered 2022-03-01: 20 mg via ORAL
  Filled 2022-03-01: qty 4

## 2022-03-01 MED ORDER — PEG-KCL-NACL-NASULF-NA ASC-C 100 G PO SOLR: 1.0000 | Freq: Once | ORAL | Status: DC

## 2022-03-01 MED ORDER — PEG-KCL-NACL-NASULF-NA ASC-C 100 G PO SOLR
0.5000 | Freq: Once | ORAL | Status: AC
  Administered 2022-03-02: 100 g via ORAL
  Filled 2022-03-01: qty 1

## 2022-03-01 MED ORDER — LINACLOTIDE 145 MCG PO CAPS
145.0000 ug | ORAL_CAPSULE | Freq: Once | ORAL | Status: AC
  Administered 2022-03-01: 145 ug via ORAL
  Filled 2022-03-01: qty 1

## 2022-03-01 NOTE — Progress Notes (Addendum)
[facility]  PROGRESS NOTE  Tirso Laws PTW:[id number] DOB: [birth year] DOA: 02/28/2022 PCP: Cassandria Anger, MD   Brief HPI:    66 y.o. male, with past medical history of GERD, alcohol abuse, chronic liver disease, alcoholic polyneuropathy, thiamine deficiency, vitamin D deficiency, zinc deficiency who was recently discharged from the hospital after he was admitted for symptomatic anemia.  Required blood transfusion.  Underwent EGD and colonoscopy.  Hemorrhoids were found on the exam, poor preparation for colonoscopy noted.  Also showed diverticulosis in the sigmoid colon in the descending and ascending colon.  EGD showed esophageal varices, gastritis which was biopsied. Patient was sent home on iron supplementation along with Protonix. Today patient went to Dr. Lyndel Safe clinic for follow-up as outpatient and was found to have hemoglobin of 6.6.  Patient was sent to ED for blood transfusion.    Subjective   Patient seen and examined, denies abdominal pain.  No bloody stools.   Assessment/Plan:   Recurrent anemia-patient's hemoglobin was 8.1 on discharge on 02/20/2022, hemoglobin dropped to 6.6 in GI office.  Was sent to ED for further evaluation.  1 unit PRBC was given.  Likely from GI bleed.  EGD and colonoscopy done 2 weeks ago were mainly unremarkable except for hemorrhoids and diverticulosis seen on colonoscopy.  GI has seen the patient, plan to repeat colonoscopy in a.m.  ?  GI bleed-patient denies any overt bleeding or black stools.  Patient started on IV Protonix 40 mg every 12 hours.  Started on clear liquid diet.  [facility]  has seen the patient and plan for colonoscopy in a.m.  Alcohol dependence-patient has history of alcohol dependence.  No signs and symptoms of alcohol withdrawal at this time.  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  New finding.  GI following  Hypertension-blood pressure mildly elevated, patient is not on  medications at home.  Start p.o. hydralazine as needed  CKD stage IIIa-creatinine 1.53.  At baseline    Medications     bisacodyl  20 mg Oral Once   folic acid  1 mg Oral Daily   linaclotide  145 mcg Oral Once   multivitamin with minerals  1 tablet Oral Daily   pantoprazole (PROTONIX) IV  40 mg Intravenous Q12H   [START ON 03/02/2022] peg 3350 powder  0.5 kit Oral Once   peg 3350 powder  0.5 kit Oral Once   thiamine  100 mg Oral Daily   Or   thiamine  100 mg Intravenous Daily     Data Reviewed:   CBG:  No results for input(s): "GLUCAP" in the last 168 hours.  SpO2: 100 %    Vitals:   03/01/22 0808 03/01/22 0827 03/01/22 0855 03/01/22 1405  BP: 124/79 132/81 132/81 (!) 149/89  Pulse: 90 87 87 80  Resp: '18 17 17 17  ' Temp: 98.2 F (36.8 C) 98.5 F (36.9 C) 98.5 F (36.9 C) 98 F (36.7 C)  TempSrc:  Oral Oral Oral  SpO2: 100% 100%  100%  Weight:   79.2 kg   Height:   6' (1.829 m)       Data Reviewed:  Basic Metabolic Panel: Recent Labs  Lab 02/28/22 1136 02/28/22 1516 03/01/22 1115  NA 137 140 136  K 3.6 3.6 4.0  CL 108 112* 107  CO2 20 20* 20*  GLUCOSE 114* 145* 146*  BUN '13 16 15  ' CREATININE 1.49 1.58* 1.53*  CALCIUM 8.6 8.8* 8.8*    CBC: Recent Labs  Lab 02/28/22 1136 02/28/22 1516 03/01/22 1115  WBC 7.4 7.2 8.3  NEUTROABS 5.5  --   --   HGB 6.6 Repeated and verified X2.* 6.4* 8.6*  HCT 20.1 Repeated and verified X2.* 20.6* 27.3*  MCV 88.5 92.4 90.7  PLT 203.0 212 232    LFT Recent Labs  Lab 02/28/22 1136 02/28/22 1516 03/01/22 1115  AST 48* 54* 59*  ALT '19 22 23  ' ALKPHOS 190* 181* 191*  BILITOT 1.3* 1.6* 2.9*  PROT 7.6 7.8 8.7*  ALBUMIN 3.2* 3.0* 3.2*     Antibiotics: Anti-infectives (From admission, onward)    None        DVT prophylaxis: SCDs  Code Status: Full code  Family Communication: No family at bedside   CONSULTS    Objective    Physical Examination:   General-appears in no acute  distress Heart-S1-S2, regular, no murmur auscultated Lungs-clear to auscultation bilaterally, no wheezing or crackles auscultated Abdomen-soft, nontender, no organomegaly Extremities-no edema in the lower extremities Neuro-alert, oriented x3, no focal deficit noted  Status is: Inpatient: GI bleed        [place]   [facility] If 7PM-7AM, please contact night-coverage at [email or web address], Office  [phone]   03/01/2022, 5:07 PM  LOS: 1 day

## 2022-03-01 NOTE — Anesthesia Preprocedure Evaluation (Signed)
Anesthesia Evaluation  Patient identified by MRN, date of birth, ID band Patient awake    Reviewed: Allergy & Precautions, NPO status , Patient's Chart, lab work & pertinent test results  Airway Mallampati: II  TM Distance: >3 FB Neck ROM: Full    Dental no notable dental hx. (+) Edentulous Upper, Missing, Dental Advisory Given,    Pulmonary Current Smoker and Patient abstained from smoking.,    Pulmonary exam normal breath sounds clear to auscultation       Cardiovascular hypertension, Normal cardiovascular exam Rhythm:Regular Rate:Normal     Neuro/Psych    GI/Hepatic GERD  ,(+)     substance abuse  alcohol use,   Endo/Other    Renal/GU      Musculoskeletal   Abdominal   Peds  Hematology  (+) Blood dyscrasia, anemia ,   Anesthesia Other Findings Doxycycline , omeprazole, propranolol  Reproductive/Obstetrics                           Anesthesia Physical Anesthesia Plan  ASA: 3  Anesthesia Plan: MAC   Post-op Pain Management:    Induction:   PONV Risk Score and Plan: Treatment may vary due to age or medical condition  Airway Management Planned: Natural Airway and Nasal Cannula  Additional Equipment: None  Intra-op Plan:   Post-operative Plan:   Informed Consent: I have reviewed the patients History and Physical, chart, labs and discussed the procedure including the risks, benefits and alternatives for the proposed anesthesia with the patient or authorized representative who has indicated his/her understanding and acceptance.     Dental advisory given  Plan Discussed with: CRNA  Anesthesia Plan Comments:        Anesthesia Quick Evaluation

## 2022-03-01 NOTE — Plan of Care (Signed)

## 2022-03-01 NOTE — H&P (View-Only) (Signed)
Consultation  Referring Provider: [facility]/ [place] Primary Care Physician:  Cassandria Anger, MD Primary Gastroenterologist:  Dr.Gupta  Reason for Consultation: Severe anemia, dizziness  HPI: Johnny Mcknight is a 66 y.o. male recently known to GI from admission on 02/17/2022 at which time he presented with hemoglobin of 4 and episodes of syncope and frequent falls at home. Work-up at that time with CT angiography showed an enlarged nodular liver, possible faint liver lesions. Profoundly iron deficiency with ferritin of 8/serum iron 13.  Received iron infusion, and 4 units of packed RBCs during that admission. He underwent EGD on 02/18/2022 with finding of 2 columns of grade 0-1 varices, mild gastritis and otherwise negative exam. Colonoscopy on 02/18/2022 with grade 4 prolapsed internal hemorrhoids, there was a 15 mm polyp removed from the distal ascending colon-path showed this to be a tubovillous adenoma, stalk margin completely excised.  Multiple diverticuli noted and less than adequate prep of the right colon.  Plan was for repeat colonoscopy in 3 months as an outpatient.  Repeat CT on 02/18/2022 with and without IV contrast as patient did not feel that he could complete an MRI to further assess the liver.  He says he is severely claustrophobic and has woken up twice with prior MRIs and has been unable to complete. This showed a markedly heterogeneous arterial enhancement and distribution of underlying vasculature and is of portal hypertension and splenomegaly and short-term interval follow-up was recommended AFP normal  Hemoglobin was 8.1 on discharge 02/20/2022 Patient had routine labs planned through our office which were done yesterday-hemoglobin down to 6.6/hematocrit 20.1  Patient was advised to return to the hospital.  He says he has been feeling okay since he was discharged last time, remains fatigued but has not had any further syncope or falls He has not been taking any aspirin or NSAIDs at  home.  Has no complaints of abdominal discomfort.  He has not noted any melena or hematochezia.  He was transfused 1 unit-and follow-up hemoglobin pending this more   Past Medical History:  Diagnosis Date   GERD (gastroesophageal reflux disease)    Polyneuropathy, alcoholic ([place]) 09/24/1094   Gabapentin prn d/c B complex po 11/22 Re-start B complex po Risks associated with treatment noncompliance were discussed. Compliance was encouraged. [place]   Streptococcal pneumonia ([place]) 09/27/2015   1/17   Thiamine deficiency 10/02/2020   Alcohol related.  On B complex Risks associated with treatment noncompliance were discussed. Compliance was encouraged.    Vitamin D deficiency 05/14/2018   2019   Zinc deficiency 04/03/2020   2021 Zinc 220 mg bid Risks associated with treatment noncompliance were discussed. Compliance was encouraged.    Past Surgical History:  Procedure Laterality Date   BIOPSY  02/18/2022   Procedure: BIOPSY;  Surgeon: Jackquline Denmark, MD;  Location: [facility];  Service: Gastroenterology;;   COLONOSCOPY WITH PROPOFOL N/A 02/18/2022   Procedure: COLONOSCOPY WITH PROPOFOL;  Surgeon: Jackquline Denmark, MD;  Location: [facility];  Service: Gastroenterology;  Laterality: N/A;   ESOPHAGOGASTRODUODENOSCOPY (EGD) WITH PROPOFOL N/A 02/18/2022   Procedure: ESOPHAGOGASTRODUODENOSCOPY (EGD) WITH PROPOFOL;  Surgeon: Jackquline Denmark, MD;  Location: [facility];  Service: Gastroenterology;  Laterality: N/A;   POLYPECTOMY  02/18/2022   Procedure: POLYPECTOMY;  Surgeon: Jackquline Denmark, MD;  Location: [facility];  Service: Gastroenterology;;   TEE WITHOUT CARDIOVERSION N/A 09/29/2015   Procedure: TRANSESOPHAGEAL ECHOCARDIOGRAM (TEE);  Surgeon: Jerline Pain, MD;  Location: [facility] ENDOSCOPY;  Service: Cardiovascular;  Laterality: N/A;    Prior to  Admission medications   Medication Sig Start Date End Date Taking? Authorizing Provider  ferrous sulfate 325 (65 FE) MG tablet Take 1 tablet (325 mg total) by mouth 2  (two) times daily with a meal. 02/20/22 04/21/22 Yes Ogbata, Babs Bertin, MD  folic acid (FOLVITE) 1 MG tablet Take 1 tablet (1 mg total) by mouth daily. 02/21/22 04/22/22 Yes Bonnell Public, MD  hydrocortisone (ANUSOL-HC) 2.5 % rectal cream Place rectally 2 (two) times daily. Patient taking differently: Place 1 application  rectally 2 (two) times daily as needed for hemorrhoids. 02/20/22  Yes Bonnell Public, MD  Multiple Vitamin (MULTIVITAMIN WITH MINERALS) TABS tablet Take 1 tablet by mouth daily. 02/21/22 04/22/22 Yes Dana Allan I, MD  pantoprazole (PROTONIX) 40 MG tablet Take 1 tablet (40 mg total) by mouth daily. 02/21/22 04/22/22 Yes Dana Allan I, MD  thiamine 100 MG tablet Take 1 tablet (100 mg total) by mouth daily. 02/20/22 04/21/22 Yes Dana Allan I, MD  zinc sulfate 50 MG CAPS capsule Take 1 capsule (220 mg total) by mouth 2 (two) times daily. 02/20/22 04/21/22 Yes Bonnell Public, MD    Current Facility-Administered Medications  Medication Dose Route Frequency Provider Last Rate Last Admin   acetaminophen (TYLENOL) tablet 650 mg  650 mg Oral Q6H PRN Oswald Hillock, MD       Or   acetaminophen (TYLENOL) suppository 650 mg  650 mg Rectal Q6H PRN Darrick Meigs, Marge Duncans, MD       folic acid (FOLVITE) tablet 1 mg  1 mg Oral Daily Darrick Meigs, Marge Duncans, MD       hydrALAZINE (APRESOLINE) tablet 25 mg  25 mg Oral Q6H PRN Darrick Meigs, Marge Duncans, MD       LORazepam (ATIVAN) tablet 1-4 mg  1-4 mg Oral Q1H PRN Oswald Hillock, MD       Or   LORazepam (ATIVAN) injection 1-4 mg  1-4 mg Intravenous Q1H PRN Oswald Hillock, MD       multivitamin with minerals tablet 1 tablet  1 tablet Oral Daily Darrick Meigs, Marge Duncans, MD       ondansetron [facility]) tablet 4 mg  4 mg Oral Q6H PRN Oswald Hillock, MD       Or   ondansetron [facility]) injection 4 mg  4 mg Intravenous Q6H PRN Oswald Hillock, MD       pantoprazole (PROTONIX) injection 40 mg  40 mg Intravenous Q12H Oswald Hillock, MD   40 mg at 02/28/22 2352   thiamine tablet 100 mg  100  mg Oral Daily Oswald Hillock, MD       Or   thiamine (B-1) injection 100 mg  100 mg Intravenous Daily Oswald Hillock, MD        Allergies as of 02/28/2022 - Review Complete 02/28/2022  Allergen Reaction Noted   Doxycycline  11/19/2008   Omeprazole-sodium bicarbonate  01/29/2008   Propranolol  06/03/2013    Family History  Problem Relation Age of Onset   Hypertension Other    Heart disease Father 17       MI   Hypertension Father    Hypertension Mother    Diabetes Mother    Diabetes Maternal Aunt    Stroke Maternal Aunt    Diabetes Maternal Grandmother     Social History   Socioeconomic History   Marital status: Widowed    Spouse name: Not on file   Number of children: Not on file   Years of  education: Not on file   Highest education level: Not on file  Occupational History   Occupation: Disabled.  Tobacco Use   Smoking status: Some Days    Types: Pipe   Smokeless tobacco: Never  Vaping Use   Vaping Use: Never used  Substance and Sexual Activity   Alcohol use: Yes    Comment: vodka 1/2 pint a day   Drug use: No   Sexual activity: Yes  Other Topics Concern   Not on file  Social History Narrative   Lives with wife.     Social Determinants of Health   Financial Resource Strain: Low Risk  (05/27/2021)   Overall Financial Resource Strain (CARDIA)    Difficulty of Paying Living Expenses: Not hard at all  Food Insecurity: No Food Insecurity (05/27/2021)   Hunger Vital Sign    Worried About Running Out of Food in the Last Year: Never true    Ran Out of Food in the Last Year: Never true  Transportation Needs: No Transportation Needs (05/27/2021)   PRAPARE - Hydrologist (Medical): No    Lack of Transportation (Non-Medical): No  Physical Activity: Inactive (05/27/2021)   Exercise Vital Sign    Days of Exercise per Week: 0 days    Minutes of Exercise per Session: 0 min  Stress: No Stress Concern Present (05/27/2021)   [place]    Feeling of Stress : Not at all  Social Connections: Moderately Integrated (05/27/2021)   Social Connection and Isolation Panel [NHANES]    Frequency of Communication with Friends and Family: More than three times a week    Frequency of Social Gatherings with Friends and Family: More than three times a week    Attends Religious Services: 1 to 4 times per year    Active Member of [employer] or Organizations: No    Attends Archivist Meetings: 1 to 4 times per year    Marital Status: Widowed  Intimate Partner Violence: Not At Risk (05/27/2021)   Humiliation, Afraid, Rape, and Kick questionnaire    Fear of Current or Ex-Partner: No    Emotionally Abused: No    Physically Abused: No    Sexually Abused: No    Review of Systems: Pertinent positive and negative review of systems were noted in the above HPI section.  All other review of systems was otherwise negative.   Physical Exam: Vital signs in last 24 hours: Temp:  [97.1 F (36.2 C)-98.6 F (37 C)] 98.5 F (36.9 C) (06/13 0827) Pulse Rate:  [82-104] 87 (06/13 0827) Resp:  [13-21] 17 (06/13 0827) BP: (121-157)/(71-110) 132/81 (06/13 0827) SpO2:  [99 %-100 %] 100 % (06/13 0827) Last BM Date : 02/27/22 General:   Alert,  Well-developed, well-nourished, older African-American male pleasant and cooperative in NAD Head:  Normocephalic and atraumatic. Eyes:  Sclera clear, no icterus.   Conjunctiva pale Ears:  Normal auditory acuity. Nose:  No deformity, discharge,  or lesions. Mouth:  No deformity or lesions.   Neck:  Supple; no masses or thyromegaly. Lungs:  Clear throughout to auscultation.   No wheezes, crackles, or rhonchi.  Heart:  Regular rate and rhythm; no murmurs, clicks, rubs,  or gallops. Abdomen:  Soft,nontender, BS active,nonpalp mass or hsm.   Rectal: Not done Msk:  Symmetrical without gross deformities. . Pulses:  Normal pulses noted. Extremities:  Without  clubbing or edema. Neurologic:  Alert and  oriented x4;  grossly normal neurologically. Skin:  Intact without significant lesions or rashes.. Psych:  Alert and cooperative. Normal mood and affect.  Intake/Output from previous day: 06/12 0701 - 06/13 0700 In: 378.2 [I.V.:63.2; Blood:315] Out: 1400 [Urine:1400] Intake/Output this shift: No intake/output data recorded.  Lab Results: Recent Labs    02/28/22 1136 02/28/22 1516  WBC 7.4 7.2  HGB 6.6 Repeated and verified X2.* 6.4*  HCT 20.1 Repeated and verified X2.* 20.6*  PLT 203.0 212   BMET Recent Labs    02/28/22 1136 02/28/22 1516  NA 137 140  K 3.6 3.6  CL 108 112*  CO2 20 20*  GLUCOSE 114* 145*  BUN 13 16  CREATININE 1.49 1.58*  CALCIUM 8.6 8.8*   LFT Recent Labs    02/28/22 1516  PROT 7.8  ALBUMIN 3.0*  AST 54*  ALT 22  ALKPHOS 181*  BILITOT 1.6*   PT/INR No results for input(s): "LABPROT", "INR" in the last 72 hours. Hepatitis Panel No results for input(s): "HEPBSAG", "HCVAB", "HEPAIGM", "HEPBIGM" in the last 72 hours.    IMPRESSION:  #58 66 year old African-American male readmitted yesterday after outpatient labs showed hemoglobin of 6.6.  Patient had admission about 2 weeks ago with profound anemia, hemoglobin 4 and severe iron deficiency.  He required 4 units of packed RBCs at that time and had IV iron infusion. Endoscopic work-up with EGD and colonoscopy last admission with finding of grade 0-1 esophageal varices, no stigmata of bleeding and mild gastritis and a colonoscopy grade 4 prolapsed hemorrhoids, a 15 mm polyp was removed which was a tubovillous adenoma with stalk margin completely excised.  Less than adequate prep of the right colon  With continued drift in hemoglobin and concern for continued slow GI blood loss patient will need repeat colonoscopy to better assess the right colon, and if no findings in the colon then will need capsule endoscopy, or enteroscopy.  #2 chronic EtOH  use/abuse #3 history of polyneuropathy 4.  GERD 5.  New finding of cirrhosis, last admission, decompensated with portal hypertension and splenomegaly as well as small esophageal varices at EGD Abnormal hepatic imaging, normal AFP He will need MRI as an outpatient-he is adamant that he cannot have an MRI even with sedation, hopefully we can get him scheduled in an open MRI setting.  M ELD=14  PLAN: Clear liquid diet today Patient will be scheduled for repeat colonoscopy with Dr. Tarri Glenn tomorrow afternoon Bowel prep today, will also give him an extra 1 L of movie prep early tomorrow morning to assure good prep.  Continue to trend hemoglobin, transfuse as indicated GI will follow with you   Nassim Cosma PA-C 03/01/2022, 8:46 AM

## 2022-03-01 NOTE — Consult Note (Signed)
Consultation  Referring Provider: [facility]/ [place] Primary Care Physician:  Cassandria Anger, MD Primary Gastroenterologist:  Dr.Gupta  Reason for Consultation: Severe anemia, dizziness  HPI: Johnny Mcknight is a 66 y.o. male recently known to GI from admission on 02/17/2022 at which time he presented with hemoglobin of 4 and episodes of syncope and frequent falls at home. Work-up at that time with CT angiography showed an enlarged nodular liver, possible faint liver lesions. Profoundly iron deficiency with ferritin of 8/serum iron 13.  Received iron infusion, and 4 units of packed RBCs during that admission. He underwent EGD on 02/18/2022 with finding of 2 columns of grade 0-1 varices, mild gastritis and otherwise negative exam. Colonoscopy on 02/18/2022 with grade 4 prolapsed internal hemorrhoids, there was a 15 mm polyp removed from the distal ascending colon-path showed this to be a tubovillous adenoma, stalk margin completely excised.  Multiple diverticuli noted and less than adequate prep of the right colon.  Plan was for repeat colonoscopy in 3 months as an outpatient.  Repeat CT on 02/18/2022 with and without IV contrast as patient did not feel that he could complete an MRI to further assess the liver.  He says he is severely claustrophobic and has woken up twice with prior MRIs and has been unable to complete. This showed a markedly heterogeneous arterial enhancement and distribution of underlying vasculature and is of portal hypertension and splenomegaly and short-term interval follow-up was recommended AFP normal  Hemoglobin was 8.1 on discharge 02/20/2022 Patient had routine labs planned through our office which were done yesterday-hemoglobin down to 6.6/hematocrit 20.1  Patient was advised to return to the hospital.  He says he has been feeling okay since he was discharged last time, remains fatigued but has not had any further syncope or falls He has not been taking any aspirin or NSAIDs at  home.  Has no complaints of abdominal discomfort.  He has not noted any melena or hematochezia.  He was transfused 1 unit-and follow-up hemoglobin pending this more   Past Medical History:  Diagnosis Date   GERD (gastroesophageal reflux disease)    Polyneuropathy, alcoholic ([place]) 0/09/7492   Gabapentin prn d/c B complex po 11/22 Re-start B complex po Risks associated with treatment noncompliance were discussed. Compliance was encouraged. [place]   Streptococcal pneumonia ([place]) 09/27/2015   1/17   Thiamine deficiency 10/02/2020   Alcohol related.  On B complex Risks associated with treatment noncompliance were discussed. Compliance was encouraged.    Vitamin D deficiency 05/14/2018   2019   Zinc deficiency 04/03/2020   2021 Zinc 220 mg bid Risks associated with treatment noncompliance were discussed. Compliance was encouraged.    Past Surgical History:  Procedure Laterality Date   BIOPSY  02/18/2022   Procedure: BIOPSY;  Surgeon: Jackquline Denmark, MD;  Location: [facility];  Service: Gastroenterology;;   COLONOSCOPY WITH PROPOFOL N/A 02/18/2022   Procedure: COLONOSCOPY WITH PROPOFOL;  Surgeon: Jackquline Denmark, MD;  Location: [facility];  Service: Gastroenterology;  Laterality: N/A;   ESOPHAGOGASTRODUODENOSCOPY (EGD) WITH PROPOFOL N/A 02/18/2022   Procedure: ESOPHAGOGASTRODUODENOSCOPY (EGD) WITH PROPOFOL;  Surgeon: Jackquline Denmark, MD;  Location: [facility];  Service: Gastroenterology;  Laterality: N/A;   POLYPECTOMY  02/18/2022   Procedure: POLYPECTOMY;  Surgeon: Jackquline Denmark, MD;  Location: [facility];  Service: Gastroenterology;;   TEE WITHOUT CARDIOVERSION N/A 09/29/2015   Procedure: TRANSESOPHAGEAL ECHOCARDIOGRAM (TEE);  Surgeon: Jerline Pain, MD;  Location: [facility]COPY;  Service: Cardiovascular;  Laterality: N/A;    Prior to  Admission medications   Medication Sig Start Date End Date Taking? Authorizing Provider  ferrous sulfate 325 (65 FE) MG tablet Take 1 tablet (325 mg total) by mouth 2  (two) times daily with a meal. 02/20/22 04/21/22 Yes Ogbata, Babs Bertin, MD  folic acid (FOLVITE) 1 MG tablet Take 1 tablet (1 mg total) by mouth daily. 02/21/22 04/22/22 Yes Bonnell Public, MD  hydrocortisone (ANUSOL-HC) 2.5 % rectal cream Place rectally 2 (two) times daily. Patient taking differently: Place 1 application  rectally 2 (two) times daily as needed for hemorrhoids. 02/20/22  Yes Bonnell Public, MD  Multiple Vitamin (MULTIVITAMIN WITH MINERALS) TABS tablet Take 1 tablet by mouth daily. 02/21/22 04/22/22 Yes Dana Allan I, MD  pantoprazole (PROTONIX) 40 MG tablet Take 1 tablet (40 mg total) by mouth daily. 02/21/22 04/22/22 Yes Dana Allan I, MD  thiamine 100 MG tablet Take 1 tablet (100 mg total) by mouth daily. 02/20/22 04/21/22 Yes Dana Allan I, MD  zinc sulfate 50 MG CAPS capsule Take 1 capsule (220 mg total) by mouth 2 (two) times daily. 02/20/22 04/21/22 Yes Bonnell Public, MD    Current Facility-Administered Medications  Medication Dose Route Frequency Provider Last Rate Last Admin   acetaminophen (TYLENOL) tablet 650 mg  650 mg Oral Q6H PRN Oswald Hillock, MD       Or   acetaminophen (TYLENOL) suppository 650 mg  650 mg Rectal Q6H PRN Darrick Meigs, Marge Duncans, MD       folic acid (FOLVITE) tablet 1 mg  1 mg Oral Daily Darrick Meigs, Marge Duncans, MD       hydrALAZINE (APRESOLINE) tablet 25 mg  25 mg Oral Q6H PRN Darrick Meigs, Marge Duncans, MD       LORazepam (ATIVAN) tablet 1-4 mg  1-4 mg Oral Q1H PRN Oswald Hillock, MD       Or   LORazepam (ATIVAN) injection 1-4 mg  1-4 mg Intravenous Q1H PRN Oswald Hillock, MD       multivitamin with minerals tablet 1 tablet  1 tablet Oral Daily Darrick Meigs, Marge Duncans, MD       ondansetron [facility]) tablet 4 mg  4 mg Oral Q6H PRN Oswald Hillock, MD       Or   ondansetron [facility]) injection 4 mg  4 mg Intravenous Q6H PRN Oswald Hillock, MD       pantoprazole (PROTONIX) injection 40 mg  40 mg Intravenous Q12H Oswald Hillock, MD   40 mg at 02/28/22 2352   thiamine tablet 100 mg  100  mg Oral Daily Oswald Hillock, MD       Or   thiamine (B-1) injection 100 mg  100 mg Intravenous Daily Oswald Hillock, MD        Allergies as of 02/28/2022 - Review Complete 02/28/2022  Allergen Reaction Noted   Doxycycline  11/19/2008   Omeprazole-sodium bicarbonate  01/29/2008   Propranolol  06/03/2013    Family History  Problem Relation Age of Onset   Hypertension Other    Heart disease Father 25       MI   Hypertension Father    Hypertension Mother    Diabetes Mother    Diabetes Maternal Aunt    Stroke Maternal Aunt    Diabetes Maternal Grandmother     Social History   Socioeconomic History   Marital status: Widowed    Spouse name: Not on file   Number of children: Not on file   Years of  education: Not on file   Highest education level: Not on file  Occupational History   Occupation: Disabled.  Tobacco Use   Smoking status: Some Days    Types: Pipe   Smokeless tobacco: Never  Vaping Use   Vaping Use: Never used  Substance and Sexual Activity   Alcohol use: Yes    Comment: vodka 1/2 pint a day   Drug use: No   Sexual activity: Yes  Other Topics Concern   Not on file  Social History Narrative   Lives with wife.     Social Determinants of Health   Financial Resource Strain: Low Risk  (05/27/2021)   Overall Financial Resource Strain (CARDIA)    Difficulty of Paying Living Expenses: Not hard at all  Food Insecurity: No Food Insecurity (05/27/2021)   Hunger Vital Sign    Worried About Running Out of Food in the Last Year: Never true    Ran Out of Food in the Last Year: Never true  Transportation Needs: No Transportation Needs (05/27/2021)   PRAPARE - Hydrologist (Medical): No    Lack of Transportation (Non-Medical): No  Physical Activity: Inactive (05/27/2021)   Exercise Vital Sign    Days of Exercise per Week: 0 days    Minutes of Exercise per Session: 0 min  Stress: No Stress Concern Present (05/27/2021)   [place]    Feeling of Stress : Not at all  Social Connections: Moderately Integrated (05/27/2021)   Social Connection and Isolation Panel [NHANES]    Frequency of Communication with Friends and Family: More than three times a week    Frequency of Social Gatherings with Friends and Family: More than three times a week    Attends Religious Services: 1 to 4 times per year    Active Member of [employer] or Organizations: No    Attends Archivist Meetings: 1 to 4 times per year    Marital Status: Widowed  Intimate Partner Violence: Not At Risk (05/27/2021)   Humiliation, Afraid, Rape, and Kick questionnaire    Fear of Current or Ex-Partner: No    Emotionally Abused: No    Physically Abused: No    Sexually Abused: No    Review of Systems: Pertinent positive and negative review of systems were noted in the above HPI section.  All other review of systems was otherwise negative.   Physical Exam: Vital signs in last 24 hours: Temp:  [97.1 F (36.2 C)-98.6 F (37 C)] 98.5 F (36.9 C) (06/13 0827) Pulse Rate:  [82-104] 87 (06/13 0827) Resp:  [13-21] 17 (06/13 0827) BP: (121-157)/(71-110) 132/81 (06/13 0827) SpO2:  [99 %-100 %] 100 % (06/13 0827) Last BM Date : 02/27/22 General:   Alert,  Well-developed, well-nourished, older African-American male pleasant and cooperative in NAD Head:  Normocephalic and atraumatic. Eyes:  Sclera clear, no icterus.   Conjunctiva pale Ears:  Normal auditory acuity. Nose:  No deformity, discharge,  or lesions. Mouth:  No deformity or lesions.   Neck:  Supple; no masses or thyromegaly. Lungs:  Clear throughout to auscultation.   No wheezes, crackles, or rhonchi.  Heart:  Regular rate and rhythm; no murmurs, clicks, rubs,  or gallops. Abdomen:  Soft,nontender, BS active,nonpalp mass or hsm.   Rectal: Not done Msk:  Symmetrical without gross deformities. . Pulses:  Normal pulses noted. Extremities:  Without  clubbing or edema. Neurologic:  Alert and  oriented x4;  grossly normal neurologically. Skin:  Intact without significant lesions or rashes.. Psych:  Alert and cooperative. Normal mood and affect.  Intake/Output from previous day: 06/12 0701 - 06/13 0700 In: 378.2 [I.V.:63.2; Blood:315] Out: 1400 [Urine:1400] Intake/Output this shift: No intake/output data recorded.  Lab Results: Recent Labs    02/28/22 1136 02/28/22 1516  WBC 7.4 7.2  HGB 6.6 Repeated and verified X2.* 6.4*  HCT 20.1 Repeated and verified X2.* 20.6*  PLT 203.0 212   BMET Recent Labs    02/28/22 1136 02/28/22 1516  NA 137 140  K 3.6 3.6  CL 108 112*  CO2 20 20*  GLUCOSE 114* 145*  BUN 13 16  CREATININE 1.49 1.58*  CALCIUM 8.6 8.8*   LFT Recent Labs    02/28/22 1516  PROT 7.8  ALBUMIN 3.0*  AST 54*  ALT 22  ALKPHOS 181*  BILITOT 1.6*   PT/INR No results for input(s): "LABPROT", "INR" in the last 72 hours. Hepatitis Panel No results for input(s): "HEPBSAG", "HCVAB", "HEPAIGM", "HEPBIGM" in the last 72 hours.    IMPRESSION:  #25 66 year old African-American male readmitted yesterday after outpatient labs showed hemoglobin of 6.6.  Patient had admission about 2 weeks ago with profound anemia, hemoglobin 4 and severe iron deficiency.  He required 4 units of packed RBCs at that time and had IV iron infusion. Endoscopic work-up with EGD and colonoscopy last admission with finding of grade 0-1 esophageal varices, no stigmata of bleeding and mild gastritis and a colonoscopy grade 4 prolapsed hemorrhoids, a 15 mm polyp was removed which was a tubovillous adenoma with stalk margin completely excised.  Less than adequate prep of the right colon  With continued drift in hemoglobin and concern for continued slow GI blood loss patient will need repeat colonoscopy to better assess the right colon, and if no findings in the colon then will need capsule endoscopy, or enteroscopy.  #2 chronic EtOH  use/abuse #3 history of polyneuropathy 4.  GERD 5.  New finding of cirrhosis, last admission, decompensated with portal hypertension and splenomegaly as well as small esophageal varices at EGD Abnormal hepatic imaging, normal AFP He will need MRI as an outpatient-he is adamant that he cannot have an MRI even with sedation, hopefully we can get him scheduled in an open MRI setting.  M ELD=14  PLAN: Clear liquid diet today Patient will be scheduled for repeat colonoscopy with Dr. Tarri Glenn tomorrow afternoon Bowel prep today, will also give him an extra 1 L of movie prep early tomorrow morning to assure good prep.  Continue to trend hemoglobin, transfuse as indicated GI will follow with you   Simon Llamas PA-C 03/01/2022, 8:46 AM

## 2022-03-02 ENCOUNTER — Inpatient Hospital Stay (HOSPITAL_COMMUNITY): Payer: Medicare HMO | Admitting: Anesthesiology

## 2022-03-02 ENCOUNTER — Encounter (HOSPITAL_COMMUNITY): Payer: Self-pay | Admitting: Family Medicine

## 2022-03-02 ENCOUNTER — Encounter (HOSPITAL_COMMUNITY)
Admission: EM | Disposition: A | Discharge status: Home Health | Payer: Self-pay | Source: Home / Self Care | Attending: Internal Medicine

## 2022-03-02 DIAGNOSIS — K644 Residual hemorrhoidal skin tags: Secondary | ICD-10-CM

## 2022-03-02 DIAGNOSIS — K643 Fourth degree hemorrhoids: Secondary | ICD-10-CM

## 2022-03-02 DIAGNOSIS — K5730 Diverticulosis of large intestine without perforation or abscess without bleeding: Secondary | ICD-10-CM

## 2022-03-02 DIAGNOSIS — K922 Gastrointestinal hemorrhage, unspecified: Secondary | ICD-10-CM | POA: Diagnosis not present

## 2022-03-02 DIAGNOSIS — D638 Anemia in other chronic diseases classified elsewhere: Secondary | ICD-10-CM | POA: Diagnosis not present

## 2022-03-02 DIAGNOSIS — K633 Ulcer of intestine: Secondary | ICD-10-CM

## 2022-03-02 HISTORY — PX: BIOPSY: SHX5522

## 2022-03-02 HISTORY — PX: COLONOSCOPY WITH PROPOFOL: SHX5780

## 2022-03-02 SURGERY — COLONOSCOPY WITH PROPOFOL
Anesthesia: Monitor Anesthesia Care

## 2022-03-02 SURGERY — INVASIVE LAB ABORTED CASE

## 2022-03-02 MED ORDER — PROPOFOL 10 MG/ML IV BOLUS
INTRAVENOUS | Status: DC | PRN
  Administered 2022-03-02: 80 mg via INTRAVENOUS
  Administered 2022-03-02: 10 mg via INTRAVENOUS
  Administered 2022-03-02 (×2): 20 mg via INTRAVENOUS
  Administered 2022-03-02: 10 mg via INTRAVENOUS

## 2022-03-02 MED ORDER — PHENYLEPHRINE HCL (PRESSORS) 10 MG/ML IV SOLN
INTRAVENOUS | Status: DC | PRN
  Administered 2022-03-02: 100 ug via INTRAVENOUS

## 2022-03-02 MED ORDER — PROPOFOL 1000 MG/100ML IV EMUL
INTRAVENOUS | Status: AC
  Filled 2022-03-02: qty 100

## 2022-03-02 MED ORDER — PROPOFOL 500 MG/50ML IV EMUL
INTRAVENOUS | Status: DC | PRN
  Administered 2022-03-02: 125 ug/kg/min via INTRAVENOUS

## 2022-03-02 MED ORDER — LACTATED RINGERS IV SOLN
INTRAVENOUS | Status: DC
  Administered 2022-03-02: 1000 mL via INTRAVENOUS

## 2022-03-02 MED ORDER — LIDOCAINE 2% (20 MG/ML) 5 ML SYRINGE
INTRAMUSCULAR | Status: DC | PRN
  Administered 2022-03-02: 40 mg via INTRAVENOUS

## 2022-03-02 SURGICAL SUPPLY — 22 items

## 2022-03-02 NOTE — Progress Notes (Signed)
[facility]  PROGRESS NOTE  Johnny Mcknight MBW:[id number] DOB: 1956-04-12 DOA: 02/28/2022 PCP: Cassandria Anger, MD   Brief HPI:    66 y.o. male, with past medical history of GERD, alcohol abuse, chronic liver disease, alcoholic polyneuropathy, thiamine deficiency, vitamin D deficiency, zinc deficiency who was recently discharged from the hospital after he was admitted for symptomatic anemia.  Required blood transfusion.  Underwent EGD and colonoscopy.  Hemorrhoids were found on the exam, poor preparation for colonoscopy noted.  Also showed diverticulosis in the sigmoid colon in the descending and ascending colon.  EGD showed esophageal varices, gastritis which was biopsied. Patient was sent home on iron supplementation along with Protonix. Patient went to Dr. Lyndel Safe clinic for follow-up as outpatient and was found to have hemoglobin of 6.6.   Plan for repeat colonoscopy 6/14    Subjective   Did say he was having loose stools at home.  Somewhat "sticky"   Assessment/Plan:   Recurrent anemia -patient's hemoglobin was 8.1 on discharge on 02/20/2022, hemoglobin dropped to 6.6 in GI office.  -1 unit PRBC was given.   - EGD and colonoscopy done 2 weeks ago were mainly unremarkable except for hemorrhoids and diverticulosis seen on colonoscopy.   -GI has seen the patient, plan to repeat colonoscopy 6/14  GI bleed- -colonoscopy planned for 6/14  Alcohol dependence-patient has history of alcohol dependence.  No signs and symptoms of alcohol withdrawal at this time.  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   - GI following  Hypertension-blood pressure mildly elevated, patient is not on medications at home.  Start p.o. hydralazine as needed  CKD stage IIIa-creatinine 1.53.  At baseline    Medications     folic acid  1 mg Oral Daily   multivitamin with minerals  1 tablet Oral Daily   pantoprazole (PROTONIX) IV  40 mg Intravenous Q12H   thiamine  100 mg  Oral Daily   Or   thiamine  100 mg Intravenous Daily     Data Reviewed:   CBG:  No results for input(s): "GLUCAP" in the last 168 hours.  SpO2: 100 %    Vitals:   03/01/22 1405 03/01/22 2100 03/02/22 0155 03/02/22 0608  BP: (!) 149/89 128/85 124/85 (!) 141/91  Pulse: 80 88 88 83  Resp: '17 18 18 18  '$ Temp: 98 F (36.7 C) 97.6 F (36.4 C) 98.5 F (36.9 C) 98 F (36.7 C)  TempSrc: Oral Oral Oral Oral  SpO2: 100% 100% 100% 100%  Weight:      Height:          Data Reviewed:  Basic Metabolic Panel: Recent Labs  Lab 02/28/22 1136 02/28/22 1516 03/01/22 1115  NA 137 140 136  K 3.6 3.6 4.0  CL 108 112* 107  CO2 20 20* 20*  GLUCOSE 114* 145* 146*  BUN '13 16 15  '$ CREATININE 1.49 1.58* 1.53*  CALCIUM 8.6 8.8* 8.8*    CBC: Recent Labs  Lab 02/28/22 1136 02/28/22 1516 03/01/22 1115  WBC 7.4 7.2 8.3  NEUTROABS 5.5  --   --   HGB 6.6 Repeated and verified X2.* 6.4* 8.6*  HCT 20.1 Repeated and verified X2.* 20.6* 27.3*  MCV 88.5 92.4 90.7  PLT 203.0 212 232    LFT Recent Labs  Lab 02/28/22 1136 02/28/22 1516 03/01/22 1115  AST 48* 54* 59*  ALT '19 22 23  '$ ALKPHOS 190* 181* 191*  BILITOT 1.3* 1.6* 2.9*  PROT 7.6 7.8 8.7*  ALBUMIN 3.2* 3.0* 3.2*     Antibiotics: Anti-infectives (From admission, onward)    None        DVT prophylaxis: SCDs  Code Status: Full code     CONSULTS  GI   Objective    Physical Examination:   General: Appearance:    Well developed, well nourished male in no acute distress- sitting on bedside commode     Lungs:      respirations unlabored  Heart:    Normal heart rate.   MS:   All extremities are intact.   Neurologic:   Awake, alert     Status is: Inpatient: GI bleed        [name]   [facility] If 7PM-7AM, please contact night-coverage at [email or web address], Office  [phone]   03/02/2022, 8:41 AM  LOS: 2 days

## 2022-03-02 NOTE — Anesthesia Postprocedure Evaluation (Signed)
Anesthesia Post Note  Patient: Barron Vanloan  Procedure(s) Performed: COLONOSCOPY WITH PROPOFOL BIOPSY     Patient location during evaluation: Endoscopy Anesthesia Type: MAC Level of consciousness: awake and alert Pain management: pain level controlled Vital Signs Assessment: post-procedure vital signs reviewed and stable Respiratory status: spontaneous breathing, nonlabored ventilation, respiratory function stable and patient connected to nasal cannula oxygen Cardiovascular status: blood pressure returned to baseline and stable Postop Assessment: no apparent nausea or vomiting Anesthetic complications: no   No notable events documented.  Last Vitals:  Vitals:   03/02/22 1447 03/02/22 1449  BP: (!) 126/58 122/83  Pulse: 82 79  Resp: 20 (!) 28  Temp: (!) 36.3 C   SpO2: 100% 100%    Last Pain:  Vitals:   03/02/22 1449  TempSrc:   PainSc: 0-No pain                 Barnet Glasgow

## 2022-03-02 NOTE — Transfer of Care (Signed)
Immediate Anesthesia Transfer of Care Note  Patient: Lorren Splawn  Procedure(s) Performed: COLONOSCOPY WITH PROPOFOL BIOPSY  Patient Location: PACU  Anesthesia Type:MAC  Level of Consciousness: awake, alert , oriented and patient cooperative  Airway & Oxygen Therapy: Patient Spontanous Breathing and Patient connected to face mask oxygen  Post-op Assessment: Report given to RN and Post -op Vital signs reviewed and stable  Post vital signs: Reviewed and stable  Last Vitals:  Vitals Value Taken Time  BP    Temp    Pulse 80 03/02/22 1447  Resp 39 03/02/22 1447  SpO2 100 % 03/02/22 1447  Vitals shown include unvalidated device data.  Last Pain:  Vitals:   03/02/22 1329  TempSrc:   PainSc: 0-No pain         Complications: No notable events documented.

## 2022-03-02 NOTE — Interval H&P Note (Signed)
History and Physical Interval Note:  03/02/2022 2:07 PM  Johnny Mcknight  has presented today for surgery, with the diagnosis of Profound anemia, heme positive stool, iron deficiency.  The various methods of treatment have been discussed with the patient and family. After consideration of risks, benefits and other options for treatment, the patient has consented to  Procedure(s): COLONOSCOPY WITH PROPOFOL (N/A) as a surgical intervention.  The patient's history has been reviewed, patient examined, no change in status, stable for surgery.  I have reviewed the patient's chart and labs.  Questions were answered to the patient's satisfaction.     [place]

## 2022-03-02 NOTE — Plan of Care (Signed)

## 2022-03-02 NOTE — H&P (View-Only) (Signed)
Patient ID: Johnny Mcknight, male   DOB: 1956/02/23, 66 y.o.   MRN: [id number]    Progress Note   Subjective   Day # 2 CC; severe anemia recurrent ,dizziness  Hgb 6.6 on admit -> 8.6 post transfusions  Undergoing extended bowel prep -he was able to complete another 800 cc of movi prep early this morning, vomited some of it Stool is watery clear Feels okay, tired   Objective   Vital signs in last 24 hours: Temp:  [97.6 F (36.4 C)-98.5 F (36.9 C)] 98 F (36.7 C) (06/14 0630) Pulse Rate:  [80-88] 83 (06/14 0608) Resp:  [17-18] 18 (06/14 0608) BP: (124-149)/(85-91) 141/91 (06/14 1601) SpO2:  [100 %] 100 % (06/14 0608) Last BM Date : 03/02/22 General: Older African-American male in NAD Heart:  Regular rate and rhythm; no murmurs Lungs: Respirations even and unlabored, lungs CTA bilaterally Abdomen:  Soft, nontender and nondistended. Normal bowel sounds. Extremities:  Without edema. Neurologic:  Alert and oriented,  grossly normal neurologically. Psych:  Cooperative. Normal mood and affect.  Intake/Output from previous day: 06/13 0701 - 06/14 0700 In: 400 [P.O.:400] Out: -  Intake/Output this shift: No intake/output data recorded.  Lab Results: Recent Labs    02/28/22 1136 02/28/22 1516 03/01/22 1115  WBC 7.4 7.2 8.3  HGB 6.6 Repeated and verified X2.* 6.4* 8.6*  HCT 20.1 Repeated and verified X2.* 20.6* 27.3*  PLT 203.0 212 232   BMET Recent Labs    02/28/22 1136 02/28/22 1516 03/01/22 1115  NA 137 140 136  K 3.6 3.6 4.0  CL 108 112* 107  CO2 20 20* 20*  GLUCOSE 114* 145* 146*  BUN '13 16 15  '$ CREATININE 1.49 1.58* 1.53*  CALCIUM 8.6 8.8* 8.8*   LFT Recent Labs    03/01/22 1115  PROT 8.7*  ALBUMIN 3.2*  AST 59*  ALT 23  ALKPHOS 191*  BILITOT 2.9*   PT/INR No results for input(s): "LABPROT", "INR" in the last 72 hours.       Assessment / Plan:    #52 66 year old African-American male readmitted 03/01/2022 after outpatient labs showed hemoglobin  6.6.  Patient was admitted about 2 weeks ago with profound anemia, hemoglobin of 4 and severe iron deficiency.  He had work-up with EGD and colonoscopy at that time with finding of grade 0-1 esophageal varices, no stigmata of active bleeding, and mild gastritis At colonoscopy grade 4 prolapsed hemorrhoids, 15 mm polyp removed which was a tubovillous adenoma, and inadequate prep of the right colon-plan was for repeat colonoscopy outpatient in about 3 months  With continued drift in hemoglobin patient has been reprepped and plan is for repeat colonoscopy today If colonoscopy is unrevealing we will proceed with capsule endoscopy  Hemoglobin stable post transfusions x 2  #2 chronic EtOH use/abuse #3 history of polyneuropathy #4 new diagnosis of cirrhosis, decompensated with portal hypertension and splenomegaly, small varices at EGD Abnormal hepatic imaging-normal AFP Patient unable to tolerate being in an MR I machine even with sedation-need to try to arrange MRI as an outpatient with open MRI after discharge  Current M ELD= 14     Principal Problem:   GI bleed Active Problems:   Bacteremia due to Streptococcus pneumoniae   Acute respiratory failure with hypoxia ([place])   Alcohol dependence with other alcohol-induced disorder ([facility])   Anemia of chronic disease   Iron deficiency anemia due to chronic blood loss     LOS: 2 days   Breyer Tejera  PA-C 03/02/2022,  9:16 AM

## 2022-03-02 NOTE — Op Note (Signed)
[facility] Patient Name: Johnny Mcknight Procedure Date: 03/02/2022 MRN: [id number] Attending MD: Thornton Park MD, MD Date of Birth: 1956-01-10 CSN: [id number] Age: 66 Admit Type: Inpatient Procedure:                Colonoscopy Indications:              Unexplained iron deficiency anemia not explained by                            EGD or limited colonoscopy due to poor prep 02/18/22 Providers:                Thornton Park MD, MD, Ladoris Gene, RN,                            William Dalton, Technician, Darliss Cheney, Technician Referring MD:              Medicines:                Monitored Anesthesia Care Complications:            No immediate complications. Estimated Blood Loss:     Estimated blood loss was minimal. Procedure:                Pre-Anesthesia Assessment:                           - Prior to the procedure, a History and Physical                            was performed, and patient medications and                            allergies were reviewed. The patient's tolerance of                            previous anesthesia was also reviewed. The risks                            and benefits of the procedure and the sedation                            options and risks were discussed with the patient.                            All questions were answered, and informed consent                            was obtained. Prior Anticoagulants: The patient has                            taken no previous anticoagulant or antiplatelet                            agents. ASA Grade Assessment: III - A patient with  severe systemic disease. After reviewing the risks                            and benefits, the patient was deemed in                            satisfactory condition to undergo the procedure.                           After obtaining informed consent, the colonoscope                            was passed under direct vision.  Throughout the                            procedure, the patient's blood pressure, pulse, and                            oxygen saturations were monitored continuously. The                            CF-HQ190L ([id number]) Olympus colonoscope was                            introduced through the anus and advanced to the the                            cecum, identified by appendiceal orifice and                            ileocecal valve. A second forward view of the right                            colon was performed. The colonoscopy was performed                            with moderate difficulty due to multiple                            diverticula in the colon, a redundant colon,                            significant looping and a tortuous colon. The                            patient tolerated the procedure well. The quality                            of the bowel preparation was good. The terminal                            ileum, ileocecal valve, appendiceal orifice, and  rectum were photographed. Scope In: 2:21:24 PM Scope Out: 2:39:28 PM Scope Withdrawal Time: 0 hours 9 minutes 23 seconds  Total Procedure Duration: 0 hours 18 minutes 4 seconds  Findings:      Non-bleeding internal and external hemorrhoids were found. The external       hemorrhoids were large and Grade IV (internal hemorrhoids that prolapse       and cannot be reduced manually).      Multiple small and large-mouthed diverticula were found in the entire       colon. No evidence for active diverticulitis or segmental colitis       associated with diverticulosis.      A single (solitary) four mm ulcer was found in the distal ascending       colon. No bleeding was present. No stigmata of recent bleeding were       seen. Biopsies were taken with a cold forceps for histology. Estimated       blood loss was minimal. Impression:               - Non-bleeding internal and external hemorrhoids.                            - Diverticulosis in the entire examined colon.                           - A single (solitary) ulcer in the distal ascending                            colon. Biopsied. This is likely to be his prior                            polypectomy site from 02/18/22.                           - Source of iron deficiency was not identified on                            this study. Moderate Sedation:      Not Applicable - Patient had care per Anesthesia. Recommendation:           - Return patient to hospital ward for ongoing care.                           - NPO until capsule endoscopy. Equipment will be                            available at 4pm today.                           - Continue present medications.                           - Await pathology results.                           - Routine post-capsule endoscopy orders. Procedure Code(s):        --- Professional ---  45380, Colonoscopy, flexible; with biopsy, single                            or multiple Diagnosis Code(s):        --- Professional ---                           K64.3, Fourth degree hemorrhoids                           K64.4, Residual hemorrhoidal skin tags                           K63.3, Ulcer of intestine                           D50.9, Iron deficiency anemia, unspecified                           K57.30, Diverticulosis of large intestine without                            perforation or abscess without bleeding CPT copyright 2019 American [employer]. All rights reserved. The codes documented in this report are preliminary and upon coder review may  be revised to meet current compliance requirements. Thornton Park MD, MD 03/02/2022 3:00:12 PM This report has been signed electronically. Number of Addenda: 0

## 2022-03-02 NOTE — Progress Notes (Addendum)
Patient ID: Johnny Mcknight, male   DOB: 12/29/1955, 66 y.o.   MRN: [id number]    Progress Note   Subjective   Day # 2 CC; severe anemia recurrent ,dizziness  Hgb 6.6 on admit -> 8.6 post transfusions  Undergoing extended bowel prep -he was able to complete another 800 cc of movi prep early this morning, vomited some of it Stool is watery clear Feels okay, tired   Objective   Vital signs in last 24 hours: Temp:  [97.6 F (36.4 C)-98.5 F (36.9 C)] 98 F (36.7 C) (06/14 1660) Pulse Rate:  [80-88] 83 (06/14 0608) Resp:  [17-18] 18 (06/14 0608) BP: (124-149)/(85-91) 141/91 (06/14 6301) SpO2:  [100 %] 100 % (06/14 0608) Last BM Date : 03/02/22 General: Older African-American male in NAD Heart:  Regular rate and rhythm; no murmurs Lungs: Respirations even and unlabored, lungs CTA bilaterally Abdomen:  Soft, nontender and nondistended. Normal bowel sounds. Extremities:  Without edema. Neurologic:  Alert and oriented,  grossly normal neurologically. Psych:  Cooperative. Normal mood and affect.  Intake/Output from previous day: 06/13 0701 - 06/14 0700 In: 400 [P.O.:400] Out: -  Intake/Output this shift: No intake/output data recorded.  Lab Results: Recent Labs    02/28/22 1136 02/28/22 1516 03/01/22 1115  WBC 7.4 7.2 8.3  HGB 6.6 Repeated and verified X2.* 6.4* 8.6*  HCT 20.1 Repeated and verified X2.* 20.6* 27.3*  PLT 203.0 212 232   BMET Recent Labs    02/28/22 1136 02/28/22 1516 03/01/22 1115  NA 137 140 136  K 3.6 3.6 4.0  CL 108 112* 107  CO2 20 20* 20*  GLUCOSE 114* 145* 146*  BUN '13 16 15  '$ CREATININE 1.49 1.58* 1.53*  CALCIUM 8.6 8.8* 8.8*   LFT Recent Labs    03/01/22 1115  PROT 8.7*  ALBUMIN 3.2*  AST 59*  ALT 23  ALKPHOS 191*  BILITOT 2.9*   PT/INR No results for input(s): "LABPROT", "INR" in the last 72 hours.       Assessment / Plan:    #76 65 year old African-American male readmitted 03/01/2022 after outpatient labs showed hemoglobin  6.6.  Patient was admitted about 2 weeks ago with profound anemia, hemoglobin of 4 and severe iron deficiency.  He had work-up with EGD and colonoscopy at that time with finding of grade 0-1 esophageal varices, no stigmata of active bleeding, and mild gastritis At colonoscopy grade 4 prolapsed hemorrhoids, 15 mm polyp removed which was a tubovillous adenoma, and inadequate prep of the right colon-plan was for repeat colonoscopy outpatient in about 3 months  With continued drift in hemoglobin patient has been reprepped and plan is for repeat colonoscopy today If colonoscopy is unrevealing we will proceed with capsule endoscopy  Hemoglobin stable post transfusions x 2  #2 chronic EtOH use/abuse #3 history of polyneuropathy #4 new diagnosis of cirrhosis, decompensated with portal hypertension and splenomegaly, small varices at EGD Abnormal hepatic imaging-normal AFP Patient unable to tolerate being in an MR I machine even with sedation-need to try to arrange MRI as an outpatient with open MRI after discharge  Current M ELD= 14     Principal Problem:   GI bleed Active Problems:   Bacteremia due to Streptococcus pneumoniae   Acute respiratory failure with hypoxia ([place])   Alcohol dependence with other alcohol-induced disorder (HCC)   Anemia of chronic disease   Iron deficiency anemia due to chronic blood loss     LOS: 2 days   Marien Manship  PA-C 03/02/2022,  9:16 AM

## 2022-03-02 NOTE — Plan of Care (Signed)
Plan of care discussed.   

## 2022-03-03 ENCOUNTER — Encounter (HOSPITAL_COMMUNITY): Payer: Self-pay | Admitting: Family Medicine

## 2022-03-03 ENCOUNTER — Inpatient Hospital Stay (HOSPITAL_COMMUNITY): Payer: Medicare HMO | Admitting: Certified Registered"

## 2022-03-03 ENCOUNTER — Inpatient Hospital Stay: Payer: Medicare HMO | Admitting: Internal Medicine

## 2022-03-03 ENCOUNTER — Encounter (HOSPITAL_COMMUNITY)
Admission: EM | Disposition: A | Discharge status: Home Health | Payer: Self-pay | Source: Home / Self Care | Attending: Internal Medicine

## 2022-03-03 DIAGNOSIS — K279 Peptic ulcer, site unspecified, unspecified as acute or chronic, without hemorrhage or perforation: Secondary | ICD-10-CM

## 2022-03-03 DIAGNOSIS — I8510 Secondary esophageal varices without bleeding: Secondary | ICD-10-CM

## 2022-03-03 DIAGNOSIS — D509 Iron deficiency anemia, unspecified: Secondary | ICD-10-CM

## 2022-03-03 DIAGNOSIS — J449 Chronic obstructive pulmonary disease, unspecified: Secondary | ICD-10-CM

## 2022-03-03 DIAGNOSIS — K766 Portal hypertension: Secondary | ICD-10-CM

## 2022-03-03 DIAGNOSIS — F10288 Alcohol dependence with other alcohol-induced disorder: Secondary | ICD-10-CM

## 2022-03-03 DIAGNOSIS — F17210 Nicotine dependence, cigarettes, uncomplicated: Secondary | ICD-10-CM

## 2022-03-03 DIAGNOSIS — I10 Essential (primary) hypertension: Secondary | ICD-10-CM

## 2022-03-03 HISTORY — PX: ESOPHAGOGASTRODUODENOSCOPY (EGD) WITH PROPOFOL: SHX5813

## 2022-03-03 HISTORY — PX: GIVENS CAPSULE STUDY: SHX5432

## 2022-03-03 LAB — BASIC METABOLIC PANEL
Anion gap: 7 (ref 5–15)
BUN: 15 mg/dL (ref 8–23)
CO2: 17 mmol/L — ABNORMAL LOW (ref 22–32)
Calcium: 9 mg/dL (ref 8.9–10.3)
Chloride: 114 mmol/L — ABNORMAL HIGH (ref 98–111)
Creatinine, Ser: 1.6 mg/dL — ABNORMAL HIGH (ref 0.61–1.24)
GFR, Estimated: 48 mL/min — ABNORMAL LOW (ref >60)
Glucose, Bld: 91 mg/dL (ref 70–99)
Potassium: 3.7 mmol/L (ref 3.5–5.1)
Sodium: 138 mmol/L (ref 135–145)

## 2022-03-03 LAB — CBC
HCT: 23.4 % — ABNORMAL LOW (ref 39.0–52.0)
Hemoglobin: 7.5 g/dL — ABNORMAL LOW (ref 13.0–17.0)
MCH: 28.7 pg (ref 26.0–34.0)
MCHC: 32.1 g/dL (ref 30.0–36.0)
MCV: 89.7 fL (ref 80.0–100.0)
Platelets: 209 10*3/uL (ref 150–400)
RBC: 2.61 MIL/uL — ABNORMAL LOW (ref 4.22–5.81)
RDW: 17.4 % — ABNORMAL HIGH (ref 11.5–15.5)
WBC: 7.4 10*3/uL (ref 4.0–10.5)
nRBC: 0 % (ref 0.0–0.2)

## 2022-03-03 LAB — SURGICAL PATHOLOGY

## 2022-03-03 SURGERY — ESOPHAGOGASTRODUODENOSCOPY (EGD) WITH PROPOFOL
Anesthesia: Monitor Anesthesia Care

## 2022-03-03 MED ORDER — PHENYLEPHRINE 80 MCG/ML (10ML) SYRINGE FOR IV PUSH (FOR BLOOD PRESSURE SUPPORT)
PREFILLED_SYRINGE | INTRAVENOUS | Status: DC | PRN
  Administered 2022-03-03: 80 ug via INTRAVENOUS

## 2022-03-03 MED ORDER — LACTATED RINGERS IV SOLN: INTRAVENOUS | Status: DC | PRN

## 2022-03-03 MED ORDER — PROPOFOL 10 MG/ML IV BOLUS
INTRAVENOUS | Status: DC | PRN
  Administered 2022-03-03 (×2): 20 mg via INTRAVENOUS
  Administered 2022-03-03 (×2): 30 mg via INTRAVENOUS
  Administered 2022-03-03: 20 mg via INTRAVENOUS

## 2022-03-03 MED ORDER — LIDOCAINE 2% (20 MG/ML) 5 ML SYRINGE
INTRAMUSCULAR | Status: DC | PRN
  Administered 2022-03-03: 100 mg via INTRAVENOUS

## 2022-03-03 MED ORDER — PROPOFOL 500 MG/50ML IV EMUL
INTRAVENOUS | Status: DC | PRN
  Administered 2022-03-03: 125 ug/kg/min via INTRAVENOUS

## 2022-03-03 SURGICAL SUPPLY — 15 items

## 2022-03-03 NOTE — Transfer of Care (Signed)
Immediate Anesthesia Transfer of Care Note  Patient: Johnny Mcknight  Procedure(s) Performed: ESOPHAGOGASTRODUODENOSCOPY (EGD) WITH PROPOFOL GIVENS CAPSULE STUDY  Patient Location: PACU and Endoscopy Unit  Anesthesia Type:MAC  Level of Consciousness: sedated  Airway & Oxygen Therapy: Patient Spontanous Breathing and Patient connected to face mask oxygen  Post-op Assessment: Report given to RN and Post -op Vital signs reviewed and stable  Post vital signs: Reviewed and stable  Last Vitals:  Vitals Value Taken Time  BP    Temp    Pulse 85 03/03/22 1300  Resp 16 03/03/22 1300  SpO2 100 % 03/03/22 1300  Vitals shown include unvalidated device data.  Last Pain:  Vitals:   03/03/22 1131  TempSrc: Temporal  PainSc: 0-No pain         Complications: No notable events documented.

## 2022-03-03 NOTE — TOC Initial Note (Signed)
Transition of Care [facility]) - Initial/Assessment Note    Patient Details  Name: Johnny Mcknight MRN: [id number] Date of Birth: 12/16/1955  Transition of Care [facility]) CM/SW Contact:    Lennart Pall, LCSW Phone Number: 03/03/2022, 2:26 PM  Clinical Narrative:                 Received TOC referral for pt to address SA issues.  Met with pt today to address these concerns and discuss other dc planning needs.  Pt confirms that when he was discharged ~ one week ago, he had been set up for HHPT/OT with [facility] and would want to continue this when he goes home.  I will ask for new orders when he is ready for dc.   Approached subject of his continued ETOH use and concerns that this is negatively affecting his overall health.  He nods "yes" but reports that he is not concerned about being able to stop "if they tell me I need to."  He is not interested in any resource information and notes "they gave me that stuff last time I was here."   CSW will continue to follow and alert [facility] for restart of [facility] services when ready for dc.  Expected Discharge Plan: [place] Barriers to Discharge: Continued Medical Work up   Patient Goals and CMS Choice Patient states their goals for this hospitalization and ongoing recovery are:: return home      Expected Discharge Plan and Services Expected Discharge Plan: [place] In-house Referral: Clinical Social Work     Living arrangements for the past 2 months: Single Family Home                                      Prior Living Arrangements/Services Living arrangements for the past 2 months: Single Family Home Lives with:: Siblings Patient language and need for interpreter reviewed:: Yes Do you feel safe going back to the place where you live?: Yes      Need for Family Participation in Patient Care: Yes (Comment) Care giver support system in place?: Yes (comment) Current home services: Home OT, Home PT, DME (with  [facility]) Criminal Activity/Legal Involvement Pertinent to Current Situation/Hospitalization: No - Comment as needed  Activities of Daily Living Home Assistive Devices/Equipment: Eyeglasses, Environmental consultant (specify type) ADL Screening (condition at time of admission) Patient's cognitive ability adequate to safely complete daily activities?: Yes Is the patient deaf or have difficulty hearing?: No Does the patient have difficulty seeing, even when wearing glasses/contacts?: No Does the patient have difficulty concentrating, remembering, or making decisions?: No Patient able to express need for assistance with ADLs?: Yes Does the patient have difficulty dressing or bathing?: No Independently performs ADLs?: Yes (appropriate for developmental age) Does the patient have difficulty walking or climbing stairs?: No Weakness of Legs: None Weakness of Arms/Hands: None  Permission Sought/Granted                  Emotional Assessment Appearance:: Appears stated age Attitude/Demeanor/Rapport: Engaged Affect (typically observed): Accepting Orientation: : Oriented to Self, Oriented to Place, Oriented to  Time, Oriented to Situation Alcohol / Substance Use: Alcohol Use Psych Involvement: No (comment)  Admission diagnosis:  GI bleed [K92.2] Anemia, unspecified type [D64.9] Patient Active Problem List   Diagnosis Date Noted   Colon ulcer    Iron deficiency anemia due to chronic blood  loss 03/01/2022   GI bleed 02/28/2022   Anemia 02/17/2022   Symptomatic anemia 02/16/2022   Hyperproteinemia 02/16/2022   Positive D dimer 02/16/2022   Hypoalbuminemia 02/16/2022   Skin rash 02/16/2022   Sciatic nerve disease, 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   Scaly skin 12/23/2020   Thiamine deficiency 27/25/3664   Alcoholic hepatitis 40/34/7425   Nausea 09/24/2020   Zinc deficiency 04/03/2020   Epistaxis 09/25/2019   Dry skin 08/20/2018   HTN (hypertension) 08/20/2018   B12 deficiency 05/14/2018   Vitamin D deficiency  05/14/2018   Atrophy of muscle of multiple sites 05/16/2017   Anemia associated with nutritional deficiency 05/16/2017   Polyneuropathy, alcoholic ([place]) 95/63/8756   Grief 12/02/2016   Hiccups 08/10/2016   Stress at work 08/10/2016   Neck pain 06/08/2016   Noncompliance 06/08/2016   Knee pain, bilateral 02/24/2016   Elevated BP 02/24/2016   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   CAP (community acquired pneumonia)    Streptococcal pneumonia ([place]) 09/27/2015   Bacteremia due to Streptococcus pneumoniae 09/27/2015   Acute respiratory failure with hypoxia ([place]) 09/27/2015   Alcohol dependence with other alcohol-induced disorder ([place]) 09/27/2015   Anemia of chronic disease 09/27/2015   Thrombocytopenia ([place]) 09/27/2015   Hypokalemia 09/22/2015   Hypomagnesemia 09/22/2015   Lumbago 05/10/2013   GERD 10/15/2007   PCP:  Cassandria Anger, MD Pharmacy:   [place], [place] [address] [place] [address] Phone: [phone] Fax: [phone]     Social Determinants of Health (SDOH) Interventions    Readmission Risk Interventions    03/03/2022    2:15 PM 02/18/2022   10:40 AM  Readmission Risk Prevention Plan  Post Dischage Appt  Complete  Medication Screening  Complete  Transportation Screening Complete Complete  PCP or Specialist Appt within 5-7 Days Complete   Home Care Screening Complete   Medication Review (RN CM) Complete

## 2022-03-03 NOTE — Interval H&P Note (Signed)
History and Physical Interval Note:  03/03/2022 12:11 PM  Johnny Mcknight  has presented today for surgery, with the diagnosis of severe anemia, iron deficiency.  The various methods of treatment have been discussed with the patient and family. After consideration of risks, benefits and other options for treatment, the patient has consented to  Procedure(s): ESOPHAGOGASTRODUODENOSCOPY (EGD) WITH PROPOFOL (N/A) and placement of video capsule for capsule endoscopy.  The patient's history has been reviewed, patient examined, no change in status, stable for surgery.  I have reviewed the patient's chart and labs.  Questions were answered to the patient's satisfaction.     Thornton Park

## 2022-03-03 NOTE — Anesthesia Preprocedure Evaluation (Signed)
Anesthesia Evaluation  Patient identified by MRN, date of birth, ID band Patient awake    Reviewed: Allergy & Precautions, NPO status , Patient's Chart, lab work & pertinent test results  History of Anesthesia Complications Negative for: history of anesthetic complications  Airway Mallampati: II  TM Distance: >3 FB Neck ROM: Full    Dental no notable dental hx. (+) Edentulous Upper, Missing, Dental Advisory Given,    Pulmonary COPD, Current Smoker and Patient abstained from smoking.,    breath sounds clear to auscultation       Cardiovascular hypertension,  Rhythm:Regular     Neuro/Psych  Neuromuscular disease    GI/Hepatic PUD, GERD  ,(+)     substance abuse  alcohol use,   Endo/Other    Renal/GU      Musculoskeletal   Abdominal   Peds  Hematology  (+) Blood dyscrasia, anemia ,   Anesthesia Other Findings Doxycycline , omeprazole, propranolol  Reproductive/Obstetrics                             Anesthesia Physical Anesthesia Plan  ASA: 3  Anesthesia Plan: MAC   Post-op Pain Management: Minimal or no pain anticipated   Induction: Intravenous  PONV Risk Score and Plan: Propofol infusion and Treatment may vary due to age or medical condition  Airway Management Planned: Nasal Cannula  Additional Equipment: None  Intra-op Plan:   Post-operative Plan:   Informed Consent: I have reviewed the patients History and Physical, chart, labs and discussed the procedure including the risks, benefits and alternatives for the proposed anesthesia with the patient or authorized representative who has indicated his/her understanding and acceptance.     Dental advisory given  Plan Discussed with: CRNA  Anesthesia Plan Comments:         Anesthesia Quick Evaluation

## 2022-03-03 NOTE — Progress Notes (Signed)
[facility]  PROGRESS NOTE  Johnny Mcknight DJM:[id number] DOB: 11-11-1955 DOA: 02/28/2022 PCP: Cassandria Anger, MD   Brief HPI:    66 y.o. male, with past medical history of GERD, alcohol abuse, chronic liver disease, alcoholic polyneuropathy, thiamine deficiency, vitamin D deficiency, zinc deficiency who was recently discharged from the hospital after he was admitted for symptomatic anemia.  Required blood transfusion.  Underwent EGD and colonoscopy.  Hemorrhoids were found on the exam, poor preparation for colonoscopy noted.  Also showed diverticulosis in the sigmoid colon in the descending and ascending colon.  EGD showed esophageal varices, gastritis which was biopsied. Patient was sent home on iron supplementation along with Protonix. Patient went to Dr. Lyndel Safe clinic for follow-up as outpatient and was found to have hemoglobin of 6.6.   S/p repeat colonoscopy 6/14.  Now getting capsule endoscopy    Subjective   Sleeping, no complaints except fatigue   Assessment/Plan:   Recurrent anemia -patient's hemoglobin was 8.1 on discharge on 02/20/2022, hemoglobin dropped to 6.6 in GI office.  -1 unit PRBC was given.   - EGD and colonoscopy done 2 weeks ago were mainly unremarkable except for hemorrhoids and diverticulosis seen on colonoscopy.   -GI has seen the patient- colonoscopy done now getting capsule endoscopy   GI bleed- -s/p colonoscopy planned  6/14 -no sign of bleeding so now getting capsule endoscopy  Alcohol dependence-patient has history of alcohol dependence.  No signs and symptoms of alcohol withdrawal at this time.  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   - GI following  Hypertension-blood pressure mildly elevated, patient is not on medications at home.  Start p.o. hydralazine as needed  CKD stage IIIa- -around baseline    Medications     folic acid  1 mg Oral Daily   multivitamin with minerals  1 tablet Oral Daily    pantoprazole (PROTONIX) IV  40 mg Intravenous Q12H   thiamine  100 mg Oral Daily   Or   thiamine  100 mg Intravenous Daily     Data Reviewed:   CBG:  No results for input(s): "GLUCAP" in the last 168 hours.  SpO2: 99 %    Vitals:   03/02/22 2145 03/03/22 0227 03/03/22 0636 03/03/22 1004  BP: (!) 145/75 128/80 127/82 120/77  Pulse: 89 83 86 77  Resp: '18 18 18 18  '$ Temp: 98.7 F (37.1 C) 99.1 F (37.3 C) 98.4 F (36.9 C) 98.4 F (36.9 C)  TempSrc: Oral Oral Oral Oral  SpO2: 100% 99% 99% 99%  Weight:      Height:          Data Reviewed:  Basic Metabolic Panel: Recent Labs  Lab 02/28/22 1136 02/28/22 1516 03/01/22 1115 03/03/22 0349  NA 137 140 136 138  K 3.6 3.6 4.0 3.7  CL 108 112* 107 114*  CO2 20 20* 20* 17*  GLUCOSE 114* 145* 146* 91  BUN '13 16 15 15  '$ CREATININE 1.49 1.58* 1.53* 1.60*  CALCIUM 8.6 8.8* 8.8* 9.0    CBC: Recent Labs  Lab 02/28/22 1136 02/28/22 1516 03/01/22 1115 03/03/22 0349  WBC 7.4 7.2 8.3 7.4  NEUTROABS 5.5  --   --   --   HGB 6.6 Repeated and verified X2.* 6.4* 8.6* 7.5*  HCT 20.1 Repeated and verified X2.* 20.6* 27.3* 23.4*  MCV 88.5 92.4 90.7 89.7  PLT 203.0 212 232 209    LFT Recent Labs  Lab 02/28/22 1136 02/28/22 1516 03/01/22 1115  AST 48* 54* 59*  ALT '19 22 23  '$ ALKPHOS 190* 181* 191*  BILITOT 1.3* 1.6* 2.9*  PROT 7.6 7.8 8.7*  ALBUMIN 3.2* 3.0* 3.2*     Antibiotics: Anti-infectives (From admission, onward)    None        DVT prophylaxis: SCDs  Code Status: Full code     CONSULTS  GI   Objective    Physical Examination:    General: Appearance:    Well developed, well nourished male in no acute distress     Lungs:     Clear to auscultation bilaterally, respirations unlabored  Heart:    Normal heart rate. Normal rhythm. No murmurs, rubs, or gallops.   MS:   All extremities are intact.   Neurologic:   Awake, alert, oriented x 3. No apparent focal neurological           defect.         Status is: Inpatient: GI bleed        [name]   [facility] If 7PM-7AM, please contact night-coverage at [email or web address], Office  [phone]   03/03/2022, 10:18 AM  LOS: 3 days

## 2022-03-03 NOTE — Progress Notes (Signed)
Givens capsule deployed during EGD 03/03/2022 at 1246. Study duration 12 hours, patient can remove equipment 03/04/22 at 0046. Endoscopy staff will collect equipment 03/04/22 AM.

## 2022-03-03 NOTE — Anesthesia Procedure Notes (Signed)
Date/Time: 03/03/2022 12:45 PM  Performed by: Cynda Familia, CRNAOxygen Delivery Method: Simple face mask Placement Confirmation: positive ETCO2 and breath sounds checked- equal and bilateral Dental Injury: Teeth and Oropharynx as per pre-operative assessment  Comments: Mask present upon report

## 2022-03-03 NOTE — Discharge Instructions (Signed)
YOU HAD AN ENDOSCOPIC PROCEDURE TODAY: Refer to the procedure report and other information in the discharge instructions given to you for any specific questions about what was found during the examination. If this information does not answer your questions, please call [place] office at [phone] to clarify.  ° °YOU SHOULD EXPECT: Some feelings of bloating in the abdomen. Passage of more gas than usual. Walking can help get rid of the air that was put into your GI tract during the procedure and reduce the bloating. If you had a lower endoscopy (such as a colonoscopy or flexible sigmoidoscopy) you may notice spotting of blood in your stool or on the toilet paper. Some abdominal soreness may be present for a day or two, also. ° °DIET: Your first meal following the procedure should be a light meal and then it is ok to progress to your normal diet. A half-sandwich or bowl of soup is an example of a good first meal. Heavy or fried foods are harder to digest and may make you feel nauseous or bloated. Drink plenty of fluids but you should avoid alcoholic beverages for 24 hours. If you had a esophageal dilation, please see attached instructions for diet.   ° °ACTIVITY: Your care partner should take you home directly after the procedure. You should plan to take it easy, moving slowly for the rest of the day. You can resume normal activity the day after the procedure however YOU SHOULD NOT DRIVE, use power tools, machinery or perform tasks that involve climbing or major physical exertion for 24 hours (because of the sedation medicines used during the test).  ° °SYMPTOMS TO REPORT IMMEDIATELY: °A gastroenterologist can be reached at any hour. Please call [phone]  for any of the following symptoms:  °Following lower endoscopy (colonoscopy, flexible sigmoidoscopy) °Excessive amounts of blood in the stool  °Significant tenderness, worsening of abdominal pains  °Swelling of the abdomen that is new, acute  °Fever of 100° or  higher  °Following upper endoscopy (EGD, EUS, ERCP, esophageal dilation) °Vomiting of blood or coffee ground material  °New, significant abdominal pain  °New, significant chest pain or pain under the shoulder blades  °Painful or persistently difficult swallowing  °New shortness of breath  °Black, tarry-looking or red, bloody stools ° °FOLLOW UP:  °If any biopsies were taken you will be contacted by phone or by letter within the next 1-3 weeks. Call [phone]  if you have not heard about the biopsies in 3 weeks.  °Please also call with any specific questions about appointments or follow up tests. ° °

## 2022-03-03 NOTE — Op Note (Addendum)
[facility] Patient Name: Johnny Mcknight Procedure Date: 03/03/2022 MRN: [id number] Attending MD: Thornton Park MD, MD Date of Birth: March 15, 1956 CSN: [id number] Age: 66 Admit Type: Outpatient Procedure:                Upper GI endoscopy Indications:              Unexplained iron deficiency anemia Providers:                Thornton Park MD, MD, Allayne Gitelman, RN, Despina Pole, Technician, Glenis Smoker, CRNA Referring MD:              Medicines:                Monitored Anesthesia Care Complications:            No immediate complications. Estimated Blood Loss:     Estimated blood loss: none. Procedure:                Pre-Anesthesia Assessment:                           - Prior to the procedure, a History and Physical                            was performed, and patient medications and                            allergies were reviewed. The patient's tolerance of                            previous anesthesia was also reviewed. The risks                            and benefits of the procedure and the sedation                            options and risks were discussed with the patient.                            All questions were answered, and informed consent                            was obtained. Prior Anticoagulants: The patient has                            taken no previous anticoagulant or antiplatelet                            agents. ASA Grade Assessment: III - A patient with                            severe systemic disease. After reviewing the risks  and benefits, the patient was deemed in                            satisfactory condition to undergo the procedure.                           After obtaining informed consent, the endoscope was                            passed under direct vision. Throughout the                            procedure, the patient's blood pressure, pulse, and                             oxygen saturations were monitored continuously. The                            GIF-H190 ([id number]) Olympus endoscope was introduced                            through the mouth, and advanced to the third part                            of duodenum. The upper GI endoscopy was                            accomplished without difficulty. The patient                            tolerated the procedure well. Scope In: Scope Out: Findings:      Prominent veins were present in the distal esophagus - consistent with       grade 0 varices were found in the lower third of the esophagus. Z-line       is located 40 cm from the incisors.      Mild portal hypertensive gastropathy was found in the gastric body. No       gastric varices or GAVE.      Localized mildly erythematous mucosa without bleeding was found in the       gastric antrum. This was biopsied 02/18/22 so I did not take additional       biopsies today.      The examined duodenum was normal.      Attempted to deploy the capsule endoscopy into the small intestines.       However, unable to advance the gastroscope through the pylorus when the       capsule was attached to the applicator. Attempted to both push it into       the duodenum with the gastroscope and unsuccessfully tried to use a roth       net to place it in the duodenum. I ended up leaving the capsule at the       antrum.      eft the capsule in the stomach.      The gastric and duodenal mucosa is friable. Scope trauma occurred with       any  scope contact. Impression:               - Very small/early esophageal varices.                           - Portal hypertensive gastropathy.                           - Gastritis.                           - Normal examined duodenum.                           - Capsule left in the antrum. Moderate Sedation:      Not Applicable - Patient had care per Anesthesia. Recommendation:           - NPO.                           -  Clear liquids in 2 hours.                           - Light snack in 4 hours.                           - Regular diet in 8 hours.                           - Continue present medications.                           - Anticipate capsule results being available in the                            next 24 to 48 hours.                           - Continue serial hgb/hct with transfusion as                            indicated. Procedure Code(s):        --- Professional ---                           [phone], Esophagogastroduodenoscopy, flexible,                            transoral; diagnostic, including collection of                            specimen(s) by brushing or washing, when performed                            (separate procedure) Diagnosis Code(s):        --- Professional ---                           I85.00, Esophageal  varices without bleeding                           K76.6, Portal hypertension                           K31.89, Other diseases of stomach and duodenum                           D50.9, Iron deficiency anemia, unspecified CPT copyright 2019 [employer]. All rights reserved. The codes documented in this report are preliminary and upon coder review may  be revised to meet current compliance requirements. Thornton Park MD, MD 03/03/2022 1:17:41 PM This report has been signed electronically. Number of Addenda: 0

## 2022-03-04 DIAGNOSIS — K922 Gastrointestinal hemorrhage, unspecified: Secondary | ICD-10-CM | POA: Diagnosis not present

## 2022-03-04 DIAGNOSIS — F10288 Alcohol dependence with other alcohol-induced disorder: Secondary | ICD-10-CM | POA: Diagnosis not present

## 2022-03-04 DIAGNOSIS — K5520 Angiodysplasia of colon without hemorrhage: Secondary | ICD-10-CM

## 2022-03-04 DIAGNOSIS — D500 Iron deficiency anemia secondary to blood loss (chronic): Secondary | ICD-10-CM | POA: Diagnosis not present

## 2022-03-04 LAB — CBC
HCT: 22.9 % — ABNORMAL LOW (ref 39.0–52.0)
Hemoglobin: 7.3 g/dL — ABNORMAL LOW (ref 13.0–17.0)
MCH: 28.6 pg (ref 26.0–34.0)
MCHC: 31.9 g/dL (ref 30.0–36.0)
MCV: 89.8 fL (ref 80.0–100.0)
Platelets: 190 10*3/uL (ref 150–400)
RBC: 2.55 MIL/uL — ABNORMAL LOW (ref 4.22–5.81)
RDW: 17.4 % — ABNORMAL HIGH (ref 11.5–15.5)
WBC: 7.7 10*3/uL (ref 4.0–10.5)
nRBC: 0 % (ref 0.0–0.2)

## 2022-03-04 LAB — HEPATIC FUNCTION PANEL
ALT: 21 U/L (ref 0–44)
AST: 68 U/L — ABNORMAL HIGH (ref 15–41)
Albumin: 2.7 g/dL — ABNORMAL LOW (ref 3.5–5.0)
Alkaline Phosphatase: 171 U/L — ABNORMAL HIGH (ref 38–126)
Bilirubin, Direct: 0.6 mg/dL — ABNORMAL HIGH (ref 0.0–0.2)
Indirect Bilirubin: 1 mg/dL — ABNORMAL HIGH (ref 0.3–0.9)
Total Bilirubin: 1.6 mg/dL — ABNORMAL HIGH (ref 0.3–1.2)
Total Protein: 7.5 g/dL (ref 6.5–8.1)

## 2022-03-04 LAB — BASIC METABOLIC PANEL
Anion gap: 7 (ref 5–15)
BUN: 13 mg/dL (ref 8–23)
CO2: 18 mmol/L — ABNORMAL LOW (ref 22–32)
Calcium: 8.5 mg/dL — ABNORMAL LOW (ref 8.9–10.3)
Chloride: 111 mmol/L (ref 98–111)
Creatinine, Ser: 1.5 mg/dL — ABNORMAL HIGH (ref 0.61–1.24)
GFR, Estimated: 52 mL/min — ABNORMAL LOW (ref >60)
Glucose, Bld: 93 mg/dL (ref 70–99)
Potassium: 4.1 mmol/L (ref 3.5–5.1)
Sodium: 136 mmol/L (ref 135–145)

## 2022-03-04 NOTE — Progress Notes (Signed)
[facility]  PROGRESS NOTE  Johnny Mcknight AVW:[id number] DOB: [birth year] DOA: 02/28/2022 PCP: Cassandria Anger, MD   Brief HPI:    66 y.o. male, with past medical history of GERD, alcohol abuse, chronic liver disease, alcoholic polyneuropathy, thiamine deficiency, vitamin D deficiency, zinc deficiency who was recently discharged from the hospital after he was admitted for symptomatic anemia.  Required blood transfusion.  Underwent EGD and colonoscopy.  Hemorrhoids were found on the exam, poor preparation for colonoscopy noted.  Also showed diverticulosis in the sigmoid colon in the descending and ascending colon.  EGD showed esophageal varices, gastritis which was biopsied. Patient was sent home on iron supplementation along with Protonix. Patient went to Dr. Lyndel [facility] for follow-up as outpatient and was found to have hemoglobin of 6.6.   S/p repeat colonoscopy 6/14.  Now getting capsule endoscopy. Hgb still trending down    Subjective  No dark BMs, no abdominal pain   Assessment/Plan:   Recurrent anemia -patient's hemoglobin was 8.1 on discharge on 02/20/2022, hemoglobin dropped to 6.6 in GI office.  -1 unit PRBC was given.   - EGD and colonoscopy done 2 weeks ago were mainly unremarkable except for hemorrhoids and diverticulosis seen on colonoscopy.   -GI has seen the patient- colonoscopy done now getting capsule endoscopy  -hgb still trending down  GI bleed- -s/p colonoscopy 6/14 -no sign of bleeding so now getting capsule endoscopy  Alcohol dependence-patient has history of alcohol dependence.  No signs and symptoms of alcohol withdrawal at this time.  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   - GI following  Hypertension-blood pressure mildly elevated, patient is not on medications at home.  Start p.o. hydralazine as needed  CKD stage IIIa- -around baseline    Medications     folic acid  1 mg Oral Daily   multivitamin with  minerals  1 tablet Oral Daily   pantoprazole (PROTONIX) IV  40 mg Intravenous Q12H   thiamine  100 mg Oral Daily   Or   thiamine  100 mg Intravenous Daily      Vitals:   03/03/22 1320 03/03/22 1330 03/03/22 2134 03/04/22 0551  BP: 117/82 135/76 130/77 128/82  Pulse: 84 80 86 78  Resp: (!) '21 17 18 18  '$ Temp:   99.2 F (37.3 C) 98.9 F (37.2 C)  TempSrc:   Oral Oral  SpO2: 100% 100% 100% 100%  Weight:      Height:          Data Reviewed:  Basic Metabolic Panel: Recent Labs  Lab 02/28/22 1136 02/28/22 1516 03/01/22 1115 03/03/22 0349 03/04/22 0312  NA 137 140 136 138 136  K 3.6 3.6 4.0 3.7 4.1  CL 108 112* 107 114* 111  CO2 20 20* 20* 17* 18*  GLUCOSE 114* 145* 146* 91 93  BUN '13 16 15 15 13  '$ CREATININE 1.49 1.58* 1.53* 1.60* 1.50*  CALCIUM 8.6 8.8* 8.8* 9.0 8.5*    CBC: Recent Labs  Lab 02/28/22 1136 02/28/22 1516 03/01/22 1115 03/03/22 0349 03/04/22 0312  WBC 7.4 7.2 8.3 7.4 7.7  NEUTROABS 5.5  --   --   --   --   HGB 6.6 Repeated and verified X2.* 6.4* 8.6* 7.5* 7.3*  HCT 20.1 Repeated and verified X2.* 20.6* 27.3* 23.4* 22.9*  MCV 88.5 92.4 90.7 89.7 89.8  PLT 203.0 212 232 209 190    LFT Recent Labs  Lab 02/28/22 1136 02/28/22 1516 03/01/22 1115 03/04/22 8295  AST 48* 54* 59* 68*  ALT '19 22 23 21  '$ ALKPHOS 190* 181* 191* 171*  BILITOT 1.3* 1.6* 2.9* 1.6*  PROT 7.6 7.8 8.7* 7.5  ALBUMIN 3.2* 3.0* 3.2* 2.7*     Antibiotics: Anti-infectives (From admission, onward)    None        DVT prophylaxis: SCDs  Code Status: Full code     CONSULTS  GI   Objective    Physical Examination:    General: Appearance:    Well developed, well nourished male in no acute distress     Lungs:     respirations unlabored  Heart:    Normal heart rate.   MS:   All extremities are intact.   Neurologic:   Awake, alert, oriented x 3. No apparent focal neurological           defect.      Status is: Inpatient: GI bleed         Johnny Mcknight   [facility] If 7PM-7AM, please contact night-coverage at [email or web address], Office  [phone]   03/04/2022, 10:56 AM  LOS: 4 days

## 2022-03-04 NOTE — Anesthesia Postprocedure Evaluation (Signed)
Anesthesia Post Note  Patient: Johnny Mcknight  Procedure(s) Performed: ESOPHAGOGASTRODUODENOSCOPY (EGD) WITH PROPOFOL GIVENS CAPSULE STUDY     Patient location during evaluation: Endoscopy Anesthesia Type: MAC Level of consciousness: awake and alert Pain management: pain level controlled Vital Signs Assessment: post-procedure vital signs reviewed and stable Respiratory status: spontaneous breathing, nonlabored ventilation, respiratory function stable and patient connected to nasal cannula oxygen Cardiovascular status: stable and blood pressure returned to baseline Postop Assessment: no apparent nausea or vomiting Anesthetic complications: no   No notable events documented.  Last Vitals:  Vitals:   03/03/22 2134 03/04/22 0551  BP: 130/77 128/82  Pulse: 86 78  Resp: 18 18  Temp: 37.3 C 37.2 C  SpO2: 100% 100%    Last Pain:  Vitals:   03/04/22 0730  TempSrc:   PainSc: 0-No pain                 Capricia Serda

## 2022-03-04 NOTE — Plan of Care (Signed)

## 2022-03-04 NOTE — Progress Notes (Signed)
[facility] Progress Note  CC:   Severe anemia, dizziness    Subjective: He feels well this morning.  He is tolerating a regular diet.  Last bowel movement was yesterday which was brown and watery.  No rectal bleeding or melena.  No nausea or vomiting.  No abdominal pain.  No chest pain or shortness of breath.  Objective:  Vital signs in last 24 hours: Temp:  [97.8 F (36.6 C)-99.2 F (37.3 C)] 98.9 F (37.2 C) (06/16 0551) Pulse Rate:  [77-86] 78 (06/16 0551) Resp:  [14-21] 18 (06/16 0551) BP: (104-135)/(45-82) 128/82 (06/16 0551) SpO2:  [99 %-100 %] 100 % (06/16 0551) Weight:  [79.2 kg] 79.2 kg (06/15 1131) Last BM Date : 03/02/22 General: 66 year old male in no acute distress. Heart: Regular rate and rhythm, murmurs. Pulm: Breath sounds clear Abdomen: Soft, nondistended.  Positive bowel sounds to all quadrants.  No palpable mass.  No hepatosplenomegaly. Extremities:  Without edema. Neurologic:  Alert and  oriented x 4. Grossly normal neurologically. Psych:  Alert and cooperative. Normal mood and affect.  Intake/Output from previous day: 06/15 0701 - 06/16 0700 In: 1050 [P.O.:750; I.V.:300] Out: 1365 [Urine:1365] Intake/Output this shift: Total I/O In: -  Out: 150 [Urine:150]  Lab Results: Recent Labs    03/01/22 1115 03/03/22 0349 03/04/22 0312  WBC 8.3 7.4 7.7  HGB 8.6* 7.5* 7.3*  HCT 27.3* 23.4* 22.9*  PLT 232 209 190   BMET Recent Labs    03/01/22 1115 03/03/22 0349 03/04/22 0312  NA 136 138 136  K 4.0 3.7 4.1  CL 107 114* 111  CO2 20* 17* 18*  GLUCOSE 146* 91 93  BUN _0 CREATININE 1.53* 1.60* 1.50*  CALCIUM 8.8* 9.0 8.5*   LFT Recent Labs    03/04/22 0312  PROT 7.5  ALBUMIN 2.7*  AST 68*  ALT 21  ALKPHOS 171*  BILITOT 1.6*  BILIDIR 0.6*  IBILI 1.0*   PT/INR No results for input(s): "LABPROT", "INR" in the last 72 hours. Hepatitis Panel No results for input(s): "HEPBSAG", "HCVAB", "HEPAIGM", "HEPBIGM" in  the last 72 hours.  No results found.  Assessment / Plan:  75) 66 year old male with anemia. Previously admitted to the hospital 5/31-6/12/2021 with profound anemia, Hg 4 with severe iron deficiency. He required 4 units of packed RBCs at that time and received IV iron.  EGD and colonoscopy 02/18/2022 identified grade 0-1 esophageal varices, no stigmata of bleeding and mild gastritis. Colonoscopy identified grade 4 prolapsed hemorrhoids, a 15 mm polyp was removed which was a tubovillous adenoma with stalk margin completely excised. Hg level 8.1 at time of discharged. Readmitted to the hospital 02/28/2022 with symptomatic anemia with Hg level of 6.6 -> 6.4 -> transfused 1 unit of PRBCs on 6/12 -> Hg 8.6 -> 7.5 -> today Hg 7.3. S/P colonoscopy 03/02/2022 identified a single solitary ulcer in the distal ascending colon likely due to prior polypectomy site, diverticulosis throughout the colon and nonbleeding internal and external hemorrhoids.  Ascending colon biopsies without evidence of colitis or malignancy. S/P EGD for VCE 6/16, unable to place capsule into small bowel therefore capsule was placed in the left antrum. No hematochezia or melena. Hemodynamically stable.  -Await small bowel capsule endoscopy result -CBC in am -Transfuse for hemoglobin less than 7  2) Alcohol use disorder.  No signs of alcohol withdrawal. -Complete alcohol abstinence  -Continue thiamine, folic acid and multivitamin daily  3) Cirrhosis (likely due to alcohol use  disorder), decompensated with portal hypertension and splenomegaly as well as small esophageal varices at EGD. MELD 14. T. Bili 1.6. Direct bili 0.6. Alk phos 171. AST 68. ALT 21. Albumin 2.7. Normal PLT count. AFP < 1.8. ANA +. Normal AMA and SMA levels. Liver CT 02/18/2022 showed marked heterogeneity of the liver worse on the left than right favoring geographic fatty infiltration, infiltrative HCC could not be excluded.  Evidence of portal hypertension with splenomegaly,  portosystemic collaterals -Outpatient follow up, to reconsider abdominal MRI if patient willing as he feels he cannot tolerate MRI even if sedated -No NSAIDs -2gm low sodium diet  4) GERD -Continue Pantoprazole 40 mg IV twice daily, changed to p.o. prior to discharge  5) CKD. Cr 1.60 -> 1.50.  Further recommendations per Dr. Tarri Glenn    Principal Problem:   GI bleed Active Problems:   Bacteremia due to Streptococcus pneumoniae   Acute respiratory failure with hypoxia (HCC)   Alcohol dependence with other alcohol-induced disorder (HCC)   Anemia of chronic disease   Iron deficiency anemia due to chronic blood loss   Colon ulcer     LOS: 4 days   Noralyn Pick  03/04/2022, 10:30AM

## 2022-03-04 NOTE — H&P (View-Only) (Signed)
   [facility] Progress Note  CC:   Severe anemia, dizziness    Subjective: He feels well this morning.  He is tolerating a regular diet.  Last bowel movement was yesterday which was brown and watery.  No rectal bleeding or melena.  No nausea or vomiting.  No abdominal pain.  No chest pain or shortness of breath.  Objective:  Vital signs in last 24 hours: Temp:  [97.8 F (36.6 C)-99.2 F (37.3 C)] 98.9 F (37.2 C) (06/16 0551) Pulse Rate:  [77-86] 78 (06/16 0551) Resp:  [14-21] 18 (06/16 0551) BP: (104-135)/(45-82) 128/82 (06/16 0551) SpO2:  [99 %-100 %] 100 % (06/16 0551) Weight:  [79.2 kg] 79.2 kg (06/15 1131) Last BM Date : 03/02/22 General: 66-year-old male in no acute distress. Heart: Regular rate and rhythm, murmurs. Pulm: Breath sounds clear Abdomen: Soft, nondistended.  Positive bowel sounds to all quadrants.  No palpable mass.  No hepatosplenomegaly. Extremities:  Without edema. Neurologic:  Alert and  oriented x 4. Grossly normal neurologically. Psych:  Alert and cooperative. Normal mood and affect.  Intake/Output from previous day: 06/15 0701 - 06/16 0700 In: 1050 [P.O.:750; I.V.:300] Out: 1365 [Urine:1365] Intake/Output this shift: Total I/O In: -  Out: 150 [Urine:150]  Lab Results: Recent Labs    03/01/22 1115 03/03/22 0349 03/04/22 0312  WBC 8.3 7.4 7.7  HGB 8.6* 7.5* 7.3*  HCT 27.3* 23.4* 22.9*  PLT 232 209 190   BMET Recent Labs    03/01/22 1115 03/03/22 0349 03/04/22 0312  NA 136 138 136  K 4.0 3.7 4.1  CL 107 114* 111  CO2 20* 17* 18*  GLUCOSE 146* 91 93  BUN 15 15 13  CREATININE 1.53* 1.60* 1.50*  CALCIUM 8.8* 9.0 8.5*   LFT Recent Labs    03/04/22 0312  PROT 7.5  ALBUMIN 2.7*  AST 68*  ALT 21  ALKPHOS 171*  BILITOT 1.6*  BILIDIR 0.6*  IBILI 1.0*   PT/INR No results for input(s): "LABPROT", "INR" in the last 72 hours. Hepatitis Panel No results for input(s): "HEPBSAG", "HCVAB", "HEPAIGM", "HEPBIGM" in  the last 72 hours.  No results found.  Assessment / Plan:  1) 66 year old male with anemia. Previously admitted to the hospital 5/31-6/12/2021 with profound anemia, Hg 4 with severe iron deficiency. He required 4 units of packed RBCs at that time and received IV iron.  EGD and colonoscopy 02/18/2022 identified grade 0-1 esophageal varices, no stigmata of bleeding and mild gastritis. Colonoscopy identified grade 4 prolapsed hemorrhoids, a 15 mm polyp was removed which was a tubovillous adenoma with stalk margin completely excised. Hg level 8.1 at time of discharged. Readmitted to the hospital 02/28/2022 with symptomatic anemia with Hg level of 6.6 -> 6.4 -> transfused 1 unit of PRBCs on 6/12 -> Hg 8.6 -> 7.5 -> today Hg 7.3. S/P colonoscopy 03/02/2022 identified a single solitary ulcer in the distal ascending colon likely due to prior polypectomy site, diverticulosis throughout the colon and nonbleeding internal and external hemorrhoids.  Ascending colon biopsies without evidence of colitis or malignancy. S/P EGD for VCE 6/16, unable to place capsule into small bowel therefore capsule was placed in the left antrum. No hematochezia or melena. Hemodynamically stable.  -Await small bowel capsule endoscopy result -CBC in am -Transfuse for hemoglobin less than 7  2) Alcohol use disorder.  No signs of alcohol withdrawal. -Complete alcohol abstinence  -Continue thiamine, folic acid and multivitamin daily  3) Cirrhosis (likely due to alcohol use   disorder), decompensated with portal hypertension and splenomegaly as well as small esophageal varices at EGD. MELD 14. T. Bili 1.6. Direct bili 0.6. Alk phos 171. AST 68. ALT 21. Albumin 2.7. Normal PLT count. AFP < 1.8. ANA +. Normal AMA and SMA levels. Liver CT 02/18/2022 showed marked heterogeneity of the liver worse on the left than right favoring geographic fatty infiltration, infiltrative HCC could not be excluded.  Evidence of portal hypertension with splenomegaly,  portosystemic collaterals -Outpatient follow up, to reconsider abdominal MRI if patient willing as he feels he cannot tolerate MRI even if sedated -No NSAIDs -2gm low sodium diet  4) GERD -Continue Pantoprazole 40 mg IV twice daily, changed to p.o. prior to discharge  5) CKD. Cr 1.60 -> 1.50.  Further recommendations per Dr. Beavers    Principal Problem:   GI bleed Active Problems:   Bacteremia due to Streptococcus pneumoniae   Acute respiratory failure with hypoxia (HCC)   Alcohol dependence with other alcohol-induced disorder (HCC)   Anemia of chronic disease   Iron deficiency anemia due to chronic blood loss   Colon ulcer     LOS: 4 days   Jalea Bronaugh M Kennedy-Smith  03/04/2022, 10:30AM    

## 2022-03-04 NOTE — Care Management Important Message (Signed)
Important Message  Patient Details IM Letter given to the Patient. Name: Jung Yurchak MRN: [id number] Date of Birth: Sep 29, 1955   Medicare Important Message Given:  Yes     Kerin Salen 03/04/2022, 1:26 PM

## 2022-03-04 NOTE — Plan of Care (Signed)
Plan of care reviewed and discussed with the patient. 

## 2022-03-05 ENCOUNTER — Inpatient Hospital Stay (HOSPITAL_COMMUNITY): Payer: Medicare HMO | Admitting: Anesthesiology

## 2022-03-05 ENCOUNTER — Encounter (HOSPITAL_COMMUNITY)
Admission: EM | Disposition: A | Discharge status: Home Health | Payer: Self-pay | Source: Home / Self Care | Attending: Internal Medicine

## 2022-03-05 ENCOUNTER — Encounter (HOSPITAL_COMMUNITY): Payer: Self-pay | Admitting: Family Medicine

## 2022-03-05 DIAGNOSIS — K317 Polyp of stomach and duodenum: Secondary | ICD-10-CM

## 2022-03-05 DIAGNOSIS — K5520 Angiodysplasia of colon without hemorrhage: Secondary | ICD-10-CM

## 2022-03-05 DIAGNOSIS — K31819 Angiodysplasia of stomach and duodenum without bleeding: Secondary | ICD-10-CM

## 2022-03-05 DIAGNOSIS — I8510 Secondary esophageal varices without bleeding: Secondary | ICD-10-CM

## 2022-03-05 DIAGNOSIS — K31811 Angiodysplasia of stomach and duodenum with bleeding: Secondary | ICD-10-CM

## 2022-03-05 DIAGNOSIS — K766 Portal hypertension: Secondary | ICD-10-CM

## 2022-03-05 DIAGNOSIS — K922 Gastrointestinal hemorrhage, unspecified: Secondary | ICD-10-CM | POA: Diagnosis not present

## 2022-03-05 DIAGNOSIS — F10288 Alcohol dependence with other alcohol-induced disorder: Secondary | ICD-10-CM | POA: Diagnosis not present

## 2022-03-05 DIAGNOSIS — D132 Benign neoplasm of duodenum: Secondary | ICD-10-CM

## 2022-03-05 DIAGNOSIS — K3189 Other diseases of stomach and duodenum: Secondary | ICD-10-CM

## 2022-03-05 HISTORY — PX: ENTEROSCOPY: SHX5533

## 2022-03-05 HISTORY — PX: BIOPSY: SHX5522

## 2022-03-05 HISTORY — PX: HOT HEMOSTASIS: SHX5433

## 2022-03-05 LAB — CBC
HCT: 24.2 % — ABNORMAL LOW (ref 39.0–52.0)
Hemoglobin: 7.6 g/dL — ABNORMAL LOW (ref 13.0–17.0)
MCH: 28.5 pg (ref 26.0–34.0)
MCHC: 31.4 g/dL (ref 30.0–36.0)
MCV: 90.6 fL (ref 80.0–100.0)
Platelets: 200 10*3/uL (ref 150–400)
RBC: 2.67 MIL/uL — ABNORMAL LOW (ref 4.22–5.81)
RDW: 17.2 % — ABNORMAL HIGH (ref 11.5–15.5)
WBC: 6.3 10*3/uL (ref 4.0–10.5)
nRBC: 0 % (ref 0.0–0.2)

## 2022-03-05 LAB — BASIC METABOLIC PANEL
Anion gap: 5 (ref 5–15)
BUN: 12 mg/dL (ref 8–23)
CO2: 20 mmol/L — ABNORMAL LOW (ref 22–32)
Calcium: 8.9 mg/dL (ref 8.9–10.3)
Chloride: 113 mmol/L — ABNORMAL HIGH (ref 98–111)
Creatinine, Ser: 1.44 mg/dL — ABNORMAL HIGH (ref 0.61–1.24)
GFR, Estimated: 55 mL/min — ABNORMAL LOW (ref >60)
Glucose, Bld: 100 mg/dL — ABNORMAL HIGH (ref 70–99)
Potassium: 4.1 mmol/L (ref 3.5–5.1)
Sodium: 138 mmol/L (ref 135–145)

## 2022-03-05 LAB — PREPARE RBC (CROSSMATCH)

## 2022-03-05 SURGERY — ENTEROSCOPY
Anesthesia: Monitor Anesthesia Care

## 2022-03-05 MED ORDER — LIDOCAINE 2% (20 MG/ML) 5 ML SYRINGE
INTRAMUSCULAR | Status: DC | PRN
  Administered 2022-03-05: 40 mg via INTRAVENOUS

## 2022-03-05 MED ORDER — PROPOFOL 500 MG/50ML IV EMUL
INTRAVENOUS | Status: DC | PRN
  Administered 2022-03-05: 150 ug/kg/min via INTRAVENOUS

## 2022-03-05 MED ORDER — PROPOFOL 10 MG/ML IV BOLUS
INTRAVENOUS | Status: DC | PRN
  Administered 2022-03-05 (×2): 20 mg via INTRAVENOUS
  Administered 2022-03-05: 50 mg via INTRAVENOUS

## 2022-03-05 MED ORDER — SODIUM CHLORIDE 0.9% IV SOLUTION: Freq: Once | INTRAVENOUS | Status: AC

## 2022-03-05 MED ORDER — SODIUM CHLORIDE 0.9 % IV SOLN: INTRAVENOUS | Status: DC

## 2022-03-05 NOTE — Op Note (Addendum)
[facility] Patient Name: Johnny Mcknight Procedure Date: 03/05/2022 MRN: [id number] Attending MD: Thornton Park MD, MD Date of Birth: May 15, 1956 CSN: [id number] Age: 66 Admit Type: Inpatient Procedure:                Small bowel enteroscopy Indications:              Arteriovenous malformation in the small intestine                            seen on capsule endoscopy, recurrent iron                            deficiency anemia Providers:                Thornton Park MD, MD, Jaci Carrel, RN,                            William Dalton, Technician Referring MD:              Medicines:                Monitored Anesthesia Care Complications:            No immediate complications. Estimated Blood Loss:     Estimated blood loss was minimal. Procedure:                Pre-Anesthesia Assessment:                           - Prior to the procedure, a History and Physical                            was performed, and patient medications and                            allergies were reviewed. The patient's tolerance of                            previous anesthesia was also reviewed. The risks                            and benefits of the procedure and the sedation                            options and risks were discussed with the patient.                            All questions were answered, and informed consent                            was obtained. Prior Anticoagulants: The patient has                            taken no previous anticoagulant or antiplatelet                            agents. ASA Grade  Assessment: III - A patient with                            severe systemic disease. After reviewing the risks                            and benefits, the patient was deemed in                            satisfactory condition to undergo the procedure.                           After obtaining informed consent, the endoscope was                            passed  under direct vision. Throughout the                            procedure, the patient's blood pressure, pulse, and                            oxygen saturations were monitored continuously. The                            PCF-HQ190L ([id number]) Olympus colonoscope was                            introduced through the mouth and advanced to the                            jejunum, to the 120 cm mark (from the incisors).                            The small bowel enteroscopy was technically                            difficult and complex due to significant looping.                            The patient tolerated the procedure well. Scope In: Scope Out: Findings:      Prominent veins consistent with grade 0-I varices were found in the       lower third of the esophagus.      Mild portal hypertensive gastropathy was found in the gastric body. No       gastric varices. No GAVE.      Diffuse mildly erythematous mucosa was found in the cardia, in the       gastric fundus, and in the antrum. A single erosion was present in the       antrum.      Evaluation of the small bowel was limited by scope looping in the       stomach. Despite repositioning the patient and applying abdominal       pressure, I was unable to prevent the loop from forming. I was unable to  advance beyond 120 cm from the incisors due to the looping. A single       angioectasia verus focal scope trauma with no bleeding was found in the       duodenal bulb. A second non-bleeding AVM was seen 80 cm from the       incisors. Coagulation for bleeding prevention using argon plasma at 2       liters/minute and 20 watts was successful in both areas. Estimated blood       loss: none.      A single 6 mm sessile polyp was found in the fourth portion of the       duodenum, approximately 75 cm from the incisors. A single biopsy was       taken. Due to the significant oozing that occurred and his recurrent       bleeding, I did not take  additional biopsies at this time.      Localized mucosal changes characterized by punctated white spots were       found in the fourth portion of the duodenum consistent with       lymphangiectasia. Impression:               - Grade 0-I esophageal varices.                           - Portal hypertensive gastropathy.                           - Erythematous mucosa in the stomach.                           - A single non-bleeding angioectasia in the                            duodenal bulb and a single non-bleeding                            angioectasia in the distal duodenum. Treated with                            argon plasma coagulation (APC).                           - A single duodenal polyp. Biopsied but not                            completely removed. Ozzing occured with biopsy.                           - No active bleeding seen. Recommendation:           - Return patient to hospital ward for ongoing care.                           - Advance diet as tolerated.                           - Await pathology results.                           -  Referral to [facility] for outpatient double balloon                            enteroscopy as I was unable to reach some of the                            AVMs seen on recent capsule endoscopy.                           - Close outpatient follow-up with serial hgb/hct.                            Consideration for octreotide if bleeding recurs.                           - I will arrange outpatient follow-up with Dr.                            Lyndel Safe. Procedure Code(s):        --- Professional ---                           [phone], 66, Small intestinal endoscopy, enteroscopy                            beyond second portion of duodenum, not including                            ileum; with control of bleeding (eg, injection,                            bipolar cautery, unipolar cautery, laser, heater                            probe, stapler, plasma  coagulator)                           [id number], 74, Small intestinal endoscopy, enteroscopy                            beyond second portion of duodenum, not including                            ileum; with biopsy, single or multiple Diagnosis Code(s):        --- Professional ---                           I85.00, Esophageal varices without bleeding                           K76.6, Portal hypertension                           K31.89, Other diseases of stomach and duodenum  K31.819, Angiodysplasia of stomach and duodenum                            without bleeding                           K31.7, Polyp of stomach and duodenum CPT copyright 2019 [employer] Association. All rights reserved. The codes documented in this report are preliminary and upon coder review may  be revised to meet current compliance requirements. Thornton Park MD, MD 03/05/2022 1:39:26 PM This report has been signed electronically. Number of Addenda: 0

## 2022-03-05 NOTE — Interval H&P Note (Signed)
History and Physical Interval Note:  03/05/2022 12:39 PM  Johnny Mcknight  has presented today for surgery, with the diagnosis of Small bowel AVMs.  The various methods of treatment have been discussed with the patient and family. After consideration of risks, benefits and other options for treatment, the patient has consented to  Procedure(s): ENTEROSCOPY (N/A) as a surgical intervention.  The patient's history has been reviewed, patient examined, no change in status, stable for surgery.  I have reviewed the patient's chart and labs.  Questions were answered to the patient's satisfaction.     Thornton Park

## 2022-03-05 NOTE — Transfer of Care (Signed)
Immediate Anesthesia Transfer of Care Note  Patient: Johnny Mcknight  Procedure(s) Performed: ENTEROSCOPY BIOPSY HOT HEMOSTASIS (ARGON PLASMA COAGULATION/BICAP)  Patient Location: PACU  Anesthesia Type:MAC  Level of Consciousness: awake and alert   Airway & Oxygen Therapy: Patient Spontanous Breathing and Patient connected to nasal cannula oxygen  Post-op Assessment: Report given to RN and Post -op Vital signs reviewed and stable  Post vital signs: Reviewed and stable  Last Vitals:  Vitals Value Taken Time  BP 144/80 03/05/22 1404  Temp 36.5 C 03/05/22 1404  Pulse 72 03/05/22 1404  Resp 26 03/05/22 1355  SpO2 100 % 03/05/22 1404  Vitals shown include unvalidated device data.  Last Pain:  Vitals:   03/05/22 1715  TempSrc: (P) Oral  PainSc:          Complications: No notable events documented.

## 2022-03-05 NOTE — Progress Notes (Addendum)
[facility]  PROGRESS NOTE  Johnny Mcknight HGD:[id number] DOB: 03-15-1956 DOA: 02/28/2022 PCP: Cassandria Anger, MD   Brief HPI:    66 y.o. male, with past medical history of GERD, alcohol abuse, chronic liver disease, alcoholic polyneuropathy, thiamine deficiency, vitamin D deficiency, zinc deficiency who was recently discharged from the hospital after he was admitted for symptomatic anemia.  Required blood transfusion.  Underwent EGD and colonoscopy.  Hemorrhoids were found on the exam, poor preparation for colonoscopy noted.  Also showed diverticulosis in the sigmoid colon in the descending and ascending colon.  EGD showed esophageal varices, gastritis which was biopsied. Patient was sent home on iron supplementation along with Protonix. Patient went to Dr. Lyndel Safe clinic for follow-up as outpatient and was found to have hemoglobin of 6.6.   S/p repeat colonoscopy 6/14.  Now getting capsule endoscopy. Hgb still trending down    Subjective  No dark BMs, no abdominal pain   Assessment/Plan:   Recurrent anemia -patient's hemoglobin was 8.1 on discharge on 02/20/2022, hemoglobin dropped to 6.6 in GI office.  -1 unit PRBC was given.   - EGD and colonoscopy done 2 weeks ago were mainly unremarkable except for hemorrhoids and diverticulosis seen on colonoscopy.   -GI has seen the patient- colonoscopy done  -capsule endoscopy done: 4 nonbleeding AVMS - two very proximally and two very distally in the small bowel. -plan for push enteroscopy with APC  -monitor hgb  GI bleed- -s/p colonoscopy 6/14 -s/p capsule endscopy -see above  Alcohol dependence-patient has history of alcohol dependence.  No signs and symptoms of alcohol withdrawal at this time.  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   - GI following  Hypertension-blood pressure mildly elevated, patient is not on medications at home.  Start p.o. hydralazine as needed  CKD stage IIIa- -around  baseline     Addendum: GI to send him to [facility] for outpatient double balloon enteroscopy Will transfuse 1 unit PRBC Will also need weekly cbcs   Medications     folic acid  1 mg Oral Daily   multivitamin with minerals  1 tablet Oral Daily   pantoprazole (PROTONIX) IV  40 mg Intravenous Q12H   thiamine  100 mg Oral Daily   Or   thiamine  100 mg Intravenous Daily      Vitals:   03/04/22 0551 03/04/22 1356 03/04/22 2214 03/05/22 0558  BP: 128/82 124/80 (!) 144/85 133/85  Pulse: 78 79 81 74  Resp: '18 17 16 20  '$ Temp: 98.9 F (37.2 C) 98.1 F (36.7 C) 98.3 F (36.8 C) 98.2 F (36.8 C)  TempSrc: Oral Oral Oral   SpO2: 100% 100% 99% 100%  Weight:      Height:          Data Reviewed:  Basic Metabolic Panel: Recent Labs  Lab 02/28/22 1516 03/01/22 1115 03/03/22 0349 03/04/22 0312 03/05/22 0312  NA 140 136 138 136 138  K 3.6 4.0 3.7 4.1 4.1  CL 112* 107 114* 111 113*  CO2 20* 20* 17* 18* 20*  GLUCOSE 145* 146* 91 93 100*  BUN '16 15 15 13 12  '$ CREATININE 1.58* 1.53* 1.60* 1.50* 1.44*  CALCIUM 8.8* 8.8* 9.0 8.5* 8.9    CBC: Recent Labs  Lab 02/28/22 1136 02/28/22 1516 03/01/22 1115 03/03/22 0349 03/04/22 0312 03/05/22 0312  WBC 7.4 7.2 8.3 7.4 7.7 6.3  NEUTROABS 5.5  --   --   --   --   --  HGB 6.6 Repeated and verified X2.* 6.4* 8.6* 7.5* 7.3* 7.6*  HCT 20.1 Repeated and verified X2.* 20.6* 27.3* 23.4* 22.9* 24.2*  MCV 88.5 92.4 90.7 89.7 89.8 90.6  PLT 203.0 212 232 209 190 200    LFT Recent Labs  Lab 02/28/22 1136 02/28/22 1516 03/01/22 1115 03/04/22 0312  AST 48* 54* 59* 68*  ALT '19 22 23 21  '$ ALKPHOS 190* 181* 191* 171*  BILITOT 1.3* 1.6* 2.9* 1.6*  PROT 7.6 7.8 8.7* 7.5  ALBUMIN 3.2* 3.0* 3.2* 2.7*     Antibiotics: Anti-infectives (From admission, onward)    None        DVT prophylaxis: SCDs  Code Status: Full code     CONSULTS  GI   Objective    Physical Examination:    General: Appearance:    Well  developed, well nourished male in no acute distress   Dry skin on LE  Lungs:      respirations unlabored  Heart:    Normal heart rate. Normal rhythm. No murmurs, rubs, or gallops.   MS:   All extremities are intact.   Neurologic:   Awake, alert, oriented x 3. No apparent focal neurological           defect.      Status is: Inpatient: GI bleed        [name]   [facility] If 7PM-7AM, please contact night-coverage at [email or web address], Office  [phone]   03/05/2022, 8:41 AM  LOS: 5 days

## 2022-03-05 NOTE — Plan of Care (Signed)
  Problem: Education: Goal: Knowledge of General Education information will improve Description: Including pain rating scale, medication(s)/side effects and non-pharmacologic comfort measures Outcome: Progressing   Problem: Health Behavior/Discharge Planning: Goal: Ability to manage health-related needs will improve Outcome: Progressing   Problem: Activity: Goal: Risk for activity intolerance will decrease Outcome: Not Applicable   Problem: Nutrition: Goal: Adequate nutrition will be maintained Outcome: Progressing   Problem: Pain Managment: Goal: General experience of comfort will improve Outcome: Progressing

## 2022-03-05 NOTE — Progress Notes (Signed)
    GI Progress Note  Capsule endoscopy shows 4 nonbleeding AVMS - two very proximally and two very distally in the small bowel.  Hemoglobin has been stable. No overt bleeding. No BM over night.   Results reviewed with the patient. Push enteroscopy with APC recommended. NPO with plans for procedure later today.  The nature of the procedure, as well as the risks, benefits, and alternatives were carefully and thoroughly reviewed with the patient. Ample time for discussion and questions allowed. The patient understood, was satisfied, and agreed to proceed.

## 2022-03-05 NOTE — Anesthesia Postprocedure Evaluation (Signed)
Anesthesia Post Note  Patient: Anothony Bursch  Procedure(s) Performed: ENTEROSCOPY BIOPSY HOT HEMOSTASIS (ARGON PLASMA COAGULATION/BICAP)     Patient location during evaluation: PACU Anesthesia Type: MAC Level of consciousness: awake and alert Pain management: pain level controlled Vital Signs Assessment: post-procedure vital signs reviewed and stable Respiratory status: spontaneous breathing, nonlabored ventilation, respiratory function stable and patient connected to nasal cannula oxygen Cardiovascular status: stable and blood pressure returned to baseline Postop Assessment: no apparent nausea or vomiting Anesthetic complications: no   No notable events documented.  Last Vitals:  Vitals:   03/05/22 1404 03/05/22 1715  BP: (!) 144/80 (P) 132/76  Pulse: 72 (P) 80  Resp:  (P) 16  Temp: 36.5 C (P) 37.1 C  SpO2: 100% (P) 100%    Last Pain:  Vitals:   03/05/22 1715  TempSrc: (P) Oral  PainSc:                  Jhalen Eley L Gladyce Mcray

## 2022-03-05 NOTE — Plan of Care (Signed)
  Problem: Education: Goal: Knowledge of General Education information will improve Description: Including pain rating scale, medication(s)/side effects and non-pharmacologic comfort measures Outcome: Progressing   Problem: Pain Managment: Goal: General experience of comfort will improve Outcome: Progressing   Problem: Clinical Measurements: Goal: Diagnostic test results will improve Outcome: Progressing

## 2022-03-05 NOTE — Anesthesia Preprocedure Evaluation (Addendum)
Anesthesia Evaluation  Patient identified by MRN, date of birth, ID band Patient awake    Reviewed: Allergy & Precautions, NPO status , Patient's Chart, lab work & pertinent test results  Airway Mallampati: I  TM Distance: >3 FB Neck ROM: Full    Dental  (+) Edentulous Upper, Missing, Dental Advisory Given,    Pulmonary COPD, Current Smoker and Patient abstained from smoking.,    Pulmonary exam normal breath sounds clear to auscultation       Cardiovascular hypertension, Normal cardiovascular exam Rhythm:Regular Rate:Normal  TTE 2023 1. Left ventricular ejection fraction, by estimation, is 70 to 75%. The  left ventricle has hyperdynamic function. The left ventricle has no  regional wall motion abnormalities. There is mild left ventricular  hypertrophy. Left ventricular diastolic  parameters are consistent with Grade I diastolic dysfunction (impaired  relaxation).  2. Right ventricular systolic function is normal. The right ventricular  size is normal. There is normal pulmonary artery systolic pressure.  3. Left atrial size was moderately dilated.  4. The mitral valve is normal in structure. Trivial mitral valve  regurgitation. No evidence of mitral stenosis.  5. The aortic valve is tricuspid. Aortic valve regurgitation is not  visualized. No aortic stenosis is present.  6. The inferior vena cava is normal in size with greater than 50%  respiratory variability, suggesting right atrial pressure of 3 mmHg.   Neuro/Psych negative neurological ROS  negative psych ROS   GI/Hepatic PUD, GERD  ,(+) Cirrhosis   Esophageal Varices  substance abuse  alcohol use, Hepatitis -  Endo/Other  negative endocrine ROS  Renal/GU Renal InsufficiencyRenal diseaseLab Results      Component                Value               Date                      CREATININE               1.44 (H)            03/05/2022                BUN                       12                  03/05/2022                NA                       138                 03/05/2022                K                        4.1                 03/05/2022                CL                       113 (H)             03/05/2022  CO2                      20 (L)              03/05/2022             negative genitourinary   Musculoskeletal negative musculoskeletal ROS (+)   Abdominal   Peds  Hematology  (+) Blood dyscrasia, anemia , Lab Results      Component                Value               Date                      WBC                      6.3                 03/05/2022                HGB                      7.6 (L)             03/05/2022                HCT                      24.2 (L)            03/05/2022                MCV                      90.6                03/05/2022                PLT                      200                 03/05/2022              Anesthesia Other Findings 66 y.o. male, with past medical history of GERD, alcohol abuse, chronic liver disease, alcoholic polyneuropathy, thiamine deficiency, vitamin D deficiency, zinc deficiency who was recently discharged from the hospital after he was admitted for symptomatic anemia.  Required blood transfusion. EGD and colonoscopy significant for hemorrhoids, diverticulosis, esophageal varices, and gastritis. Now presents with anemia. Hgb on admission 6.6. Now 7.6 s/p 1U RBCs  Reproductive/Obstetrics                           Anesthesia Physical Anesthesia Plan  ASA: 3  Anesthesia Plan: MAC   Post-op Pain Management:    Induction: Intravenous  PONV Risk Score and Plan: Propofol infusion and Treatment may vary due to age or medical condition  Airway Management Planned: Natural Airway  Additional Equipment:   Intra-op Plan:   Post-operative Plan:   Informed Consent: I have reviewed the patients History and Physical, chart, labs and discussed the  procedure including the risks, benefits and alternatives for the proposed anesthesia with the patient or authorized representative who has indicated his/her understanding and acceptance.     Dental advisory given  Plan Discussed with: CRNA  Anesthesia Plan Comments:         Anesthesia Quick Evaluation

## 2022-03-06 ENCOUNTER — Encounter (HOSPITAL_COMMUNITY): Payer: Self-pay | Admitting: Gastroenterology

## 2022-03-06 DIAGNOSIS — F10288 Alcohol dependence with other alcohol-induced disorder: Secondary | ICD-10-CM | POA: Diagnosis not present

## 2022-03-06 DIAGNOSIS — K31811 Angiodysplasia of stomach and duodenum with bleeding: Secondary | ICD-10-CM | POA: Diagnosis not present

## 2022-03-06 LAB — CBC
HCT: 28 % — ABNORMAL LOW (ref 39.0–52.0)
Hemoglobin: 9.1 g/dL — ABNORMAL LOW (ref 13.0–17.0)
MCH: 28.4 pg (ref 26.0–34.0)
MCHC: 32.5 g/dL (ref 30.0–36.0)
MCV: 87.5 fL (ref 80.0–100.0)
Platelets: 213 10*3/uL (ref 150–400)
RBC: 3.2 MIL/uL — ABNORMAL LOW (ref 4.22–5.81)
RDW: 17 % — ABNORMAL HIGH (ref 11.5–15.5)
WBC: 7.4 10*3/uL (ref 4.0–10.5)
nRBC: 0 % (ref 0.0–0.2)

## 2022-03-06 NOTE — TOC Transition Note (Addendum)
Transition of Care [facility]) - CM/SW Discharge Note   Patient Details  Name: Kiara Keep MRN: [id number] Date of Birth: 1955-12-11  [facility] CM/SW Contact:  Ross Ludwig, LCSW Phone Number: 03/06/2022, 9:56 AM   Clinical Narrative:     Patient is currently open to [facility] for home health PT and OT.  CSW updated Cindie that patient will be discharging today.  Patient offered substance abuse resources, patient stated he is not concerned about it, and has them from last hospitalization.  CSW signing off, please reconsult if other social work needs arise.  Final next level of care: [place] Barriers to Discharge: Barriers Resolved   Patient Goals and CMS Choice Patient states their goals for this hospitalization and ongoing recovery are:: To return back home. CMS Medicare.gov Compare Post Acute Care list provided to:: Patient Choice offered to / list presented to : Patient  Discharge Placement                       Discharge Plan and Services In-house Referral: Clinical Social Work                        HH Arranged: PT, OT HH Agency: [place] Date [place]: 03/06/22 Time [place]: [phone] Representative spoke with at [place]: [place] (Mark) Interventions     Readmission Risk Interventions    03/03/2022    2:15 PM 02/18/2022   10:40 AM  Readmission Risk Prevention Plan  Post Dischage Appt  Complete  Medication Screening  Complete  Transportation Screening Complete Complete  PCP or Specialist Appt within 5-7 Days Complete   Home Care Screening Complete   Medication Review (RN CM) Complete

## 2022-03-06 NOTE — Progress Notes (Signed)
     [facility] Progress Note  CC:  Bleeding  Assessment / Plan: Recurrent profound iron deficiency anemia thought to be due to GI blood loss from small bowel AVMs. I was able to treat 2 of the AVMs seen on capsule endoscopy yesterday. Unfortunately, the other AVMs were more distal to the scope. Hemoglobin is stable today after a blood transfusion last night. I have recommended that Mr. Montelongo go to [facility] for an outpatient double balloon enteroscopy to treat the remaining AVMs. He should have regular checks of his hemoglobin prior to that time. Could consider long acting octreotide if bleeding recurs.   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  Abstinence from alcohol encouraged. We will arrange outpatient follow-up for monitoring.   Abnormal liver on CT scan 02/18/22. Recommend outpatient MRI for further evaluation and to exclude diffuse HCC. He says he is severely claustrophobic and has woken up twice with prior MRIs and has been unable to complete. AFP is normal.   GERD. Symptoms controlled on pantoprazole. Discharge home on oral pantoprazole QAM.   I anticipate that he will go home soon. I will arrange outpatient follow-up at [facility] with Dr. Lyndel Safe. Please call the on-call gastroenterologist with any additional questions or concerns prior to that time.   Subjective: Feeling well today. No overt bleeding. GI ROS is negative.  No family present at the time of my evaluation.   Objective:  Vital signs in last 24 hours: Temp:  [97.6 F (36.4 C)-99.2 F (37.3 C)] 98.6 F (37 C) (06/18 0525) Pulse Rate:  [72-80] 74 (06/18 0525) Resp:  [14-19] 16 (06/18 0525) BP: (104-150)/(62-82) 137/82 (06/18 0525) SpO2:  [98 %-100 %] 98 % (06/18 0525) Weight:  [79.2 kg] 79.2 kg (06/17 1248) Last BM Date :  03/02/22 General:   Alert, in NAD, in bed eating breakfast Abdomen:  Soft. Nontender. Nondistended. Normal bowel sounds. No rebound or guarding. LAD: No inguinal or umbilical LAD Extremities:  Without edema. Neurologic:  Alert and  oriented x4;  grossly normal neurologically. Psych:  Alert and cooperative. Normal mood and affect.  Lab Results: Recent Labs    03/04/22 0312 03/05/22 0312 03/06/22 0258  WBC 7.7 6.3 7.4  HGB 7.3* 7.6* 9.1*  HCT 22.9* 24.2* 28.0*  PLT 190 200 213   BMET Recent Labs    03/04/22 0312 03/05/22 0312  NA 136 138  K 4.1 4.1  CL 111 113*  CO2 18* 20*  GLUCOSE 93 100*  BUN 13 12  CREATININE 1.50* 1.44*  CALCIUM 8.5* 8.9   LFT Recent Labs    03/04/22 0312  PROT 7.5  ALBUMIN 2.7*  AST 68*  ALT 21  ALKPHOS 171*  BILITOT 1.6*  BILIDIR 0.6*  IBILI 1.0*     LOS: 6 days   Thornton Park  03/06/2022, 8:54 AM

## 2022-03-06 NOTE — Plan of Care (Signed)
  Problem: Education: Goal: Knowledge of General Education information will improve Description: Including pain rating scale, medication(s)/side effects and non-pharmacologic comfort measures Outcome: Progressing   Problem: Safety: Goal: Ability to remain free from injury will improve Outcome: Progressing   Problem: Nutrition: Goal: Adequate nutrition will be maintained Outcome: Progressing

## 2022-03-06 NOTE — Progress Notes (Signed)
Discharge complete, patient waiting on ride.

## 2022-03-06 NOTE — Progress Notes (Signed)
Patient verbalized understanding of following up with referral to [facility]

## 2022-03-06 NOTE — Discharge Summary (Signed)
Physician Discharge Summary  Johnny Mcknight OZH:[id number] DOB: Feb 09, 1956 DOA: 02/28/2022  PCP: Cassandria Anger, MD  Admit date: 02/28/2022 Discharge date: 03/06/2022  Admitted From: home Discharge disposition: home   Recommendations for Outpatient Follow-Up:   Referral to [facility] for double balloon endoscopy Cbc weekly: 9.1 at discharge   Discharge Diagnosis:   Principal Problem:   GI bleed Active Problems:   Bacteremia due to Streptococcus pneumoniae   Acute respiratory failure with hypoxia (HCC)   Alcohol dependence with other alcohol-induced disorder (HCC)   Anemia of chronic disease   Iron deficiency anemia due to chronic blood loss   Colon ulcer   Angiodysplasia of small intestine   Adenomatous duodenal polyp   Gastric and duodenal angiodysplasia with hemorrhage    Discharge Condition: Improved.  Diet recommendation: Low sodium, heart healthy.  Carbohydrate-modified  Wound care: None.  Code status: Full.   History of Present Illness:   Johnny Mcknight  is a 66 y.o. male, with past medical history of GERD, alcohol abuse, chronic liver disease, alcoholic polyneuropathy, thiamine deficiency, vitamin D deficiency, zinc deficiency who was recently discharged from the hospital after he was admitted for symptomatic anemia.  Required blood transfusion.  Underwent EGD and colonoscopy.  Hemorrhoids were found on the exam, poor preparation for colonoscopy noted.  Also showed diverticulosis in the sigmoid colon in the descending and ascending colon.  EGD showed esophageal varices, gastritis which was biopsied. Patient was sent home on iron supplementation along with Protonix. Today patient went to Dr. Lyndel Safe clinic for follow-up as outpatient and was found to have hemoglobin of 6.6.  Patient was sent to ED for blood transfusion.   Hospital Course by Problem:   Recurrent anemia -patient's hemoglobin was 8.1 on discharge on 02/20/2022, hemoglobin dropped to 6.6 in GI office.   -2 unit PRBC was given.   - EGD and colonoscopy done 2 weeks ago were mainly unremarkable except for hemorrhoids and diverticulosis seen on colonoscopy.   -GI has seen the patient- colonoscopy done  -capsule endoscopy done: 4 nonbleeding AVMS - two very proximally and two very distally in the small bowel. -unable to do push enteroscopy with APC  -to [facility] for an outpatient double balloon enteroscopy to treat the remaining AVMs   GI bleed- -s/p colonoscopy 6/14 -s/p capsule endscopy -see above   Alcohol dependence-patient has history of alcohol dependence.  No signs and symptoms of alcohol withdrawal at this time.  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   - GI following   Hypertension-blood pressure mildly elevated, patient is not on medications at home -monitor outpatient   CKD stage IIIa- -around baseline        Medical Consultants:    GI  Discharge Exam:   Vitals:   03/05/22 2055 03/06/22 0525  BP: (!) 142/80 137/82  Pulse: 80 74  Resp: 15 16  Temp: 98.6 F (37 C) 98.6 F (37 C)  SpO2: 100% 98%   Vitals:   03/05/22 1715 03/05/22 1730 03/05/22 2055 03/06/22 0525  BP: 132/76 131/75 (!) 142/80 137/82  Pulse: 80 79 80 74  Resp: '16 16 15 16  '$ Temp: 98.8 F (37.1 C) 99.2 F (37.3 C) 98.6 F (37 C) 98.6 F (37 C)  TempSrc: Oral Oral Oral Oral  SpO2: 100% 100% 100% 98%  Weight:      Height:        General exam: Appears calm and comfortable.   The results of significant  diagnostics from this hospitalization (including imaging, microbiology, ancillary and laboratory) are listed below for reference.     Procedures and Diagnostic Studies:   No results found.   Labs:   Basic Metabolic Panel: Recent Labs  Lab 02/28/22 1516 03/01/22 1115 03/03/22 0349 03/04/22 0312 03/05/22 0312  NA 140 136 138 136 138  K 3.6 4.0 3.7 4.1 4.1  CL 112* 107 114* 111 113*  CO2 20* 20* 17* 18* 20*  GLUCOSE 145* 146* 91 93 100*  BUN '16  15 15 13 12  '$ CREATININE 1.58* 1.53* 1.60* 1.50* 1.44*  CALCIUM 8.8* 8.8* 9.0 8.5* 8.9   GFR Estimated Creatinine Clearance: 57.6 mL/min (A) (by C-G formula based on SCr of 1.44 mg/dL (H)). Liver Function Tests: Recent Labs  Lab 02/28/22 1136 02/28/22 1516 03/01/22 1115 03/04/22 0312  AST 48* 54* 59* 68*  ALT '19 22 23 21  '$ ALKPHOS 190* 181* 191* 171*  BILITOT 1.3* 1.6* 2.9* 1.6*  PROT 7.6 7.8 8.7* 7.5  ALBUMIN 3.2* 3.0* 3.2* 2.7*   No results for input(s): "LIPASE", "AMYLASE" in the last 168 hours. No results for input(s): "AMMONIA" in the last 168 hours. Coagulation profile No results for input(s): "INR", "PROTIME" in the last 168 hours.  CBC: Recent Labs  Lab 02/28/22 1136 02/28/22 1516 03/01/22 1115 03/03/22 0349 03/04/22 0312 03/05/22 0312 03/06/22 0258  WBC 7.4   < > 8.3 7.4 7.7 6.3 7.4  NEUTROABS 5.5  --   --   --   --   --   --   HGB 6.6 Repeated and verified X2.*   < > 8.6* 7.5* 7.3* 7.6* 9.1*  HCT 20.1 Repeated and verified X2.*   < > 27.3* 23.4* 22.9* 24.2* 28.0*  MCV 88.5   < > 90.7 89.7 89.8 90.6 87.5  PLT 203.0   < > 232 209 190 200 213   < > = values in this interval not displayed.   Cardiac Enzymes: No results for input(s): "CKTOTAL", "CKMB", "CKMBINDEX", "TROPONINI" in the last 168 hours. BNP: Invalid input(s): "POCBNP" CBG: No results for input(s): "GLUCAP" in the last 168 hours. D-Dimer No results for input(s): "DDIMER" in the last 72 hours. Hgb A1c No results for input(s): "HGBA1C" in the last 72 hours. Lipid Profile No results for input(s): "CHOL", "HDL", "LDLCALC", "TRIG", "CHOLHDL", "LDLDIRECT" in the last 72 hours. Thyroid function studies No results for input(s): "TSH", "T4TOTAL", "T3FREE", "THYROIDAB" in the last 72 hours.  Invalid input(s): "FREET3" Anemia work up No results for input(s): "VITAMINB12", "FOLATE", "FERRITIN", "TIBC", "IRON", "RETICCTPCT" in the last 72 hours. Microbiology No results found for this or any previous  visit (from the past 240 hour(s)).   Discharge Instructions:   Discharge Instructions     Diet general   Complete by: As directed    Discharge instructions   Complete by: As directed    CBC weekly Being referred to [facility] for double balloon endoscopy by GI   Increase activity slowly   Complete by: As directed       Allergies as of 03/06/2022       Reactions   Doxycycline    REACTION: sleepy   Omeprazole-sodium Bicarbonate    REACTION: diarrhea   Propranolol    tired        Medication List     TAKE these medications    ferrous sulfate 325 (65 FE) MG tablet Take 1 tablet (325 mg total) by mouth 2 (two) times daily with a meal.  folic acid 1 MG tablet Commonly known as: FOLVITE Take 1 tablet (1 mg total) by mouth daily.   hydrocortisone 2.5 % rectal cream Commonly known as: ANUSOL-HC Place rectally 2 (two) times daily. What changed:  how much to take when to take this reasons to take this   multivitamin with minerals Tabs tablet Take 1 tablet by mouth daily.   pantoprazole 40 MG tablet Commonly known as: PROTONIX Take 1 tablet (40 mg total) by mouth daily.   thiamine 100 MG tablet Take 1 tablet (100 mg total) by mouth daily.   zinc sulfate 50 MG Caps capsule Take 1 capsule (220 mg total) by mouth 2 (two) times daily.               Durable Medical Equipment  (From admission, onward)           Start     Ordered   03/06/22 0924  For home use only DME 3 n 1  Once        03/06/22 8119            Follow-up Information     Plotnikov, Evie Lacks, MD Follow up.   Specialty: Internal Medicine Why: will need weekly CBC until able to be seen at [facility] for double balloon enteroscopy Contact information: [place] [address] [phone]                  Time coordinating discharge: 45 min  Signed:  [name] DO  [facility] 03/06/2022, 3:13 PM

## 2022-03-07 ENCOUNTER — Other Ambulatory Visit: Payer: Self-pay

## 2022-03-07 ENCOUNTER — Telehealth: Payer: Self-pay

## 2022-03-07 DIAGNOSIS — D509 Iron deficiency anemia, unspecified: Secondary | ICD-10-CM

## 2022-03-07 LAB — TYPE AND SCREEN
ABO/RH(D): A POS
Antibody Screen: NEGATIVE
Unit division: 0

## 2022-03-07 LAB — BPAM RBC
Blood Product Expiration Date: 202307112359
ISSUE DATE / TIME: 202306171714
Unit Type and Rh: 6200

## 2022-03-07 NOTE — Telephone Encounter (Signed)
Pt was made aware of Dr. Lyndel Safe recommendations: Referral to [facility] for double balloon enteroscopy faxed to [phone] along with pt records: Pt made aware: Order for lab entered into epic: Pt made aware to have drawn in one week:  Office visit scheduled for 04/05/2022 at 9:30: Pt made aware:  Pt verbalized understanding with all questions answered.

## 2022-03-07 NOTE — Telephone Encounter (Signed)
Transition Care Management Follow-up Telephone Call Date of discharge and from where: [place] 03-06-22 Dx: GI bleed How have you been since you were released from the hospital? Doing good  Any questions or concerns? No  Items Reviewed: Did the pt receive and understand the discharge instructions provided? Yes  Medications obtained and verified? Yes  Other? No  Any new allergies since your discharge? No  Dietary orders reviewed? Yes Do you have support at home? Yes   Home Care and Equipment/Supplies: Were home health services ordered? no If so, what is the name of the agency? na  Has the agency set up a time to come to the patient's home? not applicable Were any new equipment or medical supplies ordered?  No What is the name of the medical supply agency? na Were you able to get the supplies/equipment? not applicable Do you have any questions related to the use of the equipment or supplies? No  Functional Questionnaire: (I = Independent and D = Dependent) ADLs: I  Bathing/Dressing- I  Meal Prep- I  Eating- I  Maintaining continence- I  Transferring/Ambulation- I  Managing Meds- I  Follow up appointments reviewed:  PCP Hospital f/u appt confirmed? Yes  Scheduled to see Dr Alain Marion on 03-15-22 @ 320pm. [facility] f/u appt confirmed? Yes  Scheduled to see Dr Lyndel Safe on 03-15-22 @ 930am. Are transportation arrangements needed? No  If their condition worsens, is the pt aware to call PCP or go to the Emergency Dept.? Yes Was the patient provided with contact information for the PCP's office or ED? Yes Was to pt encouraged to call back with questions or concerns? Yes

## 2022-03-07 NOTE — Telephone Encounter (Signed)
-----   Message from Jackquline Denmark, MD sent at 03/06/2022 12:05 PM EDT ----- Regarding: FW: Referral to [facility] for double balloon enteroscopy Remo Lipps, CBC in 1 week [facility] ref for DBE pl RG ----- Message ----- From: Thornton Park, MD Sent: 03/05/2022   1:42 PM EDT To: Gillermina Hu, RN; Jackquline Denmark, MD Subject: Referral to [facility] for double balloon enterosc#  Mr. Reffner was rehospitalized with profound iron deficiency anemia.  EGD, colonoscopy, and capsule endoscopy revealed for small bowel AVMs.  I attempted to treat these with APC.  However, endoscopy was limited due to gastric looping of the colonoscope.  All of the AVMs were not successfully treated.  Please arrange for an outpatient referral to [facility] for double-balloon enteroscopy.  He needs a repeat CBC in 1 week.  Outpatient follow-up with Dr. Lyndel Safe or any app in 3 to 4 weeks.  Thank you.

## 2022-03-08 ENCOUNTER — Telehealth: Payer: Self-pay | Admitting: Internal Medicine

## 2022-03-08 ENCOUNTER — Encounter (HOSPITAL_COMMUNITY): Payer: Self-pay | Admitting: Gastroenterology

## 2022-03-08 LAB — SURGICAL PATHOLOGY

## 2022-03-08 NOTE — Telephone Encounter (Signed)
Katherin from [place] called requesting Verbal Orders for Home Health 1 time a week for 6 weeks.  Contact number for Jacelyn Grip is [phone]. She said it is ok to leave a message.

## 2022-03-14 ENCOUNTER — Other Ambulatory Visit (INDEPENDENT_AMBULATORY_CARE_PROVIDER_SITE_OTHER): Payer: Medicare HMO

## 2022-03-14 DIAGNOSIS — D509 Iron deficiency anemia, unspecified: Secondary | ICD-10-CM | POA: Diagnosis not present

## 2022-03-14 LAB — CBC WITH DIFFERENTIAL/PLATELET
Basophils Absolute: 0 10*3/uL (ref 0.0–0.1)
Basophils Relative: 0.5 % (ref 0.0–3.0)
Eosinophils Absolute: 0.2 10*3/uL (ref 0.0–0.7)
Eosinophils Relative: 2.6 % (ref 0.0–5.0)
HCT: 27.9 % — ABNORMAL LOW (ref 39.0–52.0)
Hemoglobin: 9.3 g/dL — ABNORMAL LOW (ref 13.0–17.0)
Lymphocytes Relative: 14.4 % (ref 12.0–46.0)
Lymphs Abs: 1 10*3/uL (ref 0.7–4.0)
MCHC: 33.2 g/dL (ref 30.0–36.0)
MCV: 87.4 fl (ref 78.0–100.0)
Monocytes Absolute: 0.7 10*3/uL (ref 0.1–1.0)
Monocytes Relative: 9.5 % (ref 3.0–12.0)
Neutro Abs: 5 10*3/uL (ref 1.4–7.7)
Neutrophils Relative %: 73 % (ref 43.0–77.0)
Platelets: 143 10*3/uL — ABNORMAL LOW (ref 150.0–400.0)
RBC: 3.19 Mil/uL — ABNORMAL LOW (ref 4.22–5.81)
RDW: 17 % — ABNORMAL HIGH (ref 11.5–15.5)
WBC: 6.8 10*3/uL (ref 4.0–10.5)

## 2022-03-15 ENCOUNTER — Encounter: Payer: Self-pay | Admitting: Internal Medicine

## 2022-03-15 ENCOUNTER — Ambulatory Visit (INDEPENDENT_AMBULATORY_CARE_PROVIDER_SITE_OTHER): Payer: Medicare HMO | Admitting: Gastroenterology

## 2022-03-15 ENCOUNTER — Ambulatory Visit (INDEPENDENT_AMBULATORY_CARE_PROVIDER_SITE_OTHER): Payer: Medicare HMO | Admitting: Internal Medicine

## 2022-03-15 ENCOUNTER — Encounter: Payer: Self-pay | Admitting: Gastroenterology

## 2022-03-15 VITALS — BP 100/62 | HR 103 | Ht 72.0 in | Wt 165.5 lb

## 2022-03-15 DIAGNOSIS — K5520 Angiodysplasia of colon without hemorrhage: Secondary | ICD-10-CM | POA: Diagnosis not present

## 2022-03-15 DIAGNOSIS — K7030 Alcoholic cirrhosis of liver without ascites: Secondary | ICD-10-CM | POA: Diagnosis not present

## 2022-03-15 DIAGNOSIS — R932 Abnormal findings on diagnostic imaging of liver and biliary tract: Secondary | ICD-10-CM

## 2022-03-15 DIAGNOSIS — F10288 Alcohol dependence with other alcohol-induced disorder: Secondary | ICD-10-CM | POA: Diagnosis not present

## 2022-03-15 DIAGNOSIS — E538 Deficiency of other specified B group vitamins: Secondary | ICD-10-CM

## 2022-03-15 DIAGNOSIS — E519 Thiamine deficiency, unspecified: Secondary | ICD-10-CM

## 2022-03-15 DIAGNOSIS — K7460 Unspecified cirrhosis of liver: Secondary | ICD-10-CM | POA: Diagnosis not present

## 2022-03-15 DIAGNOSIS — K31811 Angiodysplasia of stomach and duodenum with bleeding: Secondary | ICD-10-CM | POA: Diagnosis not present

## 2022-03-15 DIAGNOSIS — K219 Gastro-esophageal reflux disease without esophagitis: Secondary | ICD-10-CM

## 2022-03-15 MED ORDER — VITAMIN D3 50 MCG (2000 UT) PO CAPS: 2000.0000 [IU] | ORAL_CAPSULE | Freq: Every day | ORAL | 3 refills | Status: AC

## 2022-03-15 MED ORDER — PANTOPRAZOLE SODIUM 40 MG PO TBEC: 40.0000 mg | DELAYED_RELEASE_TABLET | Freq: Every day | ORAL | 3 refills | Status: DC

## 2022-03-15 MED ORDER — B COMPLEX PLUS PO TABS: 1.0000 | ORAL_TABLET | Freq: Every day | ORAL | 3 refills | Status: AC

## 2022-03-15 NOTE — Progress Notes (Signed)
Subjective:  Patient ID: Johnny Mcknight, male    DOB: 11/17/1955  Age: 66 y.o. MRN: [id number]  CC: No chief complaint on file.   HPI Johnny Mcknight presents for liver cirrhosis, recent GI bleed, alcoholism. Not drinking since Feb 16, 2022. He is here w/his little sister. He lives w/2 sisters.  Per Dr Lyndel Safe:   #1. Recurrent SB bleeding d/t SB AVMs on VCE. Not accessible via push enteroscopy. Also with small duodenal polyp in 4th portion (Bx-tubular adenoma)   #2. 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 No ascites or HE.   #3. GERD   #4. Abn CT as below with Nl AFP. R/O Tucker   Plan -DBE at [facility] for SB AVMs and duodenal polypectomy (ref in process) -Continue protonix '40mg'$  po QD #90, 4RF -Noncontrast MRI (open) liver, as suggested by radiology.  Pt gets claustrophobic despite sedation.  Hence, we will try to get open MRI approved. -No NSAIDs -No ETOH -Trend CBC, CMP, INR, AFP -FU in 24 weeks. Needs vaccines for hep B at FU. -D/W in detail with pt and pt's sister     Outpatient Medications Prior to Visit  Medication Sig Dispense Refill   ferrous sulfate 325 (65 FE) MG tablet Take 1 tablet (325 mg total) by mouth 2 (two) times daily with a meal. 60 tablet 1   folic acid (FOLVITE) 1 MG tablet Take 1 tablet (1 mg total) by mouth daily. 30 tablet 1   hydrocortisone (ANUSOL-HC) 2.5 % rectal cream Place rectally 2 (two) times daily. (Patient taking differently: Place 1 application  rectally 2 (two) times daily as needed for hemorrhoids.) 30 g 0   Multiple Vitamin (MULTIVITAMIN WITH MINERALS) TABS tablet Take 1 tablet by mouth daily. 30 tablet 1   pantoprazole (PROTONIX) 40 MG tablet Take 1 tablet (40 mg total) by mouth daily. 90 tablet 3   thiamine 100 MG tablet Take 1 tablet (100 mg total) by mouth daily. 30 tablet 1   zinc sulfate 50 MG CAPS capsule Take 1 capsule (220 mg total) by mouth 2 (two) times daily. 60 capsule 1   No facility-administered medications prior to  visit.    ROS: Review of Systems  Constitutional:  Negative for appetite change, fatigue and unexpected weight change.  HENT:  Negative for congestion, nosebleeds, sneezing, sore throat and trouble swallowing.   Eyes:  Negative for itching and visual disturbance.  Respiratory:  Negative for cough.   Cardiovascular:  Negative for chest pain, palpitations and leg swelling.  Gastrointestinal:  Negative for abdominal distention, blood in stool, diarrhea and nausea.  Genitourinary:  Negative for frequency and hematuria.  Musculoskeletal:  Negative for back pain, gait problem, joint swelling and neck pain.  Skin:  Negative for rash.  Neurological:  Negative for dizziness, tremors, speech difficulty and weakness.  Psychiatric/Behavioral:  Negative for agitation, dysphoric mood and sleep disturbance. The patient is not nervous/anxious.     Objective:  BP 120/68 (BP Location: Left Arm, Patient Position: Sitting, Cuff Size: Normal)   Pulse 94   Temp (!) 100.9 F (38.3 C) (Oral)   Ht 6' (1.829 m)   Wt 164 lb (74.4 kg)   SpO2 96%   BMI 22.24 kg/m   BP Readings from Last 3 Encounters:  03/15/22 120/68  03/15/22 100/62  03/06/22 137/82    Wt Readings from Last 3 Encounters:  03/15/22 164 lb (74.4 kg)  03/15/22 165 lb 8 oz (75.1 kg)  03/05/22 174 lb 9.7 oz (79.2  kg)    Physical Exam Constitutional:      General: He is not in acute distress.    Appearance: Normal appearance. He is well-developed.     Comments: NAD  Eyes:     Conjunctiva/sclera: Conjunctivae normal.     Pupils: Pupils are equal, round, and reactive to light.  Neck:     Thyroid: No thyromegaly.     Vascular: No JVD.  Cardiovascular:     Rate and Rhythm: Normal rate and regular rhythm.     Heart sounds: Normal heart sounds. No murmur heard.    No friction rub. No gallop.  Pulmonary:     Effort: Pulmonary effort is normal. No respiratory distress.     Breath sounds: Normal breath sounds. No wheezing or rales.   Chest:     Chest wall: No tenderness.  Abdominal:     General: Bowel sounds are normal. There is no distension.     Palpations: Abdomen is soft. There is no mass.     Tenderness: There is no abdominal tenderness. There is no guarding or rebound.  Musculoskeletal:        General: No tenderness. Normal range of motion.     Cervical back: Normal range of motion.  Lymphadenopathy:     Cervical: No cervical adenopathy.  Skin:    General: Skin is warm and dry.     Findings: No rash.  Neurological:     Mental Status: He is alert and oriented to person, place, and time.     Cranial Nerves: No cranial nerve deficit.     Motor: No abnormal muscle tone.     Coordination: Coordination normal.     Gait: Gait abnormal.     Deep Tendon Reflexes: Reflexes are normal and symmetric.  Psychiatric:        Behavior: Behavior normal.        Thought Content: Thought content normal.        Judgment: Judgment normal.   Mildly ataxic    A total time of 45 minutes was spent preparing to see the patient, reviewing tests, x-rays, operative reports and other medical records.  Also, obtaining history and performing comprehensive physical exam.  Additionally, counseling the patient regarding the above listed issues -alcoholism, liver cirrhosis, risk of GI bleeding.   Finally, documenting clinical information in the health records, coordination of care, educating the patient. It is a complex case.   Lab Results  Component Value Date   WBC 6.8 03/14/2022   HGB 9.3 (L) 03/14/2022   HCT 27.9 (L) 03/14/2022   PLT 143.0 (L) 03/14/2022   GLUCOSE 100 (H) 03/05/2022   CHOL 157 08/20/2018   TRIG 79.0 08/20/2018   HDL 84.20 08/20/2018   LDLCALC 57 08/20/2018   ALT 21 03/04/2022   AST 68 (H) 03/04/2022   NA 138 03/05/2022   K 4.1 03/05/2022   CL 113 (H) 03/05/2022   CREATININE 1.44 (H) 03/05/2022   BUN 12 03/05/2022   CO2 20 (L) 03/05/2022   TSH 2.43 03/24/2020   PSA 1.76 08/20/2018   INR 1.2 02/16/2022    HGBA1C 4.6 04/03/2018    No results found.  Assessment & Plan:   Problem List Items Addressed This Visit     Alcohol dependence with other alcohol-induced disorder ([place])     In remission since Feb 16, 2022.  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 No ascites or HE. Darren is bored at home.  He wants to get a  dog.  She will try to do more chores.  No interest in going to [employer].      B12 deficiency    Continue with vitamin B complex      Gastric and duodenal angiodysplasia with hemorrhage    Recurrent SB bleeding d/t SB AVMs on VCE. Not accessible via push enteroscopy. Also with small duodenal polyp in 4th portion (Bx-tubular adenoma) Stable.  No clinical signs of bleeding      Liver cirrhosis, alcoholic (HCC)    Currently alcoholism is in remission since Feb 16, 2022.  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 No ascites or HE. All related issues discussed with the patient and he has 2 sisters      Thiamine deficiency    Use vitamin B complex         Meds ordered this encounter  Medications   Cholecalciferol (VITAMIN D3) 50 MCG (2000 UT) capsule    Sig: Take 1 capsule (2,000 Units total) by mouth daily.    Dispense:  100 capsule    Refill:  3   B Complex-Folic Acid (B COMPLEX PLUS) TABS    Sig: Take 1 tablet by mouth daily.    Dispense:  100 tablet    Refill:  3      Follow-up: Return in about 3 months (around 06/15/2022) for a follow-up visit.  Walker Kehr, MD

## 2022-03-27 ENCOUNTER — Ambulatory Visit
Admission: RE | Admit: 2022-03-27 | Discharge: 2022-03-27 | Disposition: A | Discharge status: Home / Self Care | Payer: Medicare HMO | Source: Ambulatory Visit | Attending: Gastroenterology | Admitting: Gastroenterology

## 2022-03-27 DIAGNOSIS — K7460 Unspecified cirrhosis of liver: Secondary | ICD-10-CM

## 2022-03-27 DIAGNOSIS — K219 Gastro-esophageal reflux disease without esophagitis: Secondary | ICD-10-CM

## 2022-03-27 DIAGNOSIS — K7689 Other specified diseases of liver: Secondary | ICD-10-CM | POA: Diagnosis not present

## 2022-03-27 DIAGNOSIS — K766 Portal hypertension: Secondary | ICD-10-CM | POA: Diagnosis not present

## 2022-03-27 DIAGNOSIS — K5520 Angiodysplasia of colon without hemorrhage: Secondary | ICD-10-CM

## 2022-03-27 DIAGNOSIS — K5730 Diverticulosis of large intestine without perforation or abscess without bleeding: Secondary | ICD-10-CM | POA: Diagnosis not present

## 2022-03-27 DIAGNOSIS — R932 Abnormal findings on diagnostic imaging of liver and biliary tract: Secondary | ICD-10-CM

## 2022-03-27 DIAGNOSIS — K7030 Alcoholic cirrhosis of liver without ascites: Secondary | ICD-10-CM | POA: Insufficient documentation

## 2022-03-27 MED ORDER — GADOBENATE DIMEGLUMINE 529 MG/ML IV SOLN
14.0000 mL | Freq: Once | INTRAVENOUS | Status: AC | PRN
Start: 2022-03-27 — End: 2022-03-27
  Administered 2022-03-27: 14 mL via INTRAVENOUS

## 2022-03-27 NOTE — Assessment & Plan Note (Signed)
Use vitamin B complex

## 2022-03-27 NOTE — Assessment & Plan Note (Signed)
Continue with vitamin B complex

## 2022-03-27 NOTE — Assessment & Plan Note (Signed)
Recurrent SB bleeding d/t SB AVMs on VCE. Not accessible via push enteroscopy. Also with small duodenal polyp in 4th portion (Bx-tubular adenoma) Stable.  No clinical signs of bleeding

## 2022-03-27 NOTE — Assessment & Plan Note (Signed)
Currently alcoholism is in remission since Feb 16, 2022.  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 No ascites or HE. All related issues discussed with the patient and he has 2 sisters

## 2022-03-27 NOTE — Assessment & Plan Note (Signed)
In remission since Feb 16, 2022.  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 No ascites or HE. Johnny Mcknight is bored at home.  He wants to get a dog.  She will try to do more chores.  No interest in going to [employer].

## 2022-04-05 ENCOUNTER — Ambulatory Visit: Payer: Medicare HMO | Admitting: Gastroenterology

## 2022-04-07 DIAGNOSIS — K760 Fatty (change of) liver, not elsewhere classified: Secondary | ICD-10-CM | POA: Diagnosis not present

## 2022-04-07 DIAGNOSIS — Z9181 History of falling: Secondary | ICD-10-CM

## 2022-04-07 DIAGNOSIS — K8020 Calculus of gallbladder without cholecystitis without obstruction: Secondary | ICD-10-CM

## 2022-04-07 DIAGNOSIS — E559 Vitamin D deficiency, unspecified: Secondary | ICD-10-CM

## 2022-04-07 DIAGNOSIS — I119 Hypertensive heart disease without heart failure: Secondary | ICD-10-CM | POA: Diagnosis not present

## 2022-04-07 DIAGNOSIS — I081 Rheumatic disorders of both mitral and tricuspid valves: Secondary | ICD-10-CM | POA: Diagnosis not present

## 2022-04-07 DIAGNOSIS — K219 Gastro-esophageal reflux disease without esophagitis: Secondary | ICD-10-CM

## 2022-04-07 DIAGNOSIS — K7460 Unspecified cirrhosis of liver: Secondary | ICD-10-CM | POA: Diagnosis not present

## 2022-04-07 DIAGNOSIS — I700 Atherosclerosis of aorta: Secondary | ICD-10-CM

## 2022-04-07 DIAGNOSIS — E611 Iron deficiency: Secondary | ICD-10-CM

## 2022-04-07 DIAGNOSIS — K2970 Gastritis, unspecified, without bleeding: Secondary | ICD-10-CM

## 2022-04-07 DIAGNOSIS — E518 Other manifestations of thiamine deficiency: Secondary | ICD-10-CM | POA: Diagnosis not present

## 2022-04-07 DIAGNOSIS — Z8701 Personal history of pneumonia (recurrent): Secondary | ICD-10-CM

## 2022-04-07 DIAGNOSIS — F10288 Alcohol dependence with other alcohol-induced disorder: Secondary | ICD-10-CM

## 2022-04-07 DIAGNOSIS — E8809 Other disorders of plasma-protein metabolism, not elsewhere classified: Secondary | ICD-10-CM

## 2022-04-07 DIAGNOSIS — I8500 Esophageal varices without bleeding: Secondary | ICD-10-CM | POA: Diagnosis not present

## 2022-04-07 DIAGNOSIS — K5730 Diverticulosis of large intestine without perforation or abscess without bleeding: Secondary | ICD-10-CM

## 2022-04-07 DIAGNOSIS — K766 Portal hypertension: Secondary | ICD-10-CM | POA: Diagnosis not present

## 2022-04-07 DIAGNOSIS — D649 Anemia, unspecified: Secondary | ICD-10-CM | POA: Diagnosis not present

## 2022-04-07 DIAGNOSIS — F17290 Nicotine dependence, other tobacco product, uncomplicated: Secondary | ICD-10-CM

## 2022-04-07 DIAGNOSIS — E60 Dietary zinc deficiency: Secondary | ICD-10-CM

## 2022-04-07 DIAGNOSIS — G621 Alcoholic polyneuropathy: Secondary | ICD-10-CM

## 2022-04-07 DIAGNOSIS — K644 Residual hemorrhoidal skin tags: Secondary | ICD-10-CM

## 2022-04-07 DIAGNOSIS — I2510 Atherosclerotic heart disease of native coronary artery without angina pectoris: Secondary | ICD-10-CM | POA: Diagnosis not present

## 2022-04-07 DIAGNOSIS — K648 Other hemorrhoids: Secondary | ICD-10-CM

## 2022-04-07 DIAGNOSIS — M47812 Spondylosis without myelopathy or radiculopathy, cervical region: Secondary | ICD-10-CM

## 2022-04-07 DIAGNOSIS — Z86010 Personal history of colonic polyps: Secondary | ICD-10-CM

## 2022-04-09 DIAGNOSIS — H5203 Hypermetropia, bilateral: Secondary | ICD-10-CM | POA: Diagnosis not present

## 2022-04-09 DIAGNOSIS — H524 Presbyopia: Secondary | ICD-10-CM | POA: Diagnosis not present

## 2022-05-27 DIAGNOSIS — K7030 Alcoholic cirrhosis of liver without ascites: Secondary | ICD-10-CM | POA: Diagnosis not present

## 2022-05-27 DIAGNOSIS — D500 Iron deficiency anemia secondary to blood loss (chronic): Secondary | ICD-10-CM | POA: Diagnosis not present

## 2022-05-30 ENCOUNTER — Ambulatory Visit (INDEPENDENT_AMBULATORY_CARE_PROVIDER_SITE_OTHER): Payer: Medicare HMO

## 2022-05-30 VITALS — Ht 72.0 in | Wt 160.0 lb

## 2022-05-30 DIAGNOSIS — Z0000 Encounter for general adult medical examination without abnormal findings: Secondary | ICD-10-CM | POA: Diagnosis not present

## 2022-05-30 NOTE — Progress Notes (Cosign Needed Addendum)
Subjective:   Johnny Mcknight is a 66 y.o. male who presents for Medicare Annual/Subsequent preventive examination.  Review of Systems   Virtual Visit via Telephone Note  I connected with  Johnny Mcknight on 05/30/22 at  9:45 AM EDT by telephone and verified that I am speaking with the correct person using two identifiers.  Location: Patient: Home Provider: [place] Persons participating in the virtual visit: [place]   I discussed the limitations, risks, security and privacy concerns of performing an evaluation and management service by telephone and the availability of in person appointments. The patient expressed understanding and agreed to proceed.  Interactive audio and video telecommunications were attempted between this nurse and patient, however failed, due to patient having technical difficulties OR patient did not have access to video capability.  We continued and completed visit with audio only.  Some vital signs may be absent or patient reported.   Sheral Flow, LPN  Cardiac Risk Factors include: advanced age (>21mn, >>45women);family history of premature cardiovascular disease;sedentary lifestyle;male gender     Objective:    Today's Vitals   05/30/22 0948  Weight: 160 lb (72.6 kg)  Height: 6' (1.829 m)   Body mass index is 21.7 kg/m.     05/30/2022    9:50 AM 03/05/2022   12:47 PM 03/02/2022    1:28 PM 02/28/2022    5:44 PM 02/28/2022    2:30 PM 02/16/2022    3:00 PM 02/16/2022   10:46 AM  Advanced Directives  Does Patient Have a Medical Advance Directive? No No No No No  No  Would patient like information on creating a medical advance directive? No - Patient declined No - Patient declined  No - Patient declined  No - Patient declined     Current Medications (verified) Outpatient Encounter Medications as of 05/30/2022  Medication Sig   B Complex-Folic Acid (B COMPLEX PLUS) TABS Take 1 tablet by mouth daily.   Cholecalciferol  (VITAMIN D3) 50 MCG (2000 UT) capsule Take 1 capsule (2,000 Units total) by mouth daily.   hydrocortisone (ANUSOL-HC) 2.5 % rectal cream Place rectally 2 (two) times daily. (Patient taking differently: Place 1 application  rectally 2 (two) times daily as needed for hemorrhoids.)   pantoprazole (PROTONIX) 40 MG tablet Take 1 tablet (40 mg total) by mouth daily.   ferrous sulfate 325 (65 FE) MG tablet Take 1 tablet (325 mg total) by mouth 2 (two) times daily with a meal.   No facility-administered encounter medications on file as of 05/30/2022.    Allergies (verified) Doxycycline, Omeprazole-sodium bicarbonate, and Propranolol   History: Past Medical History:  Diagnosis Date   GERD (gastroesophageal reflux disease)    Polyneuropathy, alcoholic ([place] [id number]  Gabapentin prn d/c B complex po 11/22 Re-start B complex po Risks associated with treatment noncompliance were discussed. Compliance was encouraged. [place]   Streptococcal pneumonia ([place] 09/27/2015   1/17   Thiamine deficiency 10/02/2020   Alcohol related.  On B complex Risks associated with treatment noncompliance were discussed. Compliance was encouraged.    Vitamin D deficiency 05/14/2018   2019   Zinc deficiency 04/03/2020   2021 Zinc 220 mg bid Risks associated with treatment noncompliance were discussed. Compliance was encouraged.   Past Surgical History:  Procedure Laterality Date   BIOPSY  02/18/2022   Procedure: BIOPSY;  Surgeon: GJackquline Denmark MD;  [facility];  Service: Gastroenterology;;   BIOPSY  03/02/2022   Procedure: BIOPSY;  Surgeon: BThornton Park  MD;  [facility]: [facility];  Service: Gastroenterology;;   BIOPSY  03/05/2022   Procedure: BIOPSY;  Surgeon: Thornton Park, MD;  Location: [facility];  Service: Gastroenterology;;   COLONOSCOPY WITH PROPOFOL N/A 02/18/2022   Procedure: COLONOSCOPY WITH PROPOFOL;  Surgeon: Jackquline Denmark, MD;  Location: [facility];  Service: Gastroenterology;   Laterality: N/A;   COLONOSCOPY WITH PROPOFOL N/A 03/02/2022   Procedure: COLONOSCOPY WITH PROPOFOL;  Surgeon: Thornton Park, MD;  Location: [facility];  Service: Gastroenterology;  Laterality: N/A;   ENTEROSCOPY N/A 03/05/2022   Procedure: ENTEROSCOPY;  Surgeon: Thornton Park, MD;  [facility];  Service: Gastroenterology;  Laterality: N/A;   ESOPHAGOGASTRODUODENOSCOPY (EGD) WITH PROPOFOL N/A 02/18/2022   Procedure: ESOPHAGOGASTRODUODENOSCOPY (EGD) WITH PROPOFOL;  Surgeon: Jackquline Denmark, MD;  [facility];  Service: Gastroenterology;  Laterality: N/A;   ESOPHAGOGASTRODUODENOSCOPY (EGD) WITH PROPOFOL N/A 03/03/2022   Procedure: ESOPHAGOGASTRODUODENOSCOPY (EGD) WITH PROPOFOL;  Surgeon: Thornton Park, MD;  Location: [facility];  Service: Gastroenterology;  Laterality: N/A;   GIVENS CAPSULE STUDY  03/03/2022   Procedure: GIVENS CAPSULE STUDY;  Surgeon: Thornton Park, MD;  Location: [facility];  Service: Gastroenterology;;   HOT HEMOSTASIS N/A 03/05/2022   Procedure: HOT HEMOSTASIS (ARGON PLASMA COAGULATION/BICAP);  Surgeon: Thornton Park, MD;  [facility];  Service: Gastroenterology;  Laterality: N/A;   POLYPECTOMY  02/18/2022   Procedure: POLYPECTOMY;  Surgeon: Jackquline Denmark, MD;  Location[facility];  Service: Gastroenterology;;   TEE WITHOUT CARDIOVERSION N/A 09/29/2015   Procedure: TRANSESOPHAGEAL ECHOCARDIOGRAM (TEE);  Surgeon: Jerline Pain, MD;  Location[facility];  Service: Cardiovascular;  Laterality: N/A;   Family History  Problem Relation Age of Onset   Hypertension Other    Heart disease Father 75       MI   Hypertension Father    Hypertension Mother    Diabetes Mother    Diabetes Maternal Aunt    Stroke Maternal Aunt    Diabetes Maternal Grandmother    Social History   Socioeconomic History   Marital status: Widowed    Spouse name: Not on file   Number of children: Not on file   Years of education: Not on file    Highest education level: Not on file  Occupational History   Occupation: Disabled.  Tobacco Use   Smoking status: Some Days    Types: Pipe   Smokeless tobacco: Never  Vaping Use   Vaping Use: Never used  Substance and Sexual Activity   Alcohol use: Yes    Comment: vodka 1/2 pint a day   Drug use: No   Sexual activity: Yes  Other Topics Concern   Not on file  Social History Narrative   Lives with wife.     Social Determinants of Health   Financial Resource Strain: Low Risk  (05/30/2022)   Overall Financial Resource Strain (CARDIA)    Difficulty of Paying Living Expenses: Not hard at all  Food Insecurity: No Food Insecurity (05/30/2022)   Hunger Vital Sign    Worried About Running Out of Food in the Last Year: Never true    Ran Out of Food in the Last Year: Never true  Transportation Needs: No Transportation Needs (05/30/2022)   PRAPARE - Hydrologist (Medical): No    Lack of Transportation (Non-Medical): No  Physical Activity: Inactive (05/30/2022)   Exercise Vital Sign    Days of Exercise per Week: 0 days    Minutes of Exercise per Session: 0 min  Stress: No  Stress Concern Present (05/30/2022)   [place]    Feeling of Stress : Not at all  Social Connections: [place] (05/30/2022)   Social Connection and Isolation Panel [NHANES]    Frequency of Communication with Friends and Family: More than three times a week    Frequency of Social Gatherings with Friends and Family: More than three times a week    Attends Religious Services: More than 4 times per year    Active Member of [employer] or Organizations: Yes    Attends Music therapist: More than 4 times per year    Marital Status: Married    Tobacco Counseling Ready to quit: Not Answered Counseling given: Not Answered   Clinical Intake:  Pre-visit preparation completed: Yes  Pain : No/denies pain      BMI - recorded: 21.7 Nutritional Risks: None Diabetes: No  How often do you need to have someone help you when you read instructions, pamphlets, or other written materials from your doctor or pharmacy?: 1 - Never What is the last grade level you completed in school?: HSG; 1 year of college  Diabetic? no  Interpreter Needed?: No  Information entered by :: Lisette Abu, LPN.   Activities of Daily Living    05/30/2022   11:11 AM 02/28/2022    5:46 PM  In your present state of health, do you have any difficulty performing the following activities:  Hearing? 0   Vision? 0   Difficulty concentrating or making decisions? 0   Walking or climbing stairs? 0   Dressing or bathing? 0   Doing errands, shopping? 0 1  Preparing Food and eating ? N   Using the Toilet? N   In the past six months, have you accidently leaked urine? N   Do you have problems with loss of bowel control? N   Managing your Medications? N   Managing your Finances? N   Housekeeping or managing your Housekeeping? N     Patient Care Team: Plotnikov, Evie Lacks, MD as PCP - General (Internal Medicine)  Indicate any recent Medical Services you may have received from other than [facility] providers in the past year (date may be approximate).     Assessment:   This is a routine wellness examination for Johnny Mcknight.  Hearing/Vision screen Hearing Screening - Comments:: Denies hearing difficulties   Vision Screening - Comments:: Wears rx glasses - up to date with routine eye exams with [facility]   Dietary issues and exercise activities discussed: Current Exercise Habits: The patient does not participate in regular exercise at present, Exercise limited by: None identified   Goals Addressed             This Visit's Progress    Patient Stated       To maintain my current health status by continuing to eat healthy, stay physically active and socially active.        Depression Screen    05/30/2022   11:10  AM 05/30/2022    9:53 AM 05/27/2021   10:23 AM 12/23/2020   10:27 AM 06/24/2020   10:41 AM 06/24/2019    9:50 AM 04/03/2018    9:50 AM  PHQ 2/9 Scores  PHQ - 2 Score 0 0 0 0 0 0 0    Fall Risk    05/30/2022    9:52 AM 05/27/2021   10:22 AM 04/26/2021   10:16 AM 12/23/2020   10:27 AM 04/03/2018  9:50 AM  [place] in the past year? 0 0 1 1 Yes  Number falls in past yr: 1 0 0 0 1  Injury with Fall? 1 0 1 0 No  Risk for fall due to : History of fall(s);Impaired balance/gait No Fall Risks Impaired balance/gait;Impaired mobility Impaired balance/gait   Risk for fall due to: Comment    pt now carry his cane w/him   Follow up Falls prevention discussed Falls evaluation completed       [place]:  Any stairs in or around the home? Yes  If so, are there any without handrails? No  Home free of loose throw rugs in walkways, pet beds, electrical cords, etc? Yes  Adequate lighting in your home to reduce risk of falls? Yes   ASSISTIVE DEVICES UTILIZED TO PREVENT FALLS:  Life alert? No  Use of a cane, walker or w/c? Yes  Grab bars in the bathroom? No  Shower chair or bench in shower? Yes  Elevated toilet seat or a handicapped toilet? Yes   TIMED UP AND GO:  Was the test performed? No .  Length of time to ambulate 10 feet: n/a sec.   Appearance of gait: Gait not evaluated during this visit.  Cognitive Function:        05/30/2022   11:11 AM  6CIT Screen  What Year? 0 points  What month? 0 points  What time? 0 points  Count back from 20 0 points  Months in reverse 0 points  Repeat phrase 0 points  Total Score 0 points    Immunizations Immunization History  Administered Date(s) Administered   PFIZER(Purple Top)SARS-COV-2 Vaccination 12/19/2019, 01/01/2020   Pneumococcal Polysaccharide-23 09/24/2020    TDAP status: declined  Flu Vaccine status: Declined, Education has been provided regarding the importance of this vaccine but patient  still declined. Advised may receive this vaccine at local pharmacy or Health Dept. Aware to provide a copy of the vaccination record if obtained from local pharmacy or Health Dept. Verbalized acceptance and understanding.  Pneumococcal vaccine status: Up to date  Covid-19 vaccine status: Completed vaccines  Qualifies for Shingles Vaccine? Yes   Zostavax completed No   Shingrix Completed?: No.    Education has been provided regarding the importance of this vaccine. Patient has been advised to call insurance company to determine out of pocket expense if they have not yet received this vaccine. Advised may also receive vaccine at local pharmacy or Health Dept. Verbalized acceptance and understanding.  Screening Tests Health Maintenance  Topic Date Due   TETANUS/TDAP  Never done   Zoster Vaccines- Shingrix (1 of 2) Never done   COVID-19 Vaccine (3 - Pfizer risk series) 01/29/2020   COLONOSCOPY (Pts 45-70yr Insurance coverage will need to be confirmed)  03/02/2032   Hepatitis C Screening  Completed   HIV Screening  Completed   HPV VACCINES  Aged Out   INFLUENZA VACCINE  Discontinued    Health Maintenance  Health Maintenance Due  Topic Date Due   TETANUS/TDAP  Never done   Zoster Vaccines- Shingrix (1 of 2) Never done   COVID-19 Vaccine (3 - Pfizer risk series) 01/29/2020    Colorectal cancer screening: Type of screening: Colonoscopy. Completed 03/02/2022. Repeat every 10 years  Lung Cancer Screening: (Low Dose CT Chest recommended if Age 632-80years, 30 pack-year currently smoking OR have quit w/in 15years.) does not qualify.   Lung Cancer Screening Referral: no  Additional Screening:  Hepatitis C Screening: does qualify; Completed 09/29/2015  Vision Screening: Recommended annual ophthalmology exams for early detection of glaucoma and other disorders of the eye. Is the patient up to date with their annual eye exam?  Yes  Who is the provider or what is the name of the office in  which the patient attends annual eye exams? [facility] If pt is not established with a provider, would they like to be referred to a provider to establish care? No .   Dental Screening: Recommended annual dental exams for proper oral hygiene  Community Resource Referral / Chronic Care Management: CRR required this visit?  No   CCM required this visit?  No      Plan:     I have personally reviewed and noted the following in the patient's chart:   Medical and social history Use of alcohol, tobacco or illicit drugs  Current medications and supplements including opioid prescriptions. Patient is not currently taking opioid prescriptions. Functional ability and status Nutritional status Physical activity Advanced directives List of other physicians Hospitalizations, surgeries, and ER visits in previous 12 months Vitals Screenings to include cognitive, depression, and falls Referrals and appointments  In addition, I have reviewed and discussed with patient certain preventive protocols, quality metrics, and best practice recommendations. A written personalized care plan for preventive services as well as general preventive health recommendations were provided to patient.     Sheral Flow, LPN   06/11/3006   Nurse Notes:  Patient is cogitatively intact. There were no vitals filed for this visit.  Medical screening examination/treatment/procedure(s) were performed by non-physician practitioner and as supervising physician I was immediately available for consultation/collaboration.  I agree with above. Lew Dawes, MD

## 2022-05-30 NOTE — Patient Instructions (Signed)
Johnny Mcknight , Thank you for taking time to come for your Medicare Wellness Visit. I appreciate your ongoing commitment to your health goals. Please review the following plan we discussed and let me know if I can assist you in the future.   Screening recommendations/referrals: Colonoscopy: 03/02/2022; due every 10 years Recommended yearly ophthalmology/optometry visit for glaucoma screening and checkup Recommended yearly dental visit for hygiene and checkup  Vaccinations: Influenza vaccine: declined Pneumococcal vaccine: 09/24/2020 Tdap vaccine: declined Shingles vaccine: declined   Covid-19: 12/19/2019 01/01/2020  Advanced directives: No  Conditions/risks identified: Yes  Next appointment: Follow up in one year for your annual wellness visit on 06/01/2023 at 9:15 a.m. via telephone with Nurse Mignon Pine.  Preventive Care 40-64 Years, Male Preventive care refers to lifestyle choices and visits with your health care provider that can promote health and wellness. What does preventive care include? A yearly physical exam. This is also called an annual well check. Dental exams once or twice a year. Routine eye exams. Ask your health care provider how often you should have your eyes checked. Personal lifestyle choices, including: Daily care of your teeth and gums. Regular physical activity. Eating a healthy diet. Avoiding tobacco and drug use. Limiting alcohol use. Practicing safe sex. Taking low-dose aspirin every day starting at age 54. What happens during an annual well check? The services and screenings done by your health care provider during your annual well check will depend on your age, overall health, lifestyle risk factors, and family history of disease. Counseling  Your health care provider may ask you questions about your: Alcohol use. Tobacco use. Drug use. Emotional well-being. Home and relationship well-being. Sexual activity. Eating habits. Work and work  Statistician. Screening  You may have the following tests or measurements: Height, weight, and BMI. Blood pressure. Lipid and cholesterol levels. These may be checked every 5 years, or more frequently if you are over 24 years old. Skin check. Lung cancer screening. You may have this screening every year starting at age 33 if you have a 30-pack-year history of smoking and currently smoke or have quit within the past 15 years. Fecal occult blood test (FOBT) of the stool. You may have this test every year starting at age 64. Flexible sigmoidoscopy or colonoscopy. You may have a sigmoidoscopy every 5 years or a colonoscopy every 10 years starting at age 47. Prostate cancer screening. Recommendations will vary depending on your family history and other risks. Hepatitis C blood test. Hepatitis B blood test. Sexually transmitted disease (STD) testing. Diabetes screening. This is done by checking your blood sugar (glucose) after you have not eaten for a while (fasting). You may have this done every 1-3 years. Discuss your test results, treatment options, and if necessary, the need for more tests with your health care provider. Vaccines  Your health care provider may recommend certain vaccines, such as: Influenza vaccine. This is recommended every year. Tetanus, diphtheria, and acellular pertussis (Tdap, Td) vaccine. You may need a Td booster every 10 years. Zoster vaccine. You may need this after age 44. Pneumococcal 13-valent conjugate (PCV13) vaccine. You may need this if you have certain conditions and have not been vaccinated. Pneumococcal polysaccharide (PPSV23) vaccine. You may need one or two doses if you smoke cigarettes or if you have certain conditions. Talk to your health care provider about which screenings and vaccines you need and how often you need them. This information is not intended to replace advice given to you by your health care provider.  Make sure you discuss any questions you  have with your health care provider. Document Released: 10/02/2015 Document Revised: 05/25/2016 Document Reviewed: 07/07/2015 Elsevier Interactive Patient Education  2017 [place] Prevention in the Home Falls can cause injuries. They can happen to people of all ages. There are many things you can do to make your home safe and to help prevent falls. What can I do on the outside of my home? Regularly fix the edges of walkways and driveways and fix any cracks. Remove anything that might make you trip as you walk through a door, such as a raised step or threshold. Trim any bushes or trees on the path to your home. Use bright outdoor lighting. Clear any walking paths of anything that might make someone trip, such as rocks or tools. Regularly check to see if handrails are loose or broken. Make sure that both sides of any steps have handrails. Any raised decks and porches should have guardrails on the edges. Have any leaves, snow, or ice cleared regularly. Use sand or salt on walking paths during winter. Clean up any spills in your garage right away. This includes oil or grease spills. What can I do in the bathroom? Use night lights. Install grab bars by the toilet and in the tub and shower. Do not use towel bars as grab bars. Use non-skid mats or decals in the tub or shower. If you need to sit down in the shower, use a plastic, non-slip stool. Keep the floor dry. Clean up any water that spills on the floor as soon as it happens. Remove soap buildup in the tub or shower regularly. Attach bath mats securely with double-sided non-slip rug tape. Do not have throw rugs and other things on the floor that can make you trip. What can I do in the bedroom? Use night lights. Make sure that you have a light by your bed that is easy to reach. Do not use any sheets or blankets that are too big for your bed. They should not hang down onto the floor. Have a firm chair that has side arms. You can  use this for support while you get dressed. Do not have throw rugs and other things on the floor that can make you trip. What can I do in the kitchen? Clean up any spills right away. Avoid walking on wet floors. Keep items that you use a lot in easy-to-reach places. If you need to reach something above you, use a strong step stool that has a grab bar. Keep electrical cords out of the way. Do not use floor polish or wax that makes floors slippery. If you must use wax, use non-skid floor wax. Do not have throw rugs and other things on the floor that can make you trip. What can I do with my stairs? Do not leave any items on the stairs. Make sure that there are handrails on both sides of the stairs and use them. Fix handrails that are broken or loose. Make sure that handrails are as long as the stairways. Check any carpeting to make sure that it is firmly attached to the stairs. Fix any carpet that is loose or worn. Avoid having throw rugs at the top or bottom of the stairs. If you do have throw rugs, attach them to the floor with carpet tape. Make sure that you have a light switch at the top of the stairs and the bottom of the stairs. If you do not have them,  ask someone to add them for you. What else can I do to help prevent falls? Wear shoes that: Do not have high heels. Have rubber bottoms. Are comfortable and fit you well. Are closed at the toe. Do not wear sandals. If you use a stepladder: Make sure that it is fully opened. Do not climb a closed stepladder. Make sure that both sides of the stepladder are locked into place. Ask someone to hold it for you, if possible. Clearly mark and make sure that you can see: Any grab bars or handrails. First and last steps. Where the edge of each step is. Use tools that help you move around (mobility aids) if they are needed. These include: Canes. Walkers. Scooters. Crutches. Turn on the lights when you go into a dark area. Replace any light  bulbs as soon as they burn out. Set up your furniture so you have a clear path. Avoid moving your furniture around. If any of your floors are uneven, fix them. If there are any pets around you, be aware of where they are. Review your medicines with your doctor. Some medicines can make you feel dizzy. This can increase your chance of falling. Ask your doctor what other things that you can do to help prevent falls. This information is not intended to replace advice given to you by your health care provider. Make sure you discuss any questions you have with your health care provider. Document Released: 07/02/2009 Document Revised: 02/11/2016 Document Reviewed: 10/10/2014 Elsevier Interactive Patient Education  2017 [employer].

## 2022-06-16 ENCOUNTER — Encounter: Payer: Self-pay | Admitting: Internal Medicine

## 2022-06-16 ENCOUNTER — Ambulatory Visit (INDEPENDENT_AMBULATORY_CARE_PROVIDER_SITE_OTHER): Payer: Medicare HMO | Admitting: Internal Medicine

## 2022-06-16 DIAGNOSIS — G8929 Other chronic pain: Secondary | ICD-10-CM

## 2022-06-16 DIAGNOSIS — K219 Gastro-esophageal reflux disease without esophagitis: Secondary | ICD-10-CM

## 2022-06-16 DIAGNOSIS — E538 Deficiency of other specified B group vitamins: Secondary | ICD-10-CM | POA: Diagnosis not present

## 2022-06-16 DIAGNOSIS — M25561 Pain in right knee: Secondary | ICD-10-CM | POA: Diagnosis not present

## 2022-06-16 DIAGNOSIS — Z23 Encounter for immunization: Secondary | ICD-10-CM

## 2022-06-16 DIAGNOSIS — D649 Anemia, unspecified: Secondary | ICD-10-CM | POA: Diagnosis not present

## 2022-06-16 DIAGNOSIS — E60 Dietary zinc deficiency: Secondary | ICD-10-CM | POA: Diagnosis not present

## 2022-06-16 DIAGNOSIS — M25562 Pain in left knee: Secondary | ICD-10-CM

## 2022-06-16 DIAGNOSIS — K31811 Angiodysplasia of stomach and duodenum with bleeding: Secondary | ICD-10-CM

## 2022-06-16 DIAGNOSIS — K7030 Alcoholic cirrhosis of liver without ascites: Secondary | ICD-10-CM

## 2022-06-16 DIAGNOSIS — G621 Alcoholic polyneuropathy: Secondary | ICD-10-CM | POA: Diagnosis not present

## 2022-06-16 NOTE — Assessment & Plan Note (Signed)
Cont on Zinc 220 mg bid

## 2022-06-16 NOTE — Assessment & Plan Note (Signed)
Not drinking 

## 2022-06-16 NOTE — Assessment & Plan Note (Addendum)
On B12/B

## 2022-06-16 NOTE — Assessment & Plan Note (Signed)
Blue-Emu cream was recommended to use 2-3 times a day ? ?

## 2022-06-16 NOTE — Assessment & Plan Note (Signed)
On a B complex po Risks associated with treatment noncompliance were discussed. Compliance was encouraged. Using a cane

## 2022-06-16 NOTE — Progress Notes (Signed)
Subjective:  Patient ID: Johnny Mcknight, male    DOB: 1956-05-18  Age: 66 y.o. MRN: [id number]  CC: Follow-up (3 MONTH F/U- Flu shot)   HPI [name] presents for anemia, liver cirrhosis, neuropathy  Pt had CBC 2 wks ago  Per Dr Malissa Hippo, [facility]: "Mr. Lohnes's prior bouts of anemia may have been due to GI bleeding but in the absence of overt melena / hematochezia / hematemesis, this is not certain. And though the small angioectasias in his small bowel are a potential source for indolent blood loss, he also could have bled from his portal gastropathy or even from his small EV's. Given the absence of any prior endoscopic studies revealing blood within the lumen of his GI tract, the absence of overt blood in his stool and the fact that he has done very well clinically since May, I do not think there is a clear role for DBE currently as I think it is unlikely to have a beneficial clinical impact at this time. Not only will the yield be relatively low, in light of his underlying cirrhosis, even if angioectasias were found and ablated, new vascular lesions would be likely to form and recurrent bleeding would be likely. I will check another CBC to insure Hgb stability. If it has dropped significantly since June, I would reconsider DBE. If it remains stable, as expected, I advise continued oral iron and periodic Hgb monitoring with consideration for DBE only if he were to develop progressive / recurrent anemia and / or overt melena".  Outpatient Medications Prior to Visit  Medication Sig Dispense Refill   B Complex-Folic Acid (B COMPLEX PLUS) TABS Take 1 tablet by mouth daily. 100 tablet 3   Cholecalciferol (VITAMIN D3) 50 MCG (2000 UT) capsule Take 1 capsule (2,000 Units total) by mouth daily. 100 capsule 3   hydrocortisone (ANUSOL-HC) 2.5 % rectal cream Place rectally 2 (two) times daily. (Patient taking differently: Place 1 application  rectally 2 (two) times daily as needed for hemorrhoids.) 30 g 0    pantoprazole (PROTONIX) 40 MG tablet Take 1 tablet (40 mg total) by mouth daily. 90 tablet 3   ferrous sulfate 325 (65 FE) MG tablet Take 1 tablet (325 mg total) by mouth 2 (two) times daily with a meal. 60 tablet 1   No facility-administered medications prior to visit.    ROS: Review of Systems  Constitutional:  Positive for unexpected weight change. Negative for appetite change and fatigue.  HENT:  Negative for congestion, nosebleeds, sneezing, sore throat and trouble swallowing.   Eyes:  Negative for itching and visual disturbance.  Respiratory:  Negative for cough.   Cardiovascular:  Negative for chest pain, palpitations and leg swelling.  Gastrointestinal:  Negative for abdominal distention, blood in stool, diarrhea and nausea.  Genitourinary:  Negative for frequency and hematuria.  Musculoskeletal:  Positive for arthralgias and gait problem. Negative for back pain, joint swelling and neck pain.  Skin:  Negative for rash.  Neurological:  Positive for weakness. Negative for dizziness, tremors and speech difficulty.  Psychiatric/Behavioral:  Negative for agitation, dysphoric mood, sleep disturbance and suicidal ideas. The patient is not nervous/anxious.     Objective:  BP 128/62 (BP Location: Left Arm)   Pulse 83   Temp 98.4 F (36.9 C) (Oral)   Ht 6' (1.829 m)   Wt 159 lb 12.8 oz (72.5 kg)   SpO2 99%   BMI 21.67 kg/m   BP Readings from Last 3 Encounters:  06/16/22 128/62  03/15/22 120/68  03/15/22 100/62    Wt Readings from Last 3 Encounters:  06/16/22 159 lb 12.8 oz (72.5 kg)  05/30/22 160 lb (72.6 kg)  03/15/22 164 lb (74.4 kg)    Physical Exam Constitutional:      General: He is not in acute distress.    Appearance: Normal appearance. He is well-developed.     Comments: NAD  Eyes:     Conjunctiva/sclera: Conjunctivae normal.     Pupils: Pupils are equal, round, and reactive to light.  Neck:     Thyroid: No thyromegaly.     Vascular: No JVD.   Cardiovascular:     Rate and Rhythm: Normal rate and regular rhythm.     Heart sounds: Normal heart sounds. No murmur heard.    No friction rub. No gallop.  Pulmonary:     Effort: Pulmonary effort is normal. No respiratory distress.     Breath sounds: Normal breath sounds. No wheezing or rales.  Chest:     Chest wall: No tenderness.  Abdominal:     General: Bowel sounds are normal. There is no distension.     Palpations: Abdomen is soft. There is no mass.     Tenderness: There is no abdominal tenderness. There is no guarding or rebound.  Musculoskeletal:        General: Tenderness present. No swelling. Normal range of motion.     Cervical back: Normal range of motion.     Right lower leg: No edema.     Left lower leg: No edema.  Lymphadenopathy:     Cervical: No cervical adenopathy.  Skin:    General: Skin is warm and dry.     Findings: No rash.  Neurological:     Mental Status: He is alert and oriented to person, place, and time.     Cranial Nerves: No cranial nerve deficit.     Motor: No abnormal muscle tone.     Coordination: Coordination normal.     Gait: Gait abnormal.     Deep Tendon Reflexes: Reflexes are normal and symmetric.  Psychiatric:        Behavior: Behavior normal.        Thought Content: Thought content normal.        Judgment: Judgment normal.   B knees are tender Using a cane  Lab Results  Component Value Date   WBC 6.8 03/14/2022   HGB 9.3 (L) 03/14/2022   HCT 27.9 (L) 03/14/2022   PLT 143.0 (L) 03/14/2022   GLUCOSE 100 (H) 03/05/2022   CHOL 157 08/20/2018   TRIG 79.0 08/20/2018   HDL 84.20 08/20/2018   LDLCALC 57 08/20/2018   ALT 21 03/04/2022   AST 68 (H) 03/04/2022   NA 138 03/05/2022   K 4.1 03/05/2022   CL 113 (H) 03/05/2022   CREATININE 1.44 (H) 03/05/2022   BUN 12 03/05/2022   CO2 20 (L) 03/05/2022   TSH 2.43 03/24/2020   PSA 1.76 08/20/2018   INR 1.2 02/16/2022   HGBA1C 4.6 04/03/2018    MR Abdomen W Wo Contrast  Result  Date: 03/27/2022 CLINICAL DATA:  Abnormal CT, cirrhosis EXAM: MRI ABDOMEN WITHOUT AND WITH CONTRAST TECHNIQUE: Multiplanar multisequence MR imaging of the abdomen was performed both before and after the administration of intravenous contrast. CONTRAST:  68m MULTIHANCE GADOBENATE DIMEGLUMINE 529 MG/ML IV SOLN COMPARISON:  CT abdomen 02/18/2022 FINDINGS: Lower chest: No acute findings. Hepatobiliary: Liver is nodular with diffuse heterogeneous parenchyma, consistent with cirrhosis. Diffusely inhomogeneous appearance  of the parenchyma on postcontrast sequences with no focal arterially enhancing/delayed washout mass identified. There is a subcentimeter cyst in the inferior right lobe segment 4/5. Gallbladder is nearly filled with small stones. No wall thickening or pericholecystic edema. No biliary ductal dilatation. Pancreas: No mass, inflammatory changes, or other parenchymal abnormality identified. Spleen:  Enlarged measuring up to 13.2 cm in length. Adrenals/Urinary Tract: Adrenal glands appear normal. Small cyst in the lower pole left kidney. No hydronephrosis. Stomach/Bowel: Colonic diverticulosis. Vascular/Lymphatic: No pathologically enlarged lymph nodes identified. 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 No abdominal aortic aneurysm demonstrated. Other:  No ascites. Musculoskeletal: No suspicious bone lesions identified. IMPRESSION: 1. Hepatic cirrhosis with no suspicious mass identified. Small hepatic cyst. 2. 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 3. Colonic diverticulosis. Electronically Signed   By: Ofilia Neas M.D.   On: 03/27/2022 18:24    Assessment & Plan:   Problem List Items Addressed This Visit     Anemia    Checked CBC 2 wks ago Per Dr Malissa Hippo, [facility]: "Mr. Perryman's prior bouts of anemia may have been due to GI bleeding but in the absence of overt melena / hematochezia / hematemesis, this is not certain. And though  the small angioectasias in his small bowel are a potential source for indolent blood loss, he also could have bled from his portal gastropathy or even from his small EV's. Given the absence of any prior endoscopic studies revealing blood within the lumen of his GI tract, the absence of overt blood in his stool and the fact that he has done very well clinically since May, I do not think there is a clear role for DBE currently as I think it is unlikely to have a beneficial clinical impact at this time. Not only will the yield be relatively low, in light of his underlying cirrhosis, even if angioectasias were found and ablated, new vascular lesions would be likely to form and recurrent bleeding would be likely. I will check another CBC to insure Hgb stability. If it has dropped significantly since June, I would reconsider DBE. If it remains stable, as expected, I advise continued oral iron and periodic Hgb monitoring with consideration for DBE only if he were to develop progressive / recurrent anemia and / or overt melena".       B12 deficiency    On B12/B      Relevant Orders   Vitamin B12   Gastric and duodenal angiodysplasia with hemorrhage    Checked CBC 2 wks ago Per Dr Malissa Hippo, [facility]: "Mr. Poust's prior bouts of anemia may have been due to GI bleeding but in the absence of overt melena / hematochezia / hematemesis, this is not certain. And though the small angioectasias in his small bowel are a potential source for indolent blood loss, he also could have bled from his portal gastropathy or even from his small EV's. Given the absence of any prior endoscopic studies revealing blood within the lumen of his GI tract, the absence of overt blood in his stool and the fact that he has done very well clinically since May, I do not think there is a clear role for DBE currently as I think it is unlikely to have a beneficial clinical impact at this time. Not only will the yield be relatively low, in light of his  underlying cirrhosis, even if angioectasias were found and ablated, new vascular lesions would be likely to form and  recurrent bleeding would be likely. I will check another CBC to insure Hgb stability. If it has dropped significantly since June, I would reconsider DBE. If it remains stable, as expected, I advise continued oral iron and periodic Hgb monitoring with consideration for DBE only if he were to develop progressive / recurrent anemia and / or overt melena".      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 Compliance was encouraged. Using a cane      Relevant Orders   Comprehensive metabolic panel   CBC with Differential/Platelet   Zinc   Protime-INR   Vitamin B12   Zinc deficiency    Cont on Zinc 220 mg bid      Relevant Orders   Zinc      No orders of the defined types were placed in this encounter.     Follow-up: Return in about 3 months (around 09/15/2022) for a follow-up visit.  Walker Kehr, MD

## 2022-06-16 NOTE — Assessment & Plan Note (Signed)
Checked CBC 2 wks ago Per Dr Malissa Hippo, [facility]: "Mr. Cragun's prior bouts of anemia may have been due to GI bleeding but in the absence of overt melena / hematochezia / hematemesis, this is not certain. And though the small angioectasias in his small bowel are a potential source for indolent blood loss, he also could have bled from his portal gastropathy or even from his small EV's. Given the absence of any prior endoscopic studies revealing blood within the lumen of his GI tract, the absence of overt blood in his stool and the fact that he has done very well clinically since May, I do not think there is a clear role for DBE currently as I think it is unlikely to have a beneficial clinical impact at this time. Not only will the yield be relatively low, in light of his underlying cirrhosis, even if angioectasias were found and ablated, new vascular lesions would be likely to form and recurrent bleeding would be likely. I will check another CBC to insure Hgb stability. If it has dropped significantly since June, I would reconsider DBE. If it remains stable, as expected, I advise continued oral iron and periodic Hgb monitoring with consideration for DBE only if he were to develop progressive / recurrent anemia and / or overt melena".

## 2022-06-16 NOTE — Assessment & Plan Note (Signed)
Cont w/Pepcid 

## 2022-06-16 NOTE — Assessment & Plan Note (Addendum)
Checked CBC 2 wks ago Per Dr Malissa Hippo, [facility]: "Mr. Ellerbe's prior bouts of anemia may have been due to GI bleeding but in the absence of overt melena / hematochezia / hematemesis, this is not certain. And though the small angioectasias in his small bowel are a potential source for indolent blood loss, he also could have bled from his portal gastropathy or even from his small EV's. Given the absence of any prior endoscopic studies revealing blood within the lumen of his GI tract, the absence of overt blood in his stool and the fact that he has done very well clinically since May, I do not think there is a clear role for DBE currently as I think it is unlikely to have a beneficial clinical impact at this time. Not only will the yield be relatively low, in light of his underlying cirrhosis, even if angioectasias were found and ablated, new vascular lesions would be likely to form and recurrent bleeding would be likely. I will check another CBC to insure Hgb stability. If it has dropped significantly since June, I would reconsider DBE. If it remains stable, as expected, I advise continued oral iron and periodic Hgb monitoring with consideration for DBE only if he were to develop progressive / recurrent anemia and / or overt melena".

## 2022-09-15 ENCOUNTER — Ambulatory Visit: Payer: Medicare HMO | Admitting: Internal Medicine

## 2022-09-21 ENCOUNTER — Ambulatory Visit (INDEPENDENT_AMBULATORY_CARE_PROVIDER_SITE_OTHER): Payer: Medicare HMO | Admitting: Internal Medicine

## 2022-09-21 ENCOUNTER — Encounter: Payer: Self-pay | Admitting: Internal Medicine

## 2022-09-21 VITALS — BP 136/84 | HR 95 | Temp 97.6°F | Ht 72.0 in | Wt 165.0 lb

## 2022-09-21 DIAGNOSIS — E519 Thiamine deficiency, unspecified: Secondary | ICD-10-CM | POA: Diagnosis not present

## 2022-09-21 DIAGNOSIS — E60 Dietary zinc deficiency: Secondary | ICD-10-CM | POA: Diagnosis not present

## 2022-09-21 DIAGNOSIS — J449 Chronic obstructive pulmonary disease, unspecified: Secondary | ICD-10-CM | POA: Diagnosis not present

## 2022-09-21 DIAGNOSIS — K219 Gastro-esophageal reflux disease without esophagitis: Secondary | ICD-10-CM

## 2022-09-21 DIAGNOSIS — R110 Nausea: Secondary | ICD-10-CM | POA: Diagnosis not present

## 2022-09-21 DIAGNOSIS — E538 Deficiency of other specified B group vitamins: Secondary | ICD-10-CM | POA: Diagnosis not present

## 2022-09-21 DIAGNOSIS — D696 Thrombocytopenia, unspecified: Secondary | ICD-10-CM | POA: Diagnosis not present

## 2022-09-21 DIAGNOSIS — K7030 Alcoholic cirrhosis of liver without ascites: Secondary | ICD-10-CM

## 2022-09-21 DIAGNOSIS — E559 Vitamin D deficiency, unspecified: Secondary | ICD-10-CM

## 2022-09-21 LAB — CBC WITH DIFFERENTIAL/PLATELET
Basophils Absolute: 0 10*3/uL (ref 0.0–0.1)
Basophils Relative: 0.8 % (ref 0.0–3.0)
Eosinophils Absolute: 0.1 10*3/uL (ref 0.0–0.7)
Eosinophils Relative: 2.9 % (ref 0.0–5.0)
HCT: 30.4 % — ABNORMAL LOW (ref 39.0–52.0)
Hemoglobin: 10.5 g/dL — ABNORMAL LOW (ref 13.0–17.0)
Lymphocytes Relative: 29.8 % (ref 12.0–46.0)
Lymphs Abs: 1.4 10*3/uL (ref 0.7–4.0)
MCHC: 34.4 g/dL (ref 30.0–36.0)
MCV: 90.7 fl (ref 78.0–100.0)
Monocytes Absolute: 0.4 10*3/uL (ref 0.1–1.0)
Monocytes Relative: 9.4 % (ref 3.0–12.0)
Neutro Abs: 2.7 10*3/uL (ref 1.4–7.7)
Neutrophils Relative %: 57.1 % (ref 43.0–77.0)
Platelets: 99 10*3/uL — ABNORMAL LOW (ref 150.0–400.0)
RBC: 3.35 Mil/uL — ABNORMAL LOW (ref 4.22–5.81)
RDW: 14.3 % (ref 11.5–15.5)
WBC: 4.7 10*3/uL (ref 4.0–10.5)

## 2022-09-21 LAB — COMPREHENSIVE METABOLIC PANEL
ALT: 14 U/L (ref 0–53)
AST: 32 U/L (ref 0–37)
Albumin: 3.8 g/dL (ref 3.5–5.2)
Alkaline Phosphatase: 142 U/L — ABNORMAL HIGH (ref 39–117)
BUN: 14 mg/dL (ref 6–23)
CO2: 23 mEq/L (ref 19–32)
Calcium: 9.3 mg/dL (ref 8.4–10.5)
Chloride: 109 mEq/L (ref 96–112)
Creatinine, Ser: 1.44 mg/dL (ref 0.40–1.50)
GFR: 51.4 mL/min — ABNORMAL LOW (ref >60.00)
Glucose, Bld: 100 mg/dL — ABNORMAL HIGH (ref 70–99)
Potassium: 3.4 mEq/L — ABNORMAL LOW (ref 3.5–5.1)
Sodium: 143 mEq/L (ref 135–145)
Total Bilirubin: 1.4 mg/dL — ABNORMAL HIGH (ref 0.2–1.2)
Total Protein: 7.7 g/dL (ref 6.0–8.3)

## 2022-09-21 MED ORDER — PROMETHAZINE HCL 25 MG PO TABS
ORAL_TABLET | ORAL | 2 refills | Status: DC
Start: 2022-09-21 — End: 2023-09-07

## 2022-09-21 MED ORDER — PANTOPRAZOLE SODIUM 40 MG PO TBEC: 40.0000 mg | DELAYED_RELEASE_TABLET | Freq: Every day | ORAL | 3 refills | Status: DC

## 2022-09-21 NOTE — Assessment & Plan Note (Signed)
Once a month beer

## 2022-09-21 NOTE — Assessment & Plan Note (Signed)
On Protonix 

## 2022-09-21 NOTE — Assessment & Plan Note (Signed)
On Vit D Risks associated with treatment noncompliance were discussed. Compliance was encouraged.

## 2022-09-21 NOTE — Assessment & Plan Note (Signed)
On B12 

## 2022-09-21 NOTE — Assessment & Plan Note (Signed)
Check CBC 

## 2022-09-21 NOTE — Assessment & Plan Note (Signed)
Alcohol related.  On B complex Risks associated with treatment noncompliance were discussed. Compliance was encouraged.

## 2022-09-21 NOTE — Progress Notes (Signed)
Subjective:  Patient ID: Johnny Mcknight, male    DOB: 1955/10/30  Age: 67 y.o. MRN: [id number]  CC: Follow-up   HPI Silvia Markuson presents for neuropathy, vit D and B12 def, nausea  Outpatient Medications Prior to Visit  Medication Sig Dispense Refill   B Complex-Folic Acid (B COMPLEX PLUS) TABS Take 1 tablet by mouth daily. 100 tablet 3   Cholecalciferol (VITAMIN D3) 50 MCG (2000 UT) capsule Take 1 capsule (2,000 Units total) by mouth daily. 100 capsule 3   hydrocortisone (ANUSOL-HC) 2.5 % rectal cream Place rectally 2 (two) times daily. (Patient taking differently: Place 1 application  rectally 2 (two) times daily as needed for hemorrhoids.) 30 g 0   pantoprazole (PROTONIX) 40 MG tablet Take 1 tablet (40 mg total) by mouth daily. 90 tablet 3   ferrous sulfate 325 (65 FE) MG tablet Take 1 tablet (325 mg total) by mouth 2 (two) times daily with a meal. 60 tablet 1   No facility-administered medications prior to visit.    ROS: Review of Systems  Constitutional:  Negative for appetite change, fatigue and unexpected weight change.  HENT:  Negative for congestion, nosebleeds, sneezing, sore throat and trouble swallowing.   Eyes:  Negative for itching and visual disturbance.  Respiratory:  Negative for cough.   Cardiovascular:  Negative for chest pain, palpitations and leg swelling.  Gastrointestinal:  Negative for abdominal distention, blood in stool, diarrhea and nausea.  Genitourinary:  Negative for frequency and hematuria.  Musculoskeletal:  Negative for back pain, gait problem, joint swelling and neck pain.  Skin:  Negative for rash.  Neurological:  Positive for numbness. Negative for dizziness, tremors, speech difficulty and weakness.  Hematological:  Does not bruise/bleed easily.  Psychiatric/Behavioral:  Negative for agitation, dysphoric mood and sleep disturbance. The patient is not nervous/anxious.     Objective:  BP 136/84 (BP Location: Right Arm, Patient Position: Sitting, Cuff  Size: Normal)   Pulse 95   Temp 97.6 F (36.4 C) (Oral)   Ht 6' (1.829 m)   Wt 165 lb (74.8 kg)   SpO2 99%   BMI 22.38 kg/m   BP Readings from Last 3 Encounters:  09/21/22 136/84  06/16/22 128/62  03/15/22 120/68    Wt Readings from Last 3 Encounters:  09/21/22 165 lb (74.8 kg)  06/16/22 159 lb 12.8 oz (72.5 kg)  05/30/22 160 lb (72.6 kg)    Physical Exam Constitutional:      General: He is not in acute distress.    Appearance: He is well-developed.     Comments: NAD  Eyes:     Conjunctiva/sclera: Conjunctivae normal.     Pupils: Pupils are equal, round, and reactive to light.  Neck:     Thyroid: No thyromegaly.     Vascular: No JVD.  Cardiovascular:     Rate and Rhythm: Normal rate and regular rhythm.     Heart sounds: Normal heart sounds. No murmur heard.    No friction rub. No gallop.  Pulmonary:     Effort: Pulmonary effort is normal. No respiratory distress.     Breath sounds: Normal breath sounds. No wheezing or rales.  Chest:     Chest wall: No tenderness.  Abdominal:     General: Bowel sounds are normal. There is no distension.     Palpations: Abdomen is soft. There is no mass.     Tenderness: There is no abdominal tenderness. There is no guarding or rebound.  Musculoskeletal:  General: No tenderness. Normal range of motion.     Cervical back: Normal range of motion.  Lymphadenopathy:     Cervical: No cervical adenopathy.  Skin:    General: Skin is warm and dry.     Findings: No rash.  Neurological:     Mental Status: He is alert and oriented to person, place, and time.     Cranial Nerves: No cranial nerve deficit.     Motor: No weakness or abnormal muscle tone.     Coordination: Coordination normal.     Gait: Gait normal.     Deep Tendon Reflexes: Reflexes are normal and symmetric.  Psychiatric:        Behavior: Behavior normal.        Thought Content: Thought content normal.        Judgment: Judgment normal.     Lab Results   Component Value Date   WBC 6.8 03/14/2022   HGB 9.3 (L) 03/14/2022   HCT 27.9 (L) 03/14/2022   PLT 143.0 (L) 03/14/2022   GLUCOSE 100 (H) 03/05/2022   CHOL 157 08/20/2018   TRIG 79.0 08/20/2018   HDL 84.20 08/20/2018   LDLCALC 57 08/20/2018   ALT 21 03/04/2022   AST 68 (H) 03/04/2022   NA 138 03/05/2022   K 4.1 03/05/2022   CL 113 (H) 03/05/2022   CREATININE 1.44 (H) 03/05/2022   BUN 12 03/05/2022   CO2 20 (L) 03/05/2022   TSH 2.43 03/24/2020   PSA 1.76 08/20/2018   INR 1.2 02/16/2022   HGBA1C 4.6 04/03/2018    MR Abdomen W Wo Contrast  Result Date: 03/27/2022 CLINICAL DATA:  Abnormal CT, cirrhosis EXAM: MRI ABDOMEN WITHOUT AND WITH CONTRAST TECHNIQUE: Multiplanar multisequence MR imaging of the abdomen was performed both before and after the administration of intravenous contrast. CONTRAST:  79m MULTIHANCE GADOBENATE DIMEGLUMINE 529 MG/ML IV SOLN COMPARISON:  CT abdomen 02/18/2022 FINDINGS: Lower chest: No acute findings. Hepatobiliary: Liver is nodular with diffuse heterogeneous parenchyma, consistent with cirrhosis. Diffusely inhomogeneous appearance of the parenchyma on postcontrast sequences with no focal arterially enhancing/delayed washout mass identified. There is a subcentimeter cyst in the inferior right lobe segment 4/5. Gallbladder is nearly filled with small stones. No wall thickening or pericholecystic edema. No biliary ductal dilatation. Pancreas: No mass, inflammatory changes, or other parenchymal abnormality identified. Spleen:  Enlarged measuring up to 13.2 cm in length. Adrenals/Urinary Tract: Adrenal glands appear normal. Small cyst in the lower pole left kidney. No hydronephrosis. Stomach/Bowel: Colonic diverticulosis. Vascular/Lymphatic: No pathologically enlarged lymph nodes identified. 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 No abdominal aortic aneurysm demonstrated. Other:  No ascites. Musculoskeletal: No suspicious bone  lesions identified. IMPRESSION: 1. Hepatic cirrhosis with no suspicious mass identified. Small hepatic cyst. 2. 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 3. Colonic diverticulosis. Electronically Signed   By: DOfilia NeasM.D.   On: 03/27/2022 18:24    Assessment & Plan:   Problem List Items Addressed This Visit     Zinc deficiency - Primary    Zinc 220 mg bid Risks associated with treatment noncompliance were discussed. Compliance was encouraged.      Vitamin D deficiency    On Vit D Risks associated with treatment noncompliance were discussed. Compliance was encouraged.      Thrombocytopenia (HTerminous    Check CBC      Relevant Orders   CBC with Differential/Platelet   Thiamine deficiency    Alcohol related.  On  B complex Risks associated with treatment noncompliance were discussed. Compliance was encouraged.      Nausea    Promethazine as needed.      Liver cirrhosis, alcoholic ([place])    Once a month beer      Relevant Orders   Comprehensive metabolic panel   CBC with Differential/Platelet   GERD    On Protonix      Relevant Medications   pantoprazole (PROTONIX) 40 MG tablet   B12 deficiency    On B12         Meds ordered this encounter  Medications   pantoprazole (PROTONIX) 40 MG tablet    Sig: Take 1 tablet (40 mg total) by mouth daily.    Dispense:  90 tablet    Refill:  3   promethazine (PHENERGAN) 25 MG tablet    Sig: TAKE 1 TABLET BY MOUTH EVERY 8 HOURS AS NEEDED FOR NAUSEA FOR VOMITING OR  USE  FOR  HICCUPS    Dispense:  60 tablet    Refill:  2      Follow-up: Return in about 4 months (around 01/20/2023) for a follow-up visit.  Walker Kehr, MD

## 2022-09-21 NOTE — Assessment & Plan Note (Signed)
Zinc 220 mg bid Risks associated with treatment noncompliance were discussed. Compliance was encouraged.

## 2022-09-21 NOTE — Assessment & Plan Note (Signed)
Promethazine as needed.

## 2023-01-11 ENCOUNTER — Ambulatory Visit (INDEPENDENT_AMBULATORY_CARE_PROVIDER_SITE_OTHER): Payer: Medicare HMO | Admitting: Internal Medicine

## 2023-01-11 ENCOUNTER — Encounter: Payer: Self-pay | Admitting: Internal Medicine

## 2023-01-11 VITALS — BP 134/82 | HR 89 | Temp 98.1°F | Ht 72.0 in | Wt 168.0 lb

## 2023-01-11 DIAGNOSIS — K219 Gastro-esophageal reflux disease without esophagitis: Secondary | ICD-10-CM

## 2023-01-11 DIAGNOSIS — D696 Thrombocytopenia, unspecified: Secondary | ICD-10-CM | POA: Diagnosis not present

## 2023-01-11 DIAGNOSIS — E60 Dietary zinc deficiency: Secondary | ICD-10-CM

## 2023-01-11 DIAGNOSIS — D539 Nutritional anemia, unspecified: Secondary | ICD-10-CM | POA: Diagnosis not present

## 2023-01-11 DIAGNOSIS — K7030 Alcoholic cirrhosis of liver without ascites: Secondary | ICD-10-CM | POA: Diagnosis not present

## 2023-01-11 DIAGNOSIS — E519 Thiamine deficiency, unspecified: Secondary | ICD-10-CM | POA: Diagnosis not present

## 2023-01-11 DIAGNOSIS — I10 Essential (primary) hypertension: Secondary | ICD-10-CM

## 2023-01-11 DIAGNOSIS — E876 Hypokalemia: Secondary | ICD-10-CM

## 2023-01-11 DIAGNOSIS — D500 Iron deficiency anemia secondary to blood loss (chronic): Secondary | ICD-10-CM | POA: Diagnosis not present

## 2023-01-11 DIAGNOSIS — L853 Xerosis cutis: Secondary | ICD-10-CM

## 2023-01-11 DIAGNOSIS — E538 Deficiency of other specified B group vitamins: Secondary | ICD-10-CM | POA: Diagnosis not present

## 2023-01-11 LAB — CBC WITH DIFFERENTIAL/PLATELET
Basophils Absolute: 0 10*3/uL (ref 0.0–0.1)
Basophils Relative: 0.5 % (ref 0.0–3.0)
Eosinophils Absolute: 0.1 10*3/uL (ref 0.0–0.7)
Eosinophils Relative: 2 % (ref 0.0–5.0)
HCT: 31.2 % — ABNORMAL LOW (ref 39.0–52.0)
Hemoglobin: 10.7 g/dL — ABNORMAL LOW (ref 13.0–17.0)
Lymphocytes Relative: 27.6 % (ref 12.0–46.0)
Lymphs Abs: 1.2 10*3/uL (ref 0.7–4.0)
MCHC: 34.4 g/dL (ref 30.0–36.0)
MCV: 90.4 fl (ref 78.0–100.0)
Monocytes Absolute: 0.3 10*3/uL (ref 0.1–1.0)
Monocytes Relative: 7.8 % (ref 3.0–12.0)
Neutro Abs: 2.8 10*3/uL (ref 1.4–7.7)
Neutrophils Relative %: 62.1 % (ref 43.0–77.0)
Platelets: 95 10*3/uL — ABNORMAL LOW (ref 150.0–400.0)
RBC: 3.45 Mil/uL — ABNORMAL LOW (ref 4.22–5.81)
RDW: 14.6 % (ref 11.5–15.5)
WBC: 4.5 10*3/uL (ref 4.0–10.5)

## 2023-01-11 LAB — COMPREHENSIVE METABOLIC PANEL
ALT: 17 U/L (ref 0–53)
AST: 33 U/L (ref 0–37)
Albumin: 3.8 g/dL (ref 3.5–5.2)
Alkaline Phosphatase: 148 U/L — ABNORMAL HIGH (ref 39–117)
BUN: 15 mg/dL (ref 6–23)
CO2: 25 mEq/L (ref 19–32)
Calcium: 9.3 mg/dL (ref 8.4–10.5)
Chloride: 110 mEq/L (ref 96–112)
Creatinine, Ser: 1.58 mg/dL — ABNORMAL HIGH (ref 0.40–1.50)
GFR: 45.88 mL/min — ABNORMAL LOW (ref >60.00)
Glucose, Bld: 93 mg/dL (ref 70–99)
Potassium: 3.9 mEq/L (ref 3.5–5.1)
Sodium: 144 mEq/L (ref 135–145)
Total Bilirubin: 1.4 mg/dL — ABNORMAL HIGH (ref 0.2–1.2)
Total Protein: 7.8 g/dL (ref 6.0–8.3)

## 2023-01-11 LAB — VITAMIN D 25 HYDROXY (VIT D DEFICIENCY, FRACTURES): VITD: 57.06 ng/mL (ref 30.00–100.00)

## 2023-01-11 LAB — VITAMIN B12: Vitamin B-12: 891 pg/mL (ref 211–911)

## 2023-01-11 MED ORDER — PANTOPRAZOLE SODIUM 40 MG PO TBEC: 40.0000 mg | DELAYED_RELEASE_TABLET | Freq: Every day | ORAL | 3 refills | Status: DC

## 2023-01-11 NOTE — Assessment & Plan Note (Signed)
On a B omplex

## 2023-01-11 NOTE — Assessment & Plan Note (Signed)
Monitor Zinc level

## 2023-01-11 NOTE — Assessment & Plan Note (Signed)
Re-start Vit B12 Check B12  level

## 2023-01-11 NOTE — Assessment & Plan Note (Signed)
Check Zinc level

## 2023-01-11 NOTE — Assessment & Plan Note (Signed)
Due to the liver disease Check CBC

## 2023-01-11 NOTE — Assessment & Plan Note (Signed)
CBC

## 2023-01-11 NOTE — Assessment & Plan Note (Signed)
Due to the liver disease, poor nutrition Check CBC

## 2023-01-11 NOTE — Progress Notes (Signed)
Subjective:  Patient ID: Johnny Mcknight, male    DOB: 1956/06/06  Age: 67 y.o. MRN: [id number]  CC: No chief complaint on file.   HPI Jumar Greenstreet presents for neuropathy, B12 and Zink deficiency, low PLT count C/o GERD   Outpatient Medications Prior to Visit  Medication Sig Dispense Refill   B Complex-Folic Acid (B COMPLEX PLUS) TABS Take 1 tablet by mouth daily. 100 tablet 3   Cholecalciferol (VITAMIN D3) 50 MCG (2000 UT) capsule Take 1 capsule (2,000 Units total) by mouth daily. 100 capsule 3   hydrocortisone (ANUSOL-HC) 2.5 % rectal cream Place rectally 2 (two) times daily. (Patient taking differently: Place 1 application  rectally 2 (two) times daily as needed for hemorrhoids.) 30 g 0   promethazine (PHENERGAN) 25 MG tablet TAKE 1 TABLET BY MOUTH EVERY 8 HOURS AS NEEDED FOR NAUSEA FOR VOMITING OR  USE  FOR  HICCUPS 60 tablet 2   pantoprazole (PROTONIX) 40 MG tablet Take 1 tablet (40 mg total) by mouth daily. 90 tablet 3   No facility-administered medications prior to visit.    ROS: Review of Systems  Constitutional:  Negative for appetite change, fatigue and unexpected weight change.  HENT:  Negative for congestion, nosebleeds, sneezing, sore throat and trouble swallowing.   Eyes:  Negative for itching and visual disturbance.  Respiratory:  Negative for cough.   Cardiovascular:  Negative for chest pain, palpitations and leg swelling.  Gastrointestinal:  Negative for abdominal distention, blood in stool, diarrhea and nausea.  Genitourinary:  Negative for frequency and hematuria.  Musculoskeletal:  Positive for gait problem. Negative for back pain, joint swelling and neck pain.  Skin:  Negative for rash.  Neurological:  Positive for numbness. Negative for dizziness, tremors, speech difficulty and weakness.  Psychiatric/Behavioral:  Negative for agitation, confusion, dysphoric mood, sleep disturbance and suicidal ideas. The patient is not nervous/anxious.     Objective:  BP 134/82  (BP Location: Left Arm, Patient Position: Sitting, Cuff Size: Normal)   Pulse 89   Temp 98.1 F (36.7 C) (Oral)   Ht 6' (1.829 m)   Wt 168 lb (76.2 kg)   SpO2 100%   BMI 22.78 kg/m   BP Readings from Last 3 Encounters:  01/11/23 134/82  09/21/22 136/84  06/16/22 128/62    Wt Readings from Last 3 Encounters:  01/11/23 168 lb (76.2 kg)  09/21/22 165 lb (74.8 kg)  06/16/22 159 lb 12.8 oz (72.5 kg)    Physical Exam Constitutional:      General: He is not in acute distress.    Appearance: Normal appearance. He is well-developed.     Comments: NAD  Eyes:     Conjunctiva/sclera: Conjunctivae normal.     Pupils: Pupils are equal, round, and reactive to light.  Neck:     Thyroid: No thyromegaly.     Vascular: No JVD.  Cardiovascular:     Rate and Rhythm: Normal rate and regular rhythm.     Heart sounds: Normal heart sounds. No murmur heard.    No friction rub. No gallop.  Pulmonary:     Effort: Pulmonary effort is normal. No respiratory distress.     Breath sounds: Normal breath sounds. No wheezing or rales.  Chest:     Chest wall: No tenderness.  Abdominal:     General: Bowel sounds are normal. There is no distension.     Palpations: Abdomen is soft. There is no mass.     Tenderness: There is no abdominal tenderness.  There is no guarding or rebound.  Musculoskeletal:        General: No tenderness. Normal range of motion.     Cervical back: Normal range of motion.  Lymphadenopathy:     Cervical: No cervical adenopathy.  Skin:    General: Skin is warm and dry.     Findings: No rash.  Neurological:     Mental Status: He is alert and oriented to person, place, and time.     Cranial Nerves: No cranial nerve deficit.     Motor: No abnormal muscle tone.     Coordination: Coordination normal.     Gait: Gait normal.     Deep Tendon Reflexes: Reflexes are normal and symmetric.  Psychiatric:        Behavior: Behavior normal.        Thought Content: Thought content normal.         Judgment: Judgment normal.   Slight ataxia  Lab Results  Component Value Date   WBC 4.7 09/21/2022   HGB 10.5 (L) 09/21/2022   HCT 30.4 (L) 09/21/2022   PLT 99.0 (L) 09/21/2022   GLUCOSE 100 (H) 09/21/2022   CHOL 157 08/20/2018   TRIG 79.0 08/20/2018   HDL 84.20 08/20/2018   LDLCALC 57 08/20/2018   ALT 14 09/21/2022   AST 32 09/21/2022   NA 143 09/21/2022   K 3.4 (L) 09/21/2022   CL 109 09/21/2022   CREATININE 1.44 09/21/2022   BUN 14 09/21/2022   CO2 23 09/21/2022   TSH 2.43 03/24/2020   PSA 1.76 08/20/2018   INR 1.2 02/16/2022   HGBA1C 4.6 04/03/2018    MR Abdomen W Wo Contrast  Result Date: 03/27/2022 CLINICAL DATA:  Abnormal CT, cirrhosis EXAM: MRI ABDOMEN WITHOUT AND WITH CONTRAST TECHNIQUE: Multiplanar multisequence MR imaging of the abdomen was performed both before and after the administration of intravenous contrast. CONTRAST:  14mL MULTIHANCE GADOBENATE DIMEGLUMINE 529 MG/ML IV SOLN COMPARISON:  CT abdomen 02/18/2022 FINDINGS: Lower chest: No acute findings. Hepatobiliary: Liver is nodular with diffuse heterogeneous parenchyma, consistent with cirrhosis. Diffusely inhomogeneous appearance of the parenchyma on postcontrast sequences with no focal arterially enhancing/delayed washout mass identified. There is a subcentimeter cyst in the inferior right lobe segment 4/5. Gallbladder is nearly filled with small stones. No wall thickening or pericholecystic edema. No biliary ductal dilatation. Pancreas: No mass, inflammatory changes, or other parenchymal abnormality identified. Spleen:  Enlarged measuring up to 13.2 cm in length. Adrenals/Urinary Tract: Adrenal glands appear normal. Small cyst in the lower pole left kidney. No hydronephrosis. Stomach/Bowel: Colonic diverticulosis. Vascular/Lymphatic: No pathologically enlarged lymph nodes identified. 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 No abdominal aortic aneurysm demonstrated.  Other:  No ascites. Musculoskeletal: No suspicious bone lesions identified. IMPRESSION: 1. Hepatic cirrhosis with no suspicious mass identified. Small hepatic cyst. 2. 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 3. Colonic diverticulosis. Electronically Signed   By: Jannifer Hick M.D.   On: 03/27/2022 18:24    Assessment & Plan:   Problem List Items Addressed This Visit       Cardiovascular and Mediastinum   HTN (hypertension) - Primary    On NAS diet        Digestive   GERD    Re-start on Protonix      Relevant Medications   pantoprazole (PROTONIX) 40 MG tablet   Liver cirrhosis, alcoholic    Monitor AFP, [place]      Relevant Orders   CBC  with Differential/Platelet   Comprehensive metabolic panel   Vitamin B12   VITAMIN D 25 Hydroxy (Vit-D Deficiency, Fractures)   Zinc   AFP tumor marker   US Abdomen Limited RUQ (LIVER/GB)     Musculoskeletal and Integument   Dry skin    Check Zinc level      Relevant Orders   CBC with Differential/Platelet   Comprehensive metabolic panel   Vitamin B12   VITAMIN D 25 Hydroxy (Vit-D Deficiency, Fractures)   Zinc   AFP tumor marker     Hematopoietic and Hemostatic   Thrombocytopenia    Due to the liver disease Check CBC      Relevant Orders   CBC with Differential/Platelet   Comprehensive metabolic panel   Vitamin B12   VITAMIN D 25 Hydroxy (Vit-D Deficiency, Fractures)   Zinc   AFP tumor marker     Other   Anemia associated with nutritional deficiency    Due to the liver disease, poor nutrition Check CBC      B12 deficiency    Re-start Vit B12 Check B12  level      Relevant Orders   Vitamin B12   Hypokalemia    Re-check CMET      Iron deficiency anemia due to chronic blood loss    CBC      Thiamine deficiency    On a B omplex      Zinc deficiency    Monitor Zinc level         Meds ordered this encounter  Medications   pantoprazole (PROTONIX) 40 MG tablet    Sig:  Take 1 tablet (40 mg total) by mouth daily.    Dispense:  90 tablet    Refill:  3      Follow-up: Return in about 3 months (around 04/12/2023) for a follow-up visit.  Sonda Primes, MD

## 2023-01-11 NOTE — Assessment & Plan Note (Signed)
Monitor AFP, [place]

## 2023-01-11 NOTE — Assessment & Plan Note (Signed)
Re-start on Protonix

## 2023-01-11 NOTE — Assessment & Plan Note (Signed)
On NAS diet  

## 2023-01-11 NOTE — Assessment & Plan Note (Signed)
Recheck CMET 

## 2023-01-18 LAB — AFP TUMOR MARKER: AFP-Tumor Marker: 2.1 ng/mL (ref <6.1)

## 2023-01-18 LAB — ZINC: Zinc: 54 ug/dL — ABNORMAL LOW (ref 60–130)

## 2023-01-24 ENCOUNTER — Ambulatory Visit: Payer: Medicare HMO | Admitting: Internal Medicine

## 2023-03-16 ENCOUNTER — Telehealth: Payer: Self-pay | Admitting: Internal Medicine

## 2023-03-16 NOTE — Telephone Encounter (Signed)
Rec'd disability plate form place in MD purple folder to complete.Marland KitchenRaechel Mcknight

## 2023-03-16 NOTE — Telephone Encounter (Signed)
Patient dropped off paperwork for Disability License Plate - placed in provider's box for completion. Please call patient at [phone] when ready to pick up.

## 2023-03-20 NOTE — Telephone Encounter (Signed)
Notified pt form ready for pick-up../lmb 

## 2023-03-21 NOTE — Telephone Encounter (Signed)
Okay.  Thanks.

## 2023-04-03 NOTE — Telephone Encounter (Signed)
Noted../lmb 

## 2023-04-03 NOTE — Telephone Encounter (Signed)
Patient has picked up paperwork.

## 2023-04-12 ENCOUNTER — Encounter: Payer: Self-pay | Admitting: Internal Medicine

## 2023-04-12 ENCOUNTER — Ambulatory Visit (INDEPENDENT_AMBULATORY_CARE_PROVIDER_SITE_OTHER): Payer: Medicare HMO | Admitting: Internal Medicine

## 2023-04-12 VITALS — BP 114/68 | HR 83 | Temp 98.2°F | Ht 72.0 in | Wt 168.0 lb

## 2023-04-12 DIAGNOSIS — D649 Anemia, unspecified: Secondary | ICD-10-CM | POA: Diagnosis not present

## 2023-04-12 DIAGNOSIS — E538 Deficiency of other specified B group vitamins: Secondary | ICD-10-CM

## 2023-04-12 DIAGNOSIS — K31811 Angiodysplasia of stomach and duodenum with bleeding: Secondary | ICD-10-CM | POA: Diagnosis not present

## 2023-04-12 DIAGNOSIS — R21 Rash and other nonspecific skin eruption: Secondary | ICD-10-CM | POA: Diagnosis not present

## 2023-04-12 LAB — CBC WITH DIFFERENTIAL/PLATELET
Basophils Absolute: 0 10*3/uL (ref 0.0–0.1)
Basophils Relative: 0.4 % (ref 0.0–3.0)
Eosinophils Absolute: 0.2 10*3/uL (ref 0.0–0.7)
Eosinophils Relative: 4.1 % (ref 0.0–5.0)
HCT: 25.1 % — ABNORMAL LOW (ref 39.0–52.0)
Hemoglobin: 8.3 g/dL — ABNORMAL LOW (ref 13.0–17.0)
Lymphocytes Relative: 29.8 % (ref 12.0–46.0)
Lymphs Abs: 1.1 10*3/uL (ref 0.7–4.0)
MCHC: 32.9 g/dL (ref 30.0–36.0)
MCV: 91.3 fl (ref 78.0–100.0)
Monocytes Absolute: 0.4 10*3/uL (ref 0.1–1.0)
Monocytes Relative: 10.2 % (ref 3.0–12.0)
Neutro Abs: 2.1 10*3/uL (ref 1.4–7.7)
Neutrophils Relative %: 55.5 % (ref 43.0–77.0)
Platelets: 126 10*3/uL — ABNORMAL LOW (ref 150.0–400.0)
RBC: 2.75 Mil/uL — ABNORMAL LOW (ref 4.22–5.81)
RDW: 14.5 % (ref 11.5–15.5)
WBC: 3.8 10*3/uL — ABNORMAL LOW (ref 4.0–10.5)

## 2023-04-12 LAB — COMPREHENSIVE METABOLIC PANEL
ALT: 14 U/L (ref 0–53)
AST: 27 U/L (ref 0–37)
Albumin: 3.8 g/dL (ref 3.5–5.2)
Alkaline Phosphatase: 142 U/L — ABNORMAL HIGH (ref 39–117)
BUN: 20 mg/dL (ref 6–23)
CO2: 22 mEq/L (ref 19–32)
Calcium: 9.2 mg/dL (ref 8.4–10.5)
Chloride: 111 mEq/L (ref 96–112)
Creatinine, Ser: 1.66 mg/dL — ABNORMAL HIGH (ref 0.40–1.50)
GFR: 43.17 mL/min — ABNORMAL LOW (ref >60.00)
Glucose, Bld: 101 mg/dL — ABNORMAL HIGH (ref 70–99)
Potassium: 3.9 mEq/L (ref 3.5–5.1)
Sodium: 141 mEq/L (ref 135–145)
Total Bilirubin: 1.1 mg/dL (ref 0.2–1.2)
Total Protein: 7.5 g/dL (ref 6.0–8.3)

## 2023-04-12 MED ORDER — ZINC 100 MG PO TABS: ORAL_TABLET | ORAL | 3 refills | Status: DC

## 2023-04-12 MED ORDER — TRIAMCINOLONE ACETONIDE 0.5 % EX CREA: 1.0000 | TOPICAL_CREAM | Freq: Three times a day (TID) | CUTANEOUS | 3 refills | Status: DC

## 2023-04-12 NOTE — Assessment & Plan Note (Addendum)
Check CBC, iron  Recurrent - h/o AVMs Last EGD 2023 Restart PPI GI f/u

## 2023-04-12 NOTE — Assessment & Plan Note (Signed)
On B12 

## 2023-04-12 NOTE — Assessment & Plan Note (Addendum)
Recurrent - h/o AVMs Last EGD 2023 Restart PPI GI f/u

## 2023-04-12 NOTE — Progress Notes (Signed)
Subjective:  Patient ID: Johnny Mcknight, male    DOB: Sep 02, 1956  Age: 67 y.o. MRN: [id number]  CC: Follow-up (3 mnth f/u)   HPI Johnny Mcknight presents for a 3 mo f/u C/o toe infection - rash on the L foot x 1 wk, itchy C/o black stools x 3  Outpatient Medications Prior to Visit  Medication Sig Dispense Refill   B Complex-Folic Acid (B COMPLEX PLUS) TABS Take 1 tablet by mouth daily. 100 tablet 3   Cholecalciferol (VITAMIN D3) 50 MCG (2000 UT) capsule Take 1 capsule (2,000 Units total) by mouth daily. 100 capsule 3   hydrocortisone (ANUSOL-HC) 2.5 % rectal cream Place rectally 2 (two) times daily. (Patient taking differently: Place 1 application  rectally 2 (two) times daily as needed for hemorrhoids.) 30 g 0   pantoprazole (PROTONIX) 40 MG tablet Take 1 tablet (40 mg total) by mouth daily. 90 tablet 3   promethazine (PHENERGAN) 25 MG tablet TAKE 1 TABLET BY MOUTH EVERY 8 HOURS AS NEEDED FOR NAUSEA FOR VOMITING OR  USE  FOR  HICCUPS 60 tablet 2   No facility-administered medications prior to visit.    ROS: Review of Systems  Constitutional:  Negative for appetite change, fatigue and unexpected weight change.  HENT:  Negative for congestion, nosebleeds, sneezing, sore throat and trouble swallowing.   Eyes:  Negative for itching and visual disturbance.  Respiratory:  Negative for cough.   Cardiovascular:  Negative for chest pain, palpitations and leg swelling.  Gastrointestinal:  Negative for abdominal distention, blood in stool, diarrhea and nausea.  Genitourinary:  Negative for frequency and hematuria.  Musculoskeletal:  Negative for back pain, gait problem, joint swelling and neck pain.  Skin:  Negative for rash.  Neurological:  Negative for dizziness, tremors, speech difficulty and weakness.  Psychiatric/Behavioral:  Negative for agitation, dysphoric mood, sleep disturbance and suicidal ideas. The patient is not nervous/anxious.     Objective:  BP 114/68 (BP Location: Left Arm,  Patient Position: Sitting, Cuff Size: Normal)   Pulse 83   Temp 98.2 F (36.8 C) (Oral)   Ht 6' (1.829 m)   Wt 168 lb (76.2 kg)   SpO2 99%   BMI 22.78 kg/m   BP Readings from Last 3 Encounters:  04/12/23 114/68  01/11/23 134/82  09/21/22 136/84    Wt Readings from Last 3 Encounters:  04/12/23 168 lb (76.2 kg)  01/11/23 168 lb (76.2 kg)  09/21/22 165 lb (74.8 kg)    Physical Exam Constitutional:      General: He is not in acute distress.    Appearance: He is well-developed.     Comments: NAD  Eyes:     Conjunctiva/sclera: Conjunctivae normal.     Pupils: Pupils are equal, round, and reactive to light.  Neck:     Thyroid: No thyromegaly.     Vascular: No JVD.  Cardiovascular:     Rate and Rhythm: Normal rate and regular rhythm.     Heart sounds: Normal heart sounds. No murmur heard.    No friction rub. No gallop.  Pulmonary:     Effort: Pulmonary effort is normal. No respiratory distress.     Breath sounds: Normal breath sounds. No wheezing or rales.  Chest:     Chest wall: No tenderness.  Abdominal:     General: Bowel sounds are normal. There is no distension.     Palpations: Abdomen is soft. There is no mass.     Tenderness: There is no abdominal tenderness.  There is no guarding or rebound.  Musculoskeletal:        General: No tenderness. Normal range of motion.     Cervical back: Normal range of motion.  Lymphadenopathy:     Cervical: No cervical adenopathy.  Skin:    General: Skin is warm and dry.     Findings: Lesion present. No rash.  Neurological:     Mental Status: He is alert and oriented to person, place, and time.     Cranial Nerves: No cranial nerve deficit.     Motor: No abnormal muscle tone.     Coordination: Coordination normal.     Gait: Gait abnormal.     Deep Tendon Reflexes: Reflexes are normal and symmetric.  Psychiatric:        Behavior: Behavior normal.        Thought Content: Thought content normal.        Judgment: Judgment normal.    Slight ataxia Rash on L foot - exematous   Lab Results  Component Value Date   WBC 4.5 01/11/2023   HGB 10.7 (L) 01/11/2023   HCT 31.2 (L) 01/11/2023   PLT 95.0 (L) 01/11/2023   GLUCOSE 93 01/11/2023   CHOL 157 08/20/2018   TRIG 79.0 08/20/2018   HDL 84.20 08/20/2018   LDLCALC 57 08/20/2018   ALT 17 01/11/2023   AST 33 01/11/2023   NA 144 01/11/2023   K 3.9 01/11/2023   CL 110 01/11/2023   CREATININE 1.58 (H) 01/11/2023   BUN 15 01/11/2023   CO2 25 01/11/2023   TSH 2.43 03/24/2020   PSA 1.76 08/20/2018   INR 1.2 02/16/2022   HGBA1C 4.6 04/03/2018    MR Abdomen W Wo Contrast  Result Date: 03/27/2022 CLINICAL DATA:  Abnormal CT, cirrhosis EXAM: MRI ABDOMEN WITHOUT AND WITH CONTRAST TECHNIQUE: Multiplanar multisequence MR imaging of the abdomen was performed both before and after the administration of intravenous contrast. CONTRAST:  14mL MULTIHANCE GADOBENATE DIMEGLUMINE 529 MG/ML IV SOLN COMPARISON:  CT abdomen 02/18/2022 FINDINGS: Lower chest: No acute findings. Hepatobiliary: Liver is nodular with diffuse heterogeneous parenchyma, consistent with cirrhosis. Diffusely inhomogeneous appearance of the parenchyma on postcontrast sequences with no focal arterially enhancing/delayed washout mass identified. There is a subcentimeter cyst in the inferior right lobe segment 4/5. Gallbladder is nearly filled with small stones. No wall thickening or pericholecystic edema. No biliary ductal dilatation. Pancreas: No mass, inflammatory changes, or other parenchymal abnormality identified. Spleen:  Enlarged measuring up to 13.2 cm in length. Adrenals/Urinary Tract: Adrenal glands appear normal. Small cyst in the lower pole left kidney. No hydronephrosis. Stomach/Bowel: Colonic diverticulosis. Vascular/Lymphatic: No pathologically enlarged lymph nodes identified. 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 No abdominal aortic aneurysm demonstrated. Other:  No  ascites. Musculoskeletal: No suspicious bone lesions identified. IMPRESSION: 1. Hepatic cirrhosis with no suspicious mass identified. Small hepatic cyst. 2. 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 3. Colonic diverticulosis. Electronically Signed   By: Jannifer Hick M.D.   On: 03/27/2022 18:24    Assessment & Plan:   Problem List Items Addressed This Visit     B12 deficiency    On B12      Rash    Triamc cream  Re-start Zinc, B complex      Anemia    Check CBC, iron  Recurrent - h/o AVMs Last EGD 2023 Restart PPI GI f/u      Relevant Orders   CBC with Differential/Platelet  Iron, TIBC and Ferritin Panel   Comprehensive metabolic panel   GI bleed - Primary    Recurrent - h/o AVMs Last EGD 2023 Restart PPI GI f/u      Relevant Orders   CBC with Differential/Platelet   Iron, TIBC and Ferritin Panel   Comprehensive metabolic panel      Meds ordered this encounter  Medications   Zinc 100 MG TABS    Sig: 1 po qd    Dispense:  100 tablet    Refill:  3   triamcinolone cream (KENALOG) 0.5 %    Sig: Apply 1 Application topically 3 (three) times daily.    Dispense:  90 g    Refill:  3      Follow-up: Return in about 6 weeks (around 05/24/2023) for a follow-up visit.  Sonda Primes, MD

## 2023-04-12 NOTE — Assessment & Plan Note (Signed)
Triamc cream  Re-start Zinc, B complex

## 2023-04-13 ENCOUNTER — Other Ambulatory Visit: Payer: Self-pay | Admitting: Internal Medicine

## 2023-04-13 LAB — IRON,TIBC AND FERRITIN PANEL
%SAT: 7 % (calc) — ABNORMAL LOW (ref 20–48)
Iron: 25 ug/dL — ABNORMAL LOW (ref 50–180)
TIBC: 362 mcg/dL (calc) (ref 250–425)

## 2023-04-13 MED ORDER — IRON (FERROUS SULFATE) 325 (65 FE) MG PO TABS: 325.0000 mg | ORAL_TABLET | Freq: Every day | ORAL | 5 refills | Status: DC

## 2023-05-04 ENCOUNTER — Encounter (INDEPENDENT_AMBULATORY_CARE_PROVIDER_SITE_OTHER): Payer: Self-pay

## 2023-05-25 ENCOUNTER — Ambulatory Visit: Payer: Medicare HMO | Admitting: Internal Medicine

## 2023-05-26 DIAGNOSIS — H5203 Hypermetropia, bilateral: Secondary | ICD-10-CM | POA: Diagnosis not present

## 2023-05-26 DIAGNOSIS — H524 Presbyopia: Secondary | ICD-10-CM | POA: Diagnosis not present

## 2023-06-04 IMAGING — CT CT CERVICAL SPINE W/O CM
3 of 5 series · 10 of 33 positions shown, 12 images · non-contrast
Comparison: CT head and cervical spine 09/06/2009

CLINICAL DATA: Syncope loss of consciousness



[Series 8: orthogonal bone lower · axial · 0.23mm/px · z∈[+573,+650]mm · 2 of 119 slices shown, 3 images]
[im 48/119  soft-tissue]
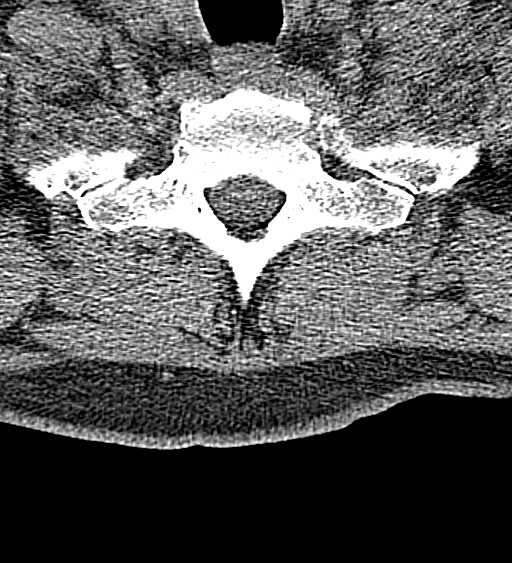
[im 48/119  bone]
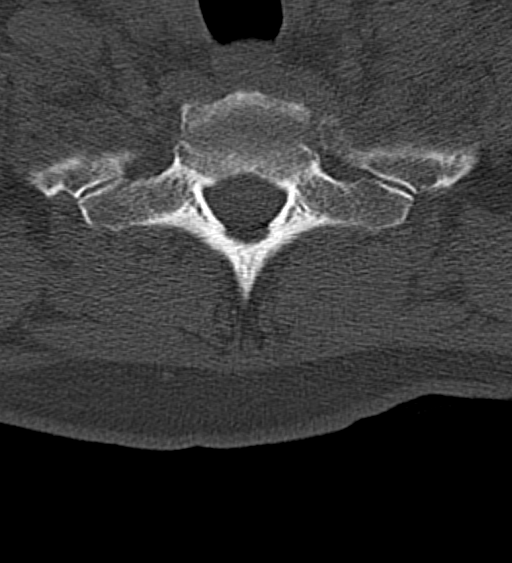
[im 95/119  bone]
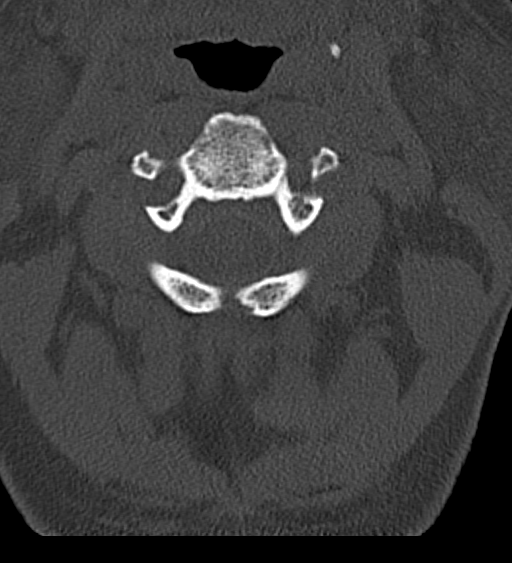

[Series 9: coronal bone · coronal · 0.26mm/px · 3 of 67 slices shown]
[im 21/67  bone]
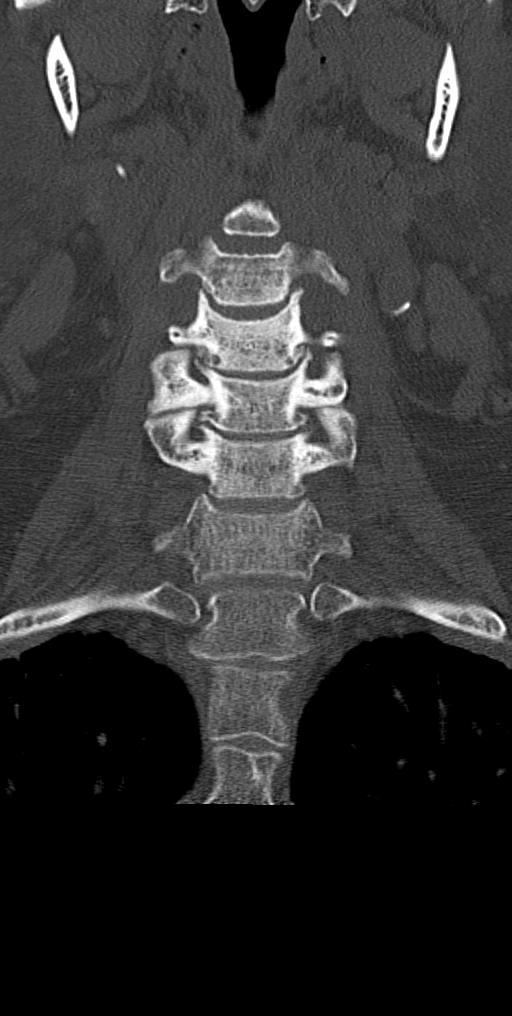
[im 29/67  bone]
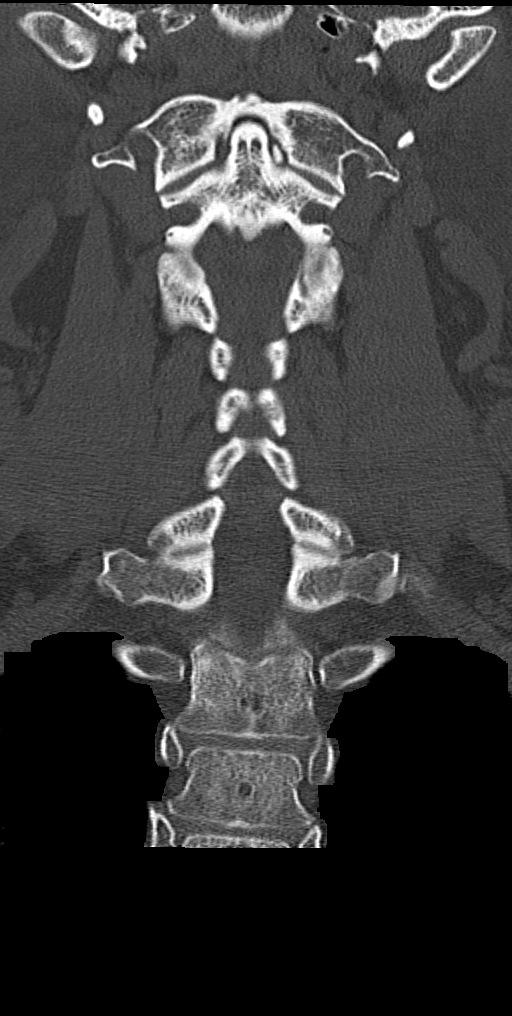
[im 38/67  bone]
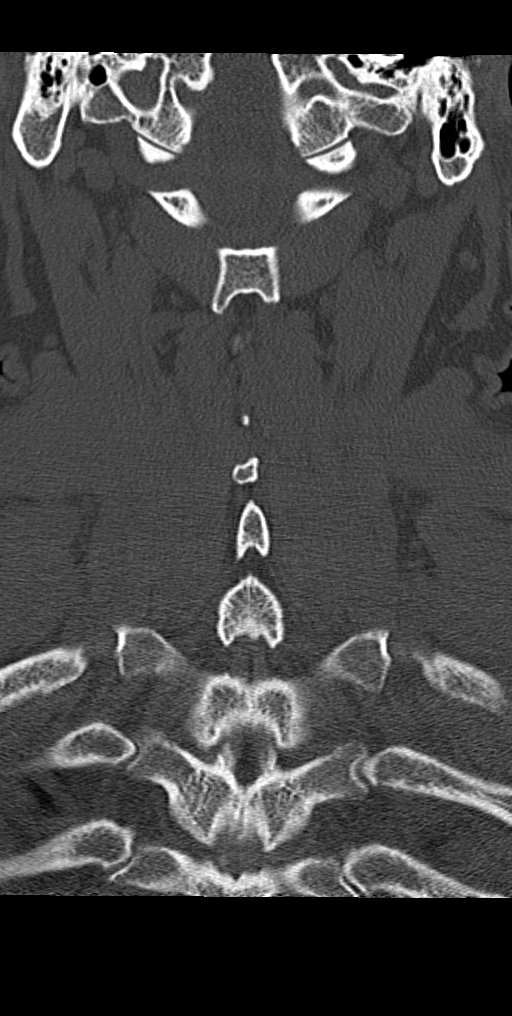

[Series 10: sagittal bone · sagittal · 0.31mm/px · 5 of 61 slices shown, 6 images]
[im 21/61  bone]
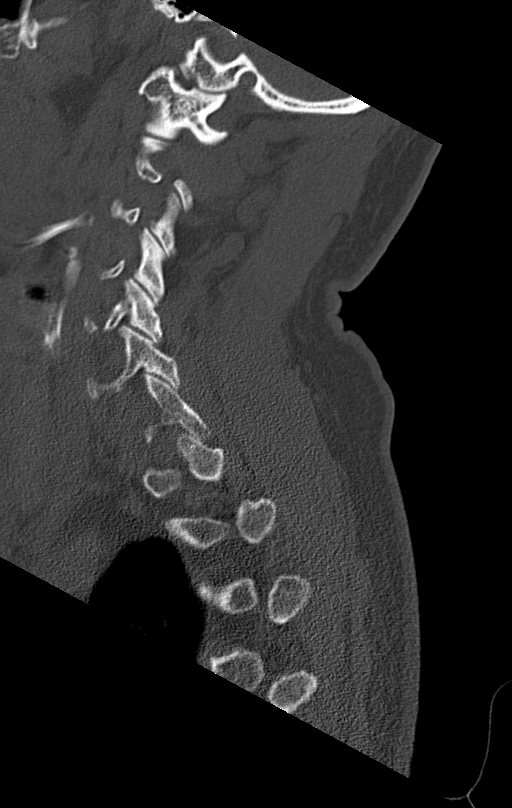
[im 26/61  bone]
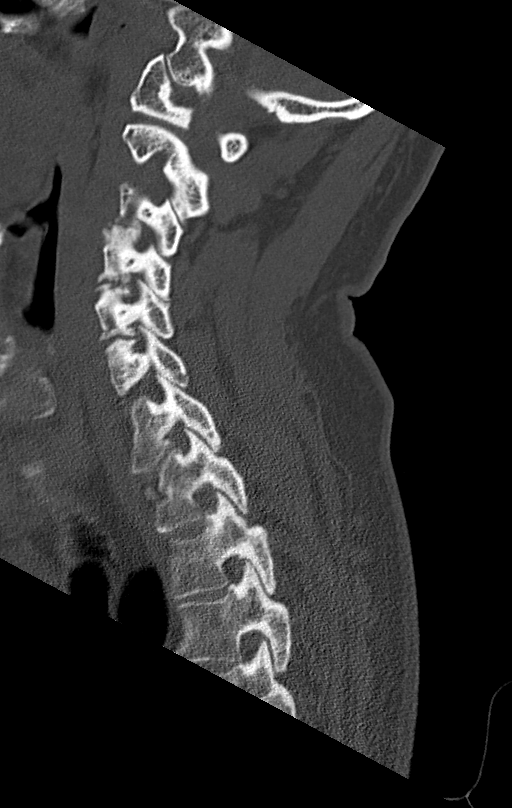
[im 31/61  soft-tissue]
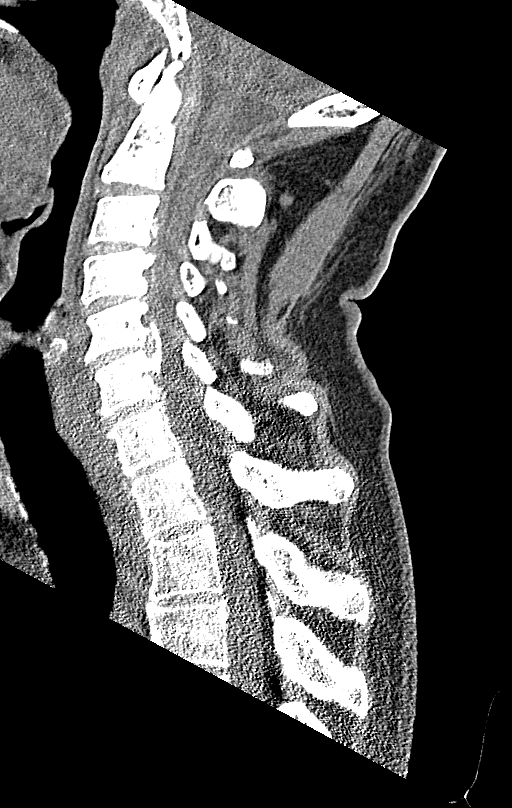
[im 31/61  bone]
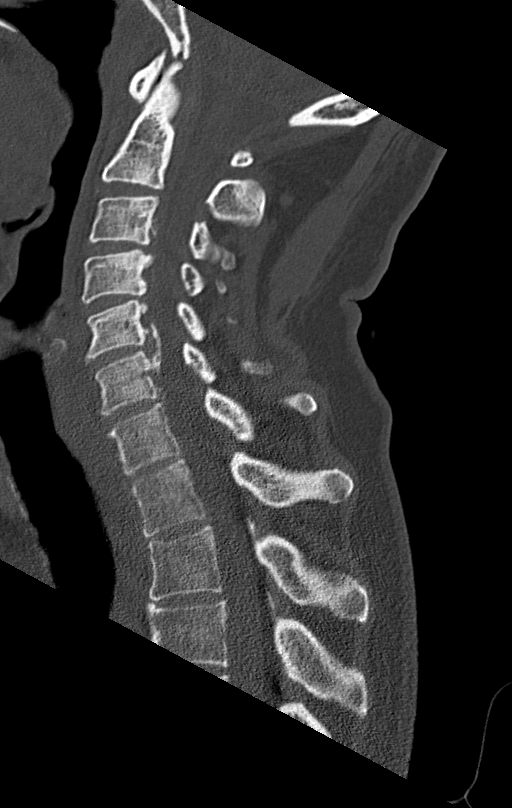
[im 36/61  bone]
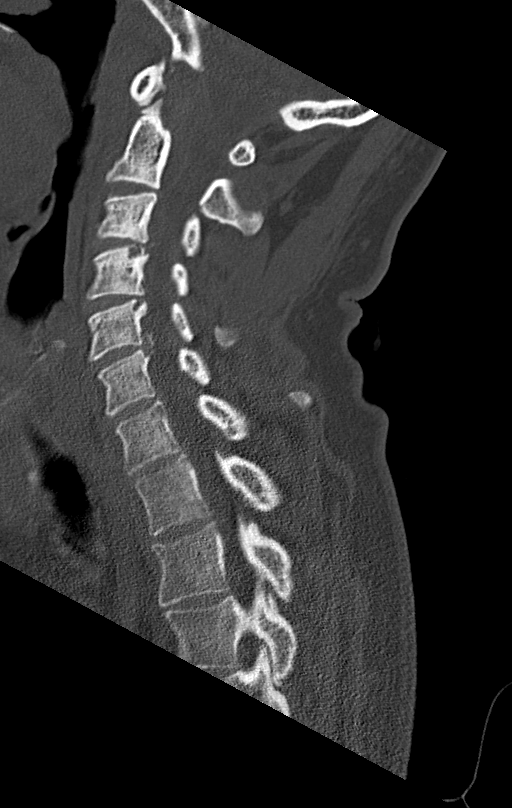
[im 41/61  bone]
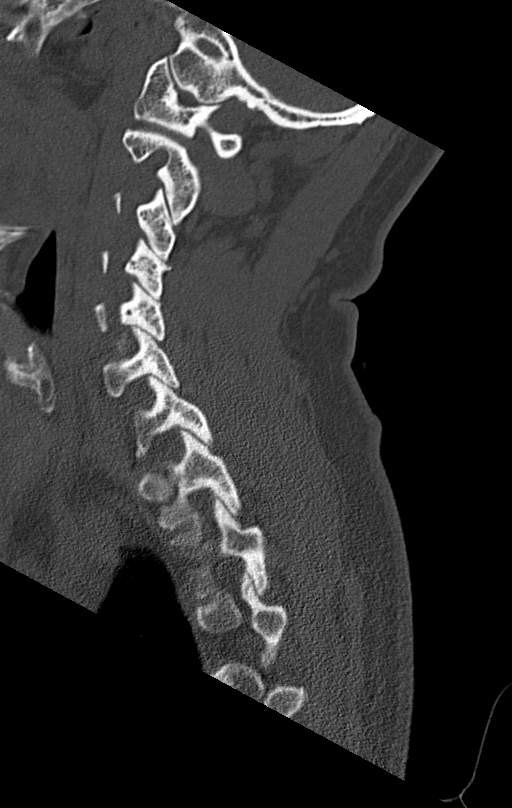

[10 of 33 positions shown; findings below may reference images not displayed]

FINDINGS: CT HEAD FINDINGS

Brain: There is no acute intracranial hemorrhage, extra-axial fluid
collection, or acute infarct.

Parenchymal volume is normal for age. The ventricles are normal in
size. Gray-white differentiation is preserved.

There is no mass lesion.  There is no mass effect or midline shift.

Vascular: No hyperdense vessel or unexpected calcification.

Skull: Normal. Negative for fracture or focal lesion.

Sinuses/Orbits: The imaged paranasal sinuses are clear. The globes
and orbits are unremarkable.

Other: None.

CT CERVICAL SPINE FINDINGS

Alignment: Normal.

Skull base and vertebrae: Skull base alignment is normal. Vertebral
body heights are preserved. There is no evidence of acute fracture.
There is no suspicious osseous lesion.

Soft tissues and spinal canal: No prevertebral fluid or swelling. No
visible canal hematoma.

Disc levels: There is ossification of the posterior longitudinal
ligament from C3 through C6 resulting in up to mild spinal canal
stenosis. There is mild multilevel degenerative endplate change and
facet arthropathy resulting in up to moderate to severe bilateral
neural foraminal stenosis C4-C5 and C5-C6.

Upper chest: The imaged lung apices are clear.

Other: None.
IMPRESSION: 1. No acute intracranial pathology.
2. No acute fracture or traumatic malalignment of the cervical
spine.
3. Multilevel degenerative changes and ossification of the posterior
longitudinal ligament as above.

## 2023-06-04 IMAGING — CT CT HEAD W/O CM
3 series · 14 of 47 positions shown, 16 images · non-contrast
Comparison: CT head and cervical spine 09/06/2009

CLINICAL DATA: Syncope loss of consciousness



[Series 3: head wo · axial · 0.46mm/px · z∈[+728,+858]mm · 8 of 32 slices shown, 10 images]
[im 3/32  brain]
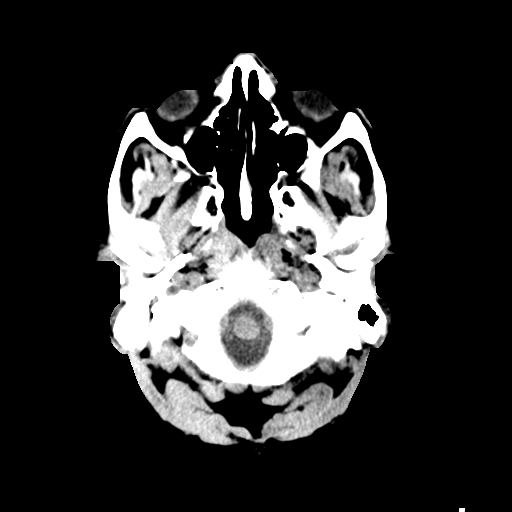
[im 3/32  bone]
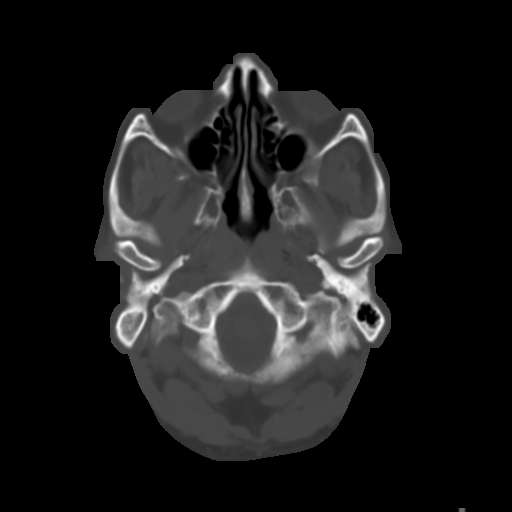
[im 7/32  brain]
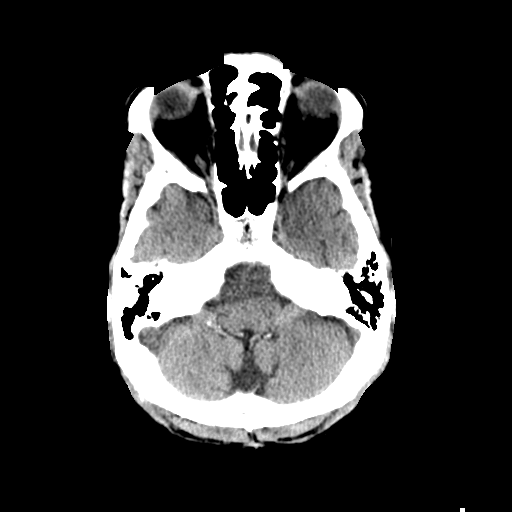
[im 10/32  brain]
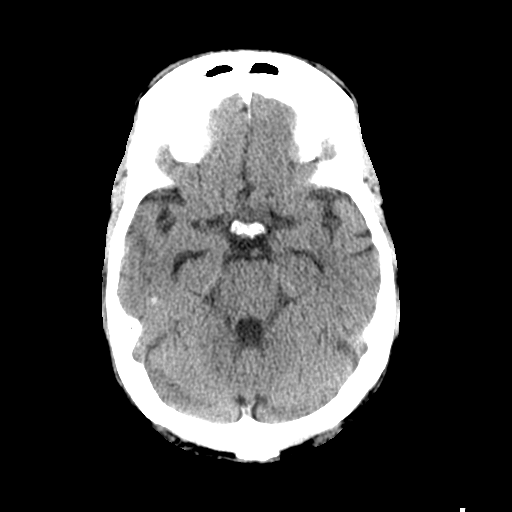
[im 14/32  brain]
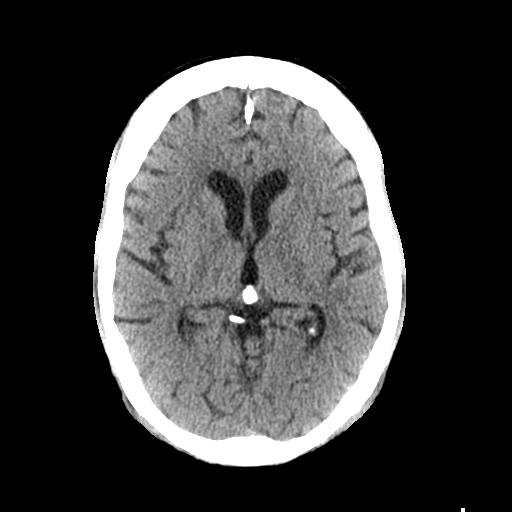
[im 18/32  brain]
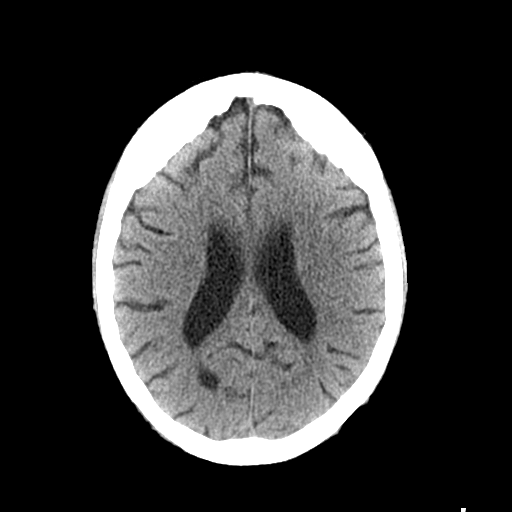
[im 18/32  bone]
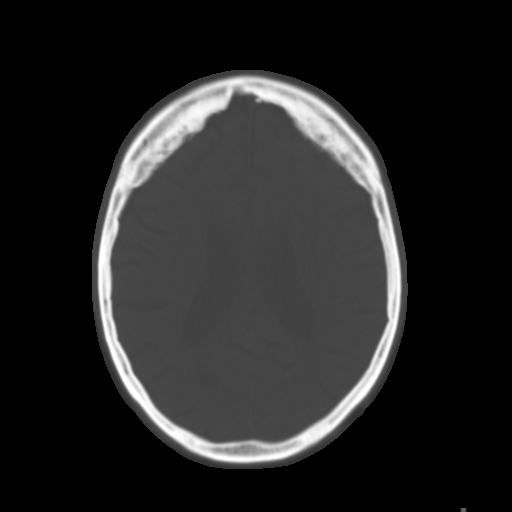
[im 22/32  brain]
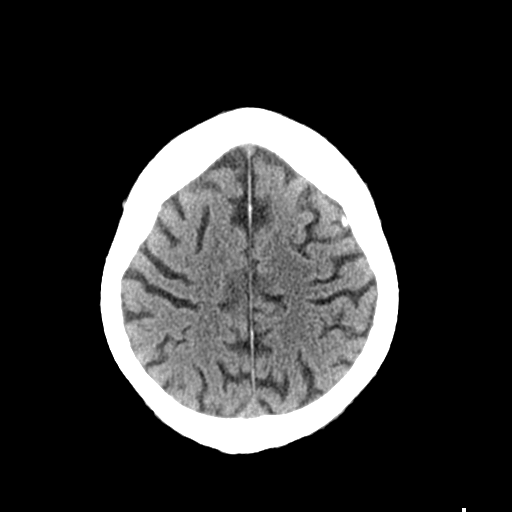
[im 25/32  brain]
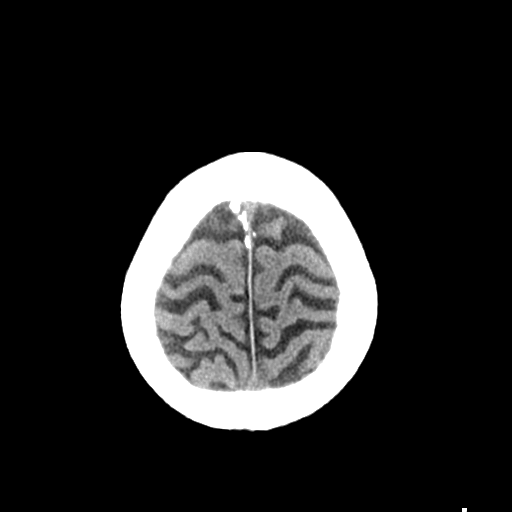
[im 29/32  brain]
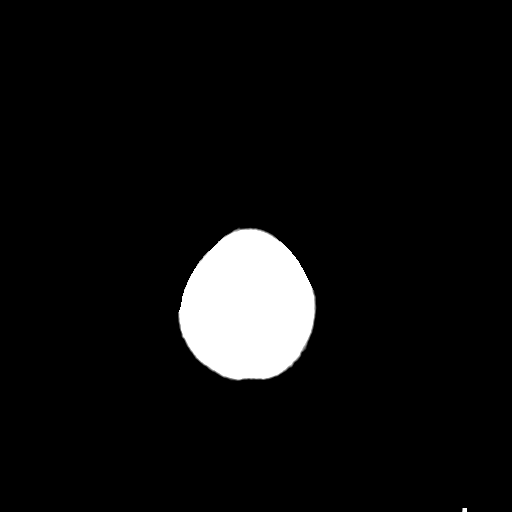

[Series 6: coronal soft tissue · coronal · 0.31mm/px · 3 of 76 slices shown]
[im 26/76  brain]
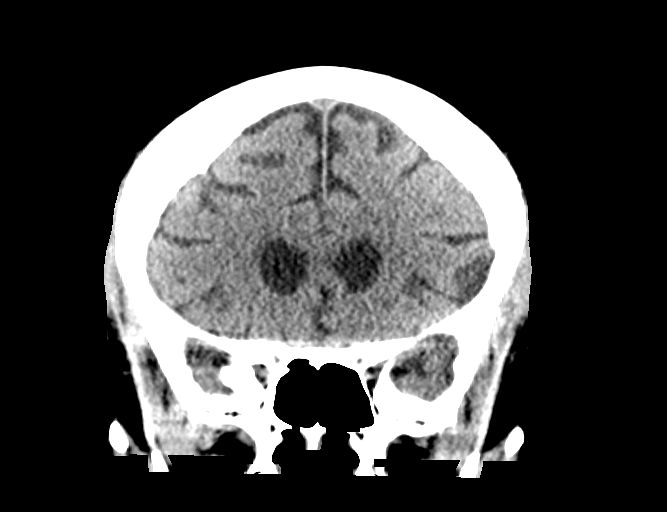
[im 34/76  brain]
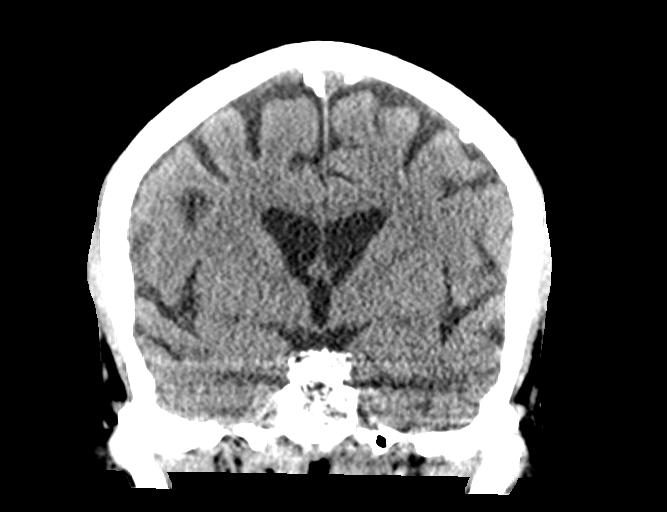
[im 42/76  brain]
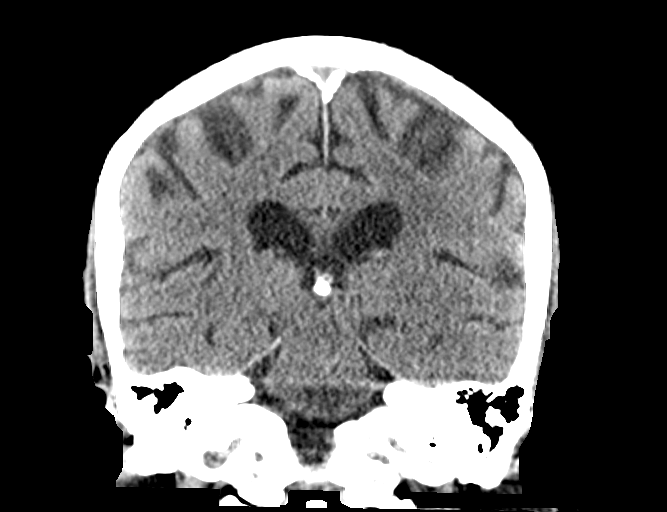

[Series 7: sagittal soft tissue · sagittal · 0.34mm/px · 3 of 66 slices shown]
[im 22/66  brain]
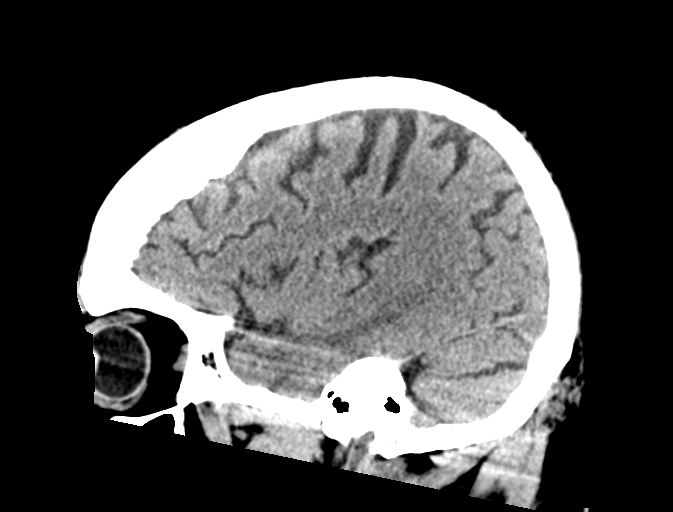
[im 33/66  brain]
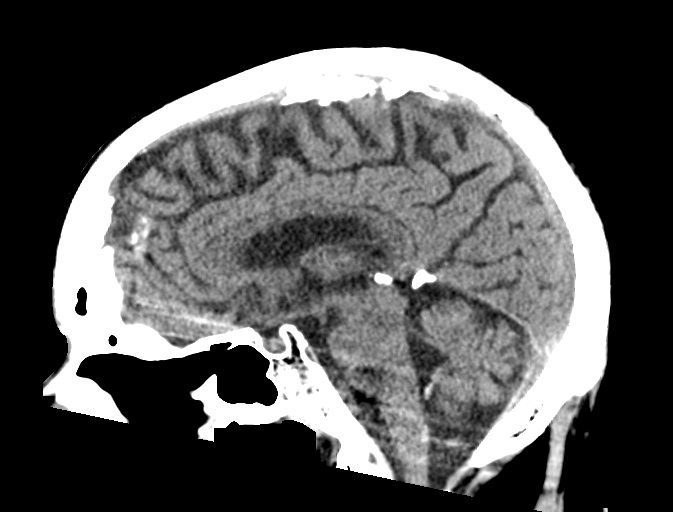
[im 44/66  brain]
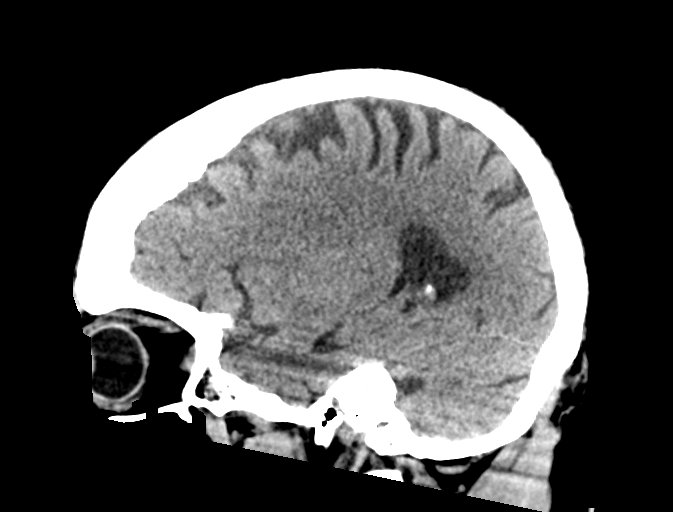

[14 of 47 positions shown; findings below may reference images not displayed]

FINDINGS: CT HEAD FINDINGS

Brain: There is no acute intracranial hemorrhage, extra-axial fluid
collection, or acute infarct.

Parenchymal volume is normal for age. The ventricles are normal in
size. Gray-white differentiation is preserved.

There is no mass lesion.  There is no mass effect or midline shift.

Vascular: No hyperdense vessel or unexpected calcification.

Skull: Normal. Negative for fracture or focal lesion.

Sinuses/Orbits: The imaged paranasal sinuses are clear. The globes
and orbits are unremarkable.

Other: None.

CT CERVICAL SPINE FINDINGS

Alignment: Normal.

Skull base and vertebrae: Skull base alignment is normal. Vertebral
body heights are preserved. There is no evidence of acute fracture.
There is no suspicious osseous lesion.

Soft tissues and spinal canal: No prevertebral fluid or swelling. No
visible canal hematoma.

Disc levels: There is ossification of the posterior longitudinal
ligament from C3 through C6 resulting in up to mild spinal canal
stenosis. There is mild multilevel degenerative endplate change and
facet arthropathy resulting in up to moderate to severe bilateral
neural foraminal stenosis C4-C5 and C5-C6.

Upper chest: The imaged lung apices are clear.

Other: None.
IMPRESSION: 1. No acute intracranial pathology.
2. No acute fracture or traumatic malalignment of the cervical
spine.
3. Multilevel degenerative changes and ossification of the posterior
longitudinal ligament as above.

## 2023-06-08 ENCOUNTER — Ambulatory Visit (INDEPENDENT_AMBULATORY_CARE_PROVIDER_SITE_OTHER): Payer: Medicare HMO | Admitting: Internal Medicine

## 2023-06-08 ENCOUNTER — Encounter: Payer: Self-pay | Admitting: Internal Medicine

## 2023-06-08 VITALS — BP 120/68 | HR 70 | Temp 98.6°F | Ht 72.0 in | Wt 166.0 lb

## 2023-06-08 DIAGNOSIS — E60 Dietary zinc deficiency: Secondary | ICD-10-CM

## 2023-06-08 DIAGNOSIS — Z23 Encounter for immunization: Secondary | ICD-10-CM | POA: Diagnosis not present

## 2023-06-08 DIAGNOSIS — G8929 Other chronic pain: Secondary | ICD-10-CM | POA: Diagnosis not present

## 2023-06-08 DIAGNOSIS — D638 Anemia in other chronic diseases classified elsewhere: Secondary | ICD-10-CM

## 2023-06-08 DIAGNOSIS — R234 Changes in skin texture: Secondary | ICD-10-CM | POA: Diagnosis not present

## 2023-06-08 DIAGNOSIS — M25562 Pain in left knee: Secondary | ICD-10-CM | POA: Diagnosis not present

## 2023-06-08 DIAGNOSIS — M25561 Pain in right knee: Secondary | ICD-10-CM

## 2023-06-08 DIAGNOSIS — E538 Deficiency of other specified B group vitamins: Secondary | ICD-10-CM

## 2023-06-08 LAB — CBC WITH DIFFERENTIAL/PLATELET
Basophils Absolute: 0 10*3/uL (ref 0.0–0.1)
Basophils Relative: 0.6 % (ref 0.0–3.0)
Eosinophils Absolute: 0.2 10*3/uL (ref 0.0–0.7)
Eosinophils Relative: 3.7 % (ref 0.0–5.0)
HCT: 29.3 % — ABNORMAL LOW (ref 39.0–52.0)
Hemoglobin: 9.7 g/dL — ABNORMAL LOW (ref 13.0–17.0)
Lymphocytes Relative: 27.5 % (ref 12.0–46.0)
Lymphs Abs: 1.1 10*3/uL (ref 0.7–4.0)
MCHC: 33.1 g/dL (ref 30.0–36.0)
MCV: 90.9 fl (ref 78.0–100.0)
Monocytes Absolute: 0.4 10*3/uL (ref 0.1–1.0)
Monocytes Relative: 9.6 % (ref 3.0–12.0)
Neutro Abs: 2.4 10*3/uL (ref 1.4–7.7)
Neutrophils Relative %: 58.6 % (ref 43.0–77.0)
Platelets: 94 10*3/uL — ABNORMAL LOW (ref 150.0–400.0)
RBC: 3.22 Mil/uL — ABNORMAL LOW (ref 4.22–5.81)
RDW: 16.1 % — ABNORMAL HIGH (ref 11.5–15.5)
WBC: 4.2 10*3/uL (ref 4.0–10.5)

## 2023-06-08 LAB — COMPREHENSIVE METABOLIC PANEL
ALT: 16 U/L (ref 0–53)
AST: 28 U/L (ref 0–37)
Albumin: 3.8 g/dL (ref 3.5–5.2)
Alkaline Phosphatase: 148 U/L — ABNORMAL HIGH (ref 39–117)
BUN: 19 mg/dL (ref 6–23)
CO2: 24 mEq/L (ref 19–32)
Calcium: 9.2 mg/dL (ref 8.4–10.5)
Chloride: 112 mEq/L (ref 96–112)
Creatinine, Ser: 1.65 mg/dL — ABNORMAL HIGH (ref 0.40–1.50)
GFR: 43.44 mL/min — ABNORMAL LOW (ref >60.00)
Glucose, Bld: 107 mg/dL — ABNORMAL HIGH (ref 70–99)
Potassium: 4.4 mEq/L (ref 3.5–5.1)
Sodium: 143 mEq/L (ref 135–145)
Total Bilirubin: 0.8 mg/dL (ref 0.2–1.2)
Total Protein: 7.7 g/dL (ref 6.0–8.3)

## 2023-06-08 NOTE — Assessment & Plan Note (Signed)
On B12 

## 2023-06-08 NOTE — Assessment & Plan Note (Addendum)
R knee - worse Ortho ref Blue-Emu cream

## 2023-06-08 NOTE — Assessment & Plan Note (Addendum)
Zinc 220 mg bid Risks associated with treatment noncompliance were discussed. Compliance was encouraged. Monitor Zinc level Taking Zinc every day, not bid: take bid

## 2023-06-08 NOTE — Progress Notes (Signed)
Subjective:  Patient ID: Johnny Mcknight, male    DOB: [birth year]  Age: 67 y.o. MRN: [id number]  CC: No chief complaint on file.   HPI Johnny Mcknight presents for R knee pain, GI bleed C/o rash -not better Taking Zinc every day, not bid  Outpatient Medications Prior to Visit  Medication Sig Dispense Refill   B Complex-Folic Acid (B COMPLEX PLUS) TABS Take 1 tablet by mouth daily. 100 tablet 3   Cholecalciferol (VITAMIN D3) 50 MCG (2000 UT) capsule Take 1 capsule (2,000 Units total) by mouth daily. 100 capsule 3   hydrocortisone (ANUSOL-HC) 2.5 % rectal cream Place rectally 2 (two) times daily. (Patient taking differently: Place 1 application  rectally 2 (two) times daily as needed for hemorrhoids.) 30 g 0   Iron, Ferrous Sulfate, 325 (65 Fe) MG TABS Take 325 mg by mouth daily. 30 tablet 5   pantoprazole (PROTONIX) 40 MG tablet Take 1 tablet (40 mg total) by mouth daily. 90 tablet 3   promethazine (PHENERGAN) 25 MG tablet TAKE 1 TABLET BY MOUTH EVERY 8 HOURS AS NEEDED FOR NAUSEA FOR VOMITING OR  USE  FOR  HICCUPS 60 tablet 2   triamcinolone cream (KENALOG) 0.5 % Apply 1 Application topically 3 (three) times daily. 90 g 3   Zinc 100 MG TABS 1 po qd 100 tablet 3   No facility-administered medications prior to visit.    ROS: Review of Systems  Constitutional:  Positive for fatigue. Negative for appetite change and unexpected weight change.  HENT:  Negative for congestion, nosebleeds, sneezing, sore throat and trouble swallowing.   Eyes:  Negative for itching and visual disturbance.  Respiratory:  Negative for cough.   Cardiovascular:  Negative for chest pain, palpitations and leg swelling.  Gastrointestinal:  Negative for abdominal distention, blood in stool, diarrhea and nausea.  Genitourinary:  Negative for frequency and hematuria.  Musculoskeletal:  Negative for back pain, gait problem, joint swelling and neck pain.  Skin:  Positive for color change and rash.  Neurological:  Negative for  dizziness, tremors, speech difficulty and weakness.  Psychiatric/Behavioral:  Negative for agitation, dysphoric mood and sleep disturbance. The patient is not nervous/anxious.     Objective:  BP 120/68 (BP Location: Left Arm, Patient Position: Sitting, Cuff Size: Large)   Pulse 70   Temp 98.6 F (37 C) (Oral)   Ht 6' (1.829 m)   Wt 166 lb (75.3 kg)   SpO2 94%   BMI 22.51 kg/m   BP Readings from Last 3 Encounters:  06/08/23 120/68  04/12/23 114/68  01/11/23 134/82    Wt Readings from Last 3 Encounters:  06/08/23 166 lb (75.3 kg)  04/12/23 168 lb (76.2 kg)  01/11/23 168 lb (76.2 kg)    Physical Exam Constitutional:      General: He is not in acute distress.    Appearance: He is well-developed.     Comments: NAD  Eyes:     Conjunctiva/sclera: Conjunctivae normal.     Pupils: Pupils are equal, round, and reactive to light.  Neck:     Thyroid: No thyromegaly.     Vascular: No JVD.  Cardiovascular:     Rate and Rhythm: Normal rate and regular rhythm.     Heart sounds: Normal heart sounds. No murmur heard.    No friction rub. No gallop.  Pulmonary:     Effort: Pulmonary effort is normal. No respiratory distress.     Breath sounds: Normal breath sounds. No wheezing or rales.  Chest:     Chest wall: No tenderness.  Abdominal:     General: Bowel sounds are normal. There is no distension.     Palpations: Abdomen is soft. There is no mass.     Tenderness: There is no abdominal tenderness. There is no guarding or rebound.  Musculoskeletal:        General: No tenderness. Normal range of motion.     Cervical back: Normal range of motion.  Lymphadenopathy:     Cervical: No cervical adenopathy.  Skin:    General: Skin is warm and dry.     Findings: No rash.  Neurological:     Mental Status: He is alert and oriented to person, place, and time.     Cranial Nerves: No cranial nerve deficit.     Motor: No abnormal muscle tone.     Coordination: Coordination normal.      Gait: Gait normal.     Deep Tendon Reflexes: Reflexes are normal and symmetric.  Psychiatric:        Behavior: Behavior normal.        Thought Content: Thought content normal.        Judgment: Judgment normal.   Hyperpigmented, hyperkeratotic extensive rash on the back, trunk, extremities Knees with pain with range of motion  Lab Results  Component Value Date   WBC 4.2 06/08/2023   HGB 9.7 (L) 06/08/2023   HCT 29.3 (L) 06/08/2023   PLT 94.0 (L) 06/08/2023   GLUCOSE 107 (H) 06/08/2023   CHOL 157 08/20/2018   TRIG 79.0 08/20/2018   HDL 84.20 08/20/2018   LDLCALC 57 08/20/2018   ALT 16 06/08/2023   AST 28 06/08/2023   NA 143 06/08/2023   K 4.4 06/08/2023   CL 112 06/08/2023   CREATININE 1.65 (H) 06/08/2023   BUN 19 06/08/2023   CO2 24 06/08/2023   TSH 2.43 03/24/2020   PSA 1.76 08/20/2018   INR 1.2 02/16/2022   HGBA1C 4.6 04/03/2018    MR Abdomen W Wo Contrast  Result Date: 03/27/2022 CLINICAL DATA:  Abnormal CT, cirrhosis EXAM: MRI ABDOMEN WITHOUT AND WITH CONTRAST TECHNIQUE: Multiplanar multisequence MR imaging of the abdomen was performed both before and after the administration of intravenous contrast. CONTRAST:  14mL MULTIHANCE GADOBENATE DIMEGLUMINE 529 MG/ML IV SOLN COMPARISON:  CT abdomen 02/18/2022 FINDINGS: Lower chest: No acute findings. Hepatobiliary: Liver is nodular with diffuse heterogeneous parenchyma, consistent with cirrhosis. Diffusely inhomogeneous appearance of the parenchyma on postcontrast sequences with no focal arterially enhancing/delayed washout mass identified. There is a subcentimeter cyst in the inferior right lobe segment 4/5. Gallbladder is nearly filled with small stones. No wall thickening or pericholecystic edema. No biliary ductal dilatation. Pancreas: No mass, inflammatory changes, or other parenchymal abnormality identified. Spleen:  Enlarged measuring up to 13.2 cm in length. Adrenals/Urinary Tract: Adrenal glands appear normal. Small cyst in  the lower pole left kidney. No hydronephrosis. Stomach/Bowel: Colonic diverticulosis. Vascular/Lymphatic: No pathologically enlarged lymph nodes identified. 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 No abdominal aortic aneurysm demonstrated. Other:  No ascites. Musculoskeletal: No suspicious bone lesions identified. IMPRESSION: 1. Hepatic cirrhosis with no suspicious mass identified. Small hepatic cyst. 2. 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 3. Colonic diverticulosis. Electronically Signed   By: Jannifer Hick M.D.   On: 03/27/2022 18:24    Assessment & Plan:   Problem List Items Addressed This Visit     Anemia of chronic disease   Relevant Orders  CBC with Differential/Platelet (Completed)   Comprehensive metabolic panel (Completed)   Iron, TIBC and Ferritin Panel (Completed)   Knee pain, bilateral    R knee - worse Ortho ref Blue-Emu cream       Relevant Orders   Ambulatory referral to Orthopedic Surgery   B12 deficiency    On B12      Zinc deficiency    Zinc 220 mg bid Risks associated with treatment noncompliance were discussed. Compliance was encouraged. Monitor Zinc level Taking Zinc every day, not bid: take bid      Relevant Orders   Comprehensive metabolic panel (Completed)   Scaly skin    Rash is likely related to zinc deficiency.  Increase Zinc intake      Other Visit Diagnoses     Need for influenza vaccination    -  Primary   Relevant Orders   Flu Vaccine Trivalent High Dose (Fluad) (Completed)         No orders of the defined types were placed in this encounter.     Follow-up: Return in about 3 months (around 09/07/2023) for a follow-up visit.  Sonda Primes, MD

## 2023-06-09 LAB — IRON,TIBC AND FERRITIN PANEL
%SAT: 9 % (calc) — ABNORMAL LOW (ref 20–48)
Ferritin: 33 ng/mL (ref 24–380)
Iron: 32 ug/dL — ABNORMAL LOW (ref 50–180)
TIBC: 352 mcg/dL (calc) (ref 250–425)

## 2023-06-13 NOTE — Assessment & Plan Note (Signed)
Rash is likely related to zinc deficiency.  Increase Zinc intake

## 2023-06-19 ENCOUNTER — Ambulatory Visit (INDEPENDENT_AMBULATORY_CARE_PROVIDER_SITE_OTHER): Payer: Medicare HMO

## 2023-06-19 VITALS — Ht 72.0 in | Wt 166.0 lb

## 2023-06-19 DIAGNOSIS — Z0000 Encounter for general adult medical examination without abnormal findings: Secondary | ICD-10-CM | POA: Diagnosis not present

## 2023-06-19 NOTE — Patient Instructions (Signed)
Johnny Mcknight , Thank you for taking time to come for your Medicare Wellness Visit. I appreciate your ongoing commitment to your health goals. Please review the following plan we discussed and let me know if I can assist you in the future.   Referrals/Orders/Follow-Ups/Clinician Recommendations: You are due for a Tetanus vaccine.  Aim for 30 minutes of exercise or brisk walking, 6-8 glasses of water, and 5 servings of fruits and vegetables each day.   This is a list of the screening recommended for you and due dates:  Health Maintenance  Topic Date Due   DTaP/Tdap/Td vaccine (1 - Tdap) Never done   Pneumonia Vaccine (2 of 2 - PCV) 09/24/2021   Medicare Annual Wellness Visit  06/18/2024   Colon Cancer Screening  03/02/2032   Hepatitis C Screening  Completed   HIV Screening  Completed   HPV Vaccine  Aged Out   Flu Shot  Discontinued   COVID-19 Vaccine  Discontinued   Zoster (Shingles) Vaccine  Discontinued    Advanced directives: (Copy Requested) Please bring a copy of your health care power of attorney and living will to the office to be added to your chart at your convenience.  Next Medicare Annual Wellness Visit scheduled for next year: Yes

## 2023-06-19 NOTE — Progress Notes (Addendum)
Subjective:   Johnny Mcknight is a 67 y.o. male who presents for Medicare Annual/Subsequent preventive examination.  Visit Complete: Virtual  I connected with  [name] on 06/19/23 by a audio enabled telemedicine application and verified that I am speaking with the correct person using two identifiers.  Patient Location: Home  Provider Location: Office/Clinic  I discussed the limitations of evaluation and management by telemedicine. The patient expressed understanding and agreed to proceed.  Because this visit was a virtual/telehealth visit, some criteria may be missing or patient reported. Any vitals not documented were not able to be obtained and vitals that have been documented are patient reported.    Cardiac Risk Factors include: advanced age (>106men, >50 women);male gender;hypertension;Other (see comment), Risk factor comments: COPD, Liver cirrhosis     Objective:    Today's Vitals   06/19/23 0943  Weight: 166 lb (75.3 kg)  Height: 6' (1.829 m)   Body mass index is 22.51 kg/m.     06/19/2023    9:53 AM 05/30/2022    9:50 AM 03/05/2022   12:47 PM 03/02/2022    1:28 PM 02/28/2022    5:44 PM 02/28/2022    2:30 PM 02/16/2022    3:00 PM  Advanced Directives  Does Patient Have a Medical Advance Directive? No No No No No No   Would patient like information on creating a medical advance directive?  No - Patient declined No - Patient declined  No - Patient declined  No - Patient declined    Current Medications (verified) Outpatient Encounter Medications as of 06/19/2023  Medication Sig   B Complex-Folic Acid (B COMPLEX PLUS) TABS Take 1 tablet by mouth daily.   Cholecalciferol (VITAMIN D3) 50 MCG (2000 UT) capsule Take 1 capsule (2,000 Units total) by mouth daily.   hydrocortisone (ANUSOL-HC) 2.5 % rectal cream Place rectally 2 (two) times daily. (Patient taking differently: Place 1 application  rectally 2 (two) times daily as needed for hemorrhoids.)   Iron, Ferrous Sulfate,  325 (65 Fe) MG TABS Take 325 mg by mouth daily.   pantoprazole (PROTONIX) 40 MG tablet Take 1 tablet (40 mg total) by mouth daily.   promethazine (PHENERGAN) 25 MG tablet TAKE 1 TABLET BY MOUTH EVERY 8 HOURS AS NEEDED FOR NAUSEA FOR VOMITING OR  USE  FOR  HICCUPS   triamcinolone cream (KENALOG) 0.5 % Apply 1 Application topically 3 (three) times daily.   Zinc 100 MG TABS 1 po qd   No facility-administered encounter medications on file as of 06/19/2023.    Allergies (verified) Doxycycline, Omeprazole-sodium bicarbonate, and Propranolol   History: Past Medical History:  Diagnosis Date   GERD (gastroesophageal reflux disease)    Polyneuropathy, alcoholic (HCC) 03/23/2017   Gabapentin prn d/c B complex po 11/22 Re-start B complex po Risks associated with treatment noncompliance were discussed. Compliance was encouraged. Cane   Streptococcal pneumonia (HCC) 09/27/2015   1/17   Thiamine deficiency 10/02/2020   Alcohol related.  On B complex Risks associated with treatment noncompliance were discussed. Compliance was encouraged.    Vitamin D deficiency 05/14/2018   2019   Zinc deficiency 04/03/2020   2021 Zinc 220 mg bid Risks associated with treatment noncompliance were discussed. Compliance was encouraged.   Past Surgical History:  Procedure Laterality Date   BIOPSY  02/18/2022   Procedure: BIOPSY;  Surgeon: Lynann Bologna, MD;  Location: [facility];  Service: Gastroenterology;;   BIOPSY  03/02/2022   Procedure: BIOPSY;  Surgeon: Tressia Danas, MD;  Location:  [facility];  Service: Gastroenterology;;   BIOPSY  03/05/2022   Procedure: BIOPSY;  Surgeon: Tressia Danas, MD;  Location: [facility];  Service: Gastroenterology;;   COLONOSCOPY WITH PROPOFOL N/A 02/18/2022   Procedure: COLONOSCOPY WITH PROPOFOL;  Surgeon: Lynann Bologna, MD;  Location: [facility];  Service: Gastroenterology;  Laterality: N/A;   COLONOSCOPY WITH PROPOFOL N/A 03/02/2022   Procedure: COLONOSCOPY WITH  PROPOFOL;  Surgeon: Tressia Danas, MD;  Location: [facility];  Service: Gastroenterology;  Laterality: N/A;   ENTEROSCOPY N/A 03/05/2022   Procedure: ENTEROSCOPY;  Surgeon: Tressia Danas, MD;  Location: [facility];  Service: Gastroenterology;  Laterality: N/A;   ESOPHAGOGASTRODUODENOSCOPY (EGD) WITH PROPOFOL N/A 02/18/2022   Procedure: ESOPHAGOGASTRODUODENOSCOPY (EGD) WITH PROPOFOL;  Surgeon: Lynann Bologna, MD;  Location: [facility];  Service: Gastroenterology;  Laterality: N/A;   ESOPHAGOGASTRODUODENOSCOPY (EGD) WITH PROPOFOL N/A 03/03/2022   Procedure: ESOPHAGOGASTRODUODENOSCOPY (EGD) WITH PROPOFOL;  Surgeon: Tressia Danas, MD;  Location: [facility];  Service: Gastroenterology;  Laterality: N/A;   GIVENS CAPSULE STUDY  03/03/2022   Procedure: GIVENS CAPSULE STUDY;  Surgeon: Tressia Danas, MD;  Location: [facility];  Service: Gastroenterology;;   HOT HEMOSTASIS N/A 03/05/2022   Procedure: HOT HEMOSTASIS (ARGON PLASMA COAGULATION/BICAP);  Surgeon: Tressia Danas, MD;  Location: [facility];  Service: Gastroenterology;  Laterality: N/A;   POLYPECTOMY  02/18/2022   Procedure: POLYPECTOMY;  Surgeon: Lynann Bologna, MD;  Location: [facility];  Service: Gastroenterology;;   TEE WITHOUT CARDIOVERSION N/A 09/29/2015   Procedure: TRANSESOPHAGEAL ECHOCARDIOGRAM (TEE);  Surgeon: Jake Bathe, MD;  Location: [facility];  Service: Cardiovascular;  Laterality: N/A;   Family History  Problem Relation Age of Onset   Hypertension Other    Heart disease Father 33       MI   Hypertension Father    Hypertension Mother    Diabetes Mother    Diabetes Maternal Aunt    Stroke Maternal Aunt    Diabetes Maternal Grandmother    Social History   Socioeconomic History   Marital status: Widowed    Spouse name: Not on file   Number of children: 0   Years of education: Not on file   Highest education level: Not on file  Occupational History   Occupation: Disabled.  Tobacco Use    Smoking status: Some Days    Types: Pipe   Smokeless tobacco: Never  Vaping Use   Vaping status: Never Used  Substance and Sexual Activity   Alcohol use: Not Currently   Drug use: No   Sexual activity: Yes  Other Topics Concern   Not on file  Social History Narrative   Lives with someone.   Social Determinants of Health   Financial Resource Strain: Medium Risk (06/19/2023)   Overall Financial Resource Strain (CARDIA)    Difficulty of Paying Living Expenses: Somewhat hard  Food Insecurity: No Food Insecurity (06/19/2023)   Hunger Vital Sign    Worried About Running Out of Food in the Last Year: Never true    Ran Out of Food in the Last Year: Never true  Transportation Needs: No Transportation Needs (06/19/2023)   PRAPARE - Administrator, Civil Service (Medical): No    Lack of Transportation (Non-Medical): No  Physical Activity: Sufficiently Active (06/19/2023)   Exercise Vital Sign    Days of Exercise per Week: 4 days    Minutes of Exercise per Session: 40 min  Stress: No Stress Concern Present (06/19/2023)   [employer] of Occupational Health - Occupational Stress Questionnaire  Feeling of Stress : Not at all  Social Connections: Moderately Isolated (06/19/2023)   Social Connection and Isolation Panel [NHANES]    Frequency of Communication with Friends and Family: More than three times a week    Frequency of Social Gatherings with Friends and Family: Three times a week    Attends Religious Services: 1 to 4 times per year    Active Member of Clubs or Organizations: No    Attends Banker Meetings: Never    Marital Status: Widowed    Tobacco Counseling Ready to quit: Not Answered Counseling given: Not Answered   Clinical Intake:  Pre-visit preparation completed: Yes  Pain : No/denies pain     BMI - recorded: 22.51 Nutritional Risks: None  How often do you need to have someone help you when you read instructions, pamphlets, or other  written materials from your doctor or pharmacy?: 1 - Never  Interpreter Needed?: No  Information entered by :: Joshau Code, RMA   Activities of Daily Living    06/19/2023    9:50 AM  In your present state of health, do you have any difficulty performing the following activities:  Hearing? 0  Vision? 0  Difficulty concentrating or making decisions? 0  Walking or climbing stairs? 1  Comment knees bother him.  Dressing or bathing? 0  Doing errands, shopping? 0  Preparing Food and eating ? N  Using the Toilet? N  In the past six months, have you accidently leaked urine? N  Do you have problems with loss of bowel control? N  Managing your Medications? N  Managing your Finances? N  Housekeeping or managing your Housekeeping? N    Patient Care Team: Plotnikov, Georgina Quint, MD as PCP - General (Internal Medicine) Beverely Low, MD as Consulting Physician (Orthopedic Surgery)  Indicate any recent Medical Services you may have received from other than [facility] providers in the past year (date may be approximate).     Assessment:   This is a routine wellness examination for Shakai.  Hearing/Vision screen Hearing Screening - Comments:: Denies hearing difficulties   Vision Screening - Comments:: Wears eyeglasses   Goals Addressed             This Visit's Progress    To maintain my current health status by continuing to eat healthy, stay physically active and socially active.   On track     Depression Screen    06/19/2023    9:56 AM 06/08/2023   10:52 AM 04/12/2023   10:08 AM 01/11/2023   10:07 AM 09/21/2022    9:13 AM 05/30/2022   11:10 AM 05/30/2022    9:53 AM  PHQ 2/9 Scores  PHQ - 2 Score 0 0 0 0 0 0 0  PHQ- 9 Score 1    0      Fall Risk    06/19/2023    9:53 AM 06/08/2023   10:52 AM 04/12/2023   10:08 AM 01/11/2023   10:07 AM 09/21/2022    9:13 AM  Fall Risk   Falls in the past year? 0 0 0 0 0  Number falls in past yr: 0 0 0 0 0  Injury with Fall? 0 0 0 0 0  Risk for  fall due to : No Fall Risks No Fall Risks No Fall Risks No Fall Risks No Fall Risks  Follow up Falls prevention discussed;Falls evaluation completed Falls evaluation completed Falls evaluation completed Falls evaluation completed Falls evaluation completed    MEDICARE  RISK AT HOME: Medicare Risk at Home Any stairs in or around the home?: Yes If so, are there any without handrails?: Yes Home free of loose throw rugs in walkways, pet beds, electrical cords, etc?: Yes Adequate lighting in your home to reduce risk of falls?: Yes Life alert?: No Use of a cane, walker or w/c?: No Grab bars in the bathroom?: No Shower chair or bench in shower?: No Elevated toilet seat or a handicapped toilet?: No  TIMED UP AND GO:  Was the test performed?  No    Cognitive Function:        06/19/2023    9:54 AM 05/30/2022   11:11 AM  6CIT Screen  What Year? 0 points 0 points  What month? 0 points 0 points  What time? 0 points 0 points  Count back from 20 0 points 0 points  Months in reverse 0 points 0 points  Repeat phrase 0 points 0 points  Total Score 0 points 0 points    Immunizations Immunization History  Administered Date(s) Administered   Fluad Trivalent(High Dose 65+) 06/08/2023   Influenza,inj,Quad PF,6+ Mos 06/16/2022   PFIZER(Purple Top)SARS-COV-2 Vaccination 12/19/2019, 01/01/2020   Pneumococcal Polysaccharide-23 09/24/2020    TDAP status: Due, Education has been provided regarding the importance of this vaccine. Advised may receive this vaccine at local pharmacy or Health Dept. Aware to provide a copy of the vaccination record if obtained from local pharmacy or Health Dept. Verbalized acceptance and understanding.  Flu Vaccine status: Up to date  Pneumococcal vaccine status: Due, Education has been provided regarding the importance of this vaccine. Advised may receive this vaccine at local pharmacy or Health Dept. Aware to provide a copy of the vaccination record if obtained from  local pharmacy or Health Dept. Verbalized acceptance and understanding.  Covid-19 vaccine status: Declined, Education has been provided regarding the importance of this vaccine but patient still declined. Advised may receive this vaccine at local pharmacy or Health Dept.or vaccine clinic. Aware to provide a copy of the vaccination record if obtained from local pharmacy or Health Dept. Verbalized acceptance and understanding.  Qualifies for Shingles Vaccine? Yes   Zostavax completed No   Shingrix Completed?: No.    Education has been provided regarding the importance of this vaccine. Patient has been advised to call insurance company to determine out of pocket expense if they have not yet received this vaccine. Advised may also receive vaccine at local pharmacy or Health Dept. Verbalized acceptance and understanding.  Screening Tests Health Maintenance  Topic Date Due   DTaP/Tdap/Td (1 - Tdap) Never done   Pneumonia Vaccine 22+ Years old (2 of 2 - PCV) 09/24/2021   Medicare Annual Wellness (AWV)  06/18/2024   Colonoscopy  03/02/2032   Hepatitis C Screening  Completed   HIV Screening  Completed   HPV VACCINES  Aged Out   INFLUENZA VACCINE  Discontinued   COVID-19 Vaccine  Discontinued   Zoster Vaccines- Shingrix  Discontinued    Health Maintenance  Health Maintenance Due  Topic Date Due   DTaP/Tdap/Td (1 - Tdap) Never done   Pneumonia Vaccine 41+ Years old (2 of 2 - PCV) 09/24/2021    Colorectal cancer screening: Type of screening: Colonoscopy. Completed 03/02/2022. Repeat every 10 years  Lung Cancer Screening: (Low Dose CT Chest recommended if Age 22-80 years, 20 pack-year currently smoking OR have quit w/in 15years.) does not qualify.   Lung Cancer Screening Referral: N/A  Additional Screening:  Hepatitis C Screening: does qualify;  Completed 09/29/2015  Vision Screening: Recommended annual ophthalmology exams for early detection of glaucoma and other disorders of the eye. Is  the patient up to date with their annual eye exam?  Yes  Who is the provider or what is the name of the office in which the patient attends annual eye exams? Nicolette Bang on [address] in [place] ([facility]) If pt is not established with a provider, would they like to be referred to a provider to establish care? Yes .   Dental Screening: Recommended annual dental exams for proper oral hygiene    Community Resource Referral / Chronic Care Management: CRR required this visit?  No   CCM required this visit?  No     Plan:     I have personally reviewed and noted the following in the patient's chart:   Medical and social history Use of alcohol, tobacco or illicit drugs  Current medications and supplements including opioid prescriptions. Patient is not currently taking opioid prescriptions. Functional ability and status Nutritional status Physical activity Advanced directives List of other physicians Hospitalizations, surgeries, and ER visits in previous 12 months Vitals Screenings to include cognitive, depression, and falls Referrals and appointments  In addition, I have reviewed and discussed with patient certain preventive protocols, quality metrics, and best practice recommendations. A written personalized care plan for preventive services as well as general preventive health recommendations were provided to patient.     Ilse Billman L Shawnetta Lein, CMA   06/19/2023   After Visit Summary: (MyChart) Due to this being a telephonic visit, the after visit summary with patients personalized plan was offered to patient via MyChart   Nurse Notes: Patient is due for a Tdap,a dn Pneumonia vaccines, however patient stated that he had the Pneumonia vaccine last year.  I have researched in [facility] but unable to find patient's record.  Patient had no other concerns to address today.   Medical screening examination/treatment/procedure(s) were performed by non-physician  practitioner and as supervising physician I was immediately available for consultation/collaboration.  I agree with above. Jacinta Shoe, MD

## 2023-07-04 ENCOUNTER — Other Ambulatory Visit: Payer: Self-pay | Admitting: Internal Medicine

## 2023-07-05 DIAGNOSIS — M179 Osteoarthritis of knee, unspecified: Secondary | ICD-10-CM | POA: Insufficient documentation

## 2023-07-05 DIAGNOSIS — M170 Bilateral primary osteoarthritis of knee: Secondary | ICD-10-CM | POA: Diagnosis not present

## 2023-07-05 DIAGNOSIS — M25561 Pain in right knee: Secondary | ICD-10-CM | POA: Diagnosis not present

## 2023-09-07 ENCOUNTER — Ambulatory Visit: Payer: Medicare HMO | Admitting: Internal Medicine

## 2023-09-07 ENCOUNTER — Encounter: Payer: Self-pay | Admitting: Internal Medicine

## 2023-09-07 VITALS — BP 130/70 | Temp 98.6°F | Ht 72.0 in | Wt 168.0 lb

## 2023-09-07 DIAGNOSIS — E519 Thiamine deficiency, unspecified: Secondary | ICD-10-CM

## 2023-09-07 DIAGNOSIS — D649 Anemia, unspecified: Secondary | ICD-10-CM

## 2023-09-07 DIAGNOSIS — I10 Essential (primary) hypertension: Secondary | ICD-10-CM | POA: Diagnosis not present

## 2023-09-07 DIAGNOSIS — E559 Vitamin D deficiency, unspecified: Secondary | ICD-10-CM | POA: Diagnosis not present

## 2023-09-07 DIAGNOSIS — Z566 Other physical and mental strain related to work: Secondary | ICD-10-CM

## 2023-09-07 DIAGNOSIS — N390 Urinary tract infection, site not specified: Secondary | ICD-10-CM

## 2023-09-07 DIAGNOSIS — E538 Deficiency of other specified B group vitamins: Secondary | ICD-10-CM

## 2023-09-07 DIAGNOSIS — L853 Xerosis cutis: Secondary | ICD-10-CM | POA: Diagnosis not present

## 2023-09-07 DIAGNOSIS — E60 Dietary zinc deficiency: Secondary | ICD-10-CM | POA: Diagnosis not present

## 2023-09-07 LAB — CBC WITH DIFFERENTIAL/PLATELET
Basophils Absolute: 0 10*3/uL (ref 0.0–0.1)
Basophils Relative: 0.5 % (ref 0.0–3.0)
Eosinophils Absolute: 0.2 10*3/uL (ref 0.0–0.7)
Eosinophils Relative: 2.7 % (ref 0.0–5.0)
HCT: 31.1 % — ABNORMAL LOW (ref 39.0–52.0)
Hemoglobin: 10.6 g/dL — ABNORMAL LOW (ref 13.0–17.0)
Lymphocytes Relative: 21.2 % (ref 12.0–46.0)
Lymphs Abs: 1.2 10*3/uL (ref 0.7–4.0)
MCHC: 34.1 g/dL (ref 30.0–36.0)
MCV: 94.1 fL (ref 78.0–100.0)
Monocytes Absolute: 0.4 10*3/uL (ref 0.1–1.0)
Monocytes Relative: 7.5 % (ref 3.0–12.0)
Neutro Abs: 3.8 10*3/uL (ref 1.4–7.7)
Neutrophils Relative %: 68.1 % (ref 43.0–77.0)
Platelets: 105 10*3/uL — ABNORMAL LOW (ref 150.0–400.0)
RBC: 3.3 Mil/uL — ABNORMAL LOW (ref 4.22–5.81)
RDW: 16 % — ABNORMAL HIGH (ref 11.5–15.5)
WBC: 5.5 10*3/uL (ref 4.0–10.5)

## 2023-09-07 LAB — URINALYSIS, ROUTINE W REFLEX MICROSCOPIC
Bilirubin Urine: NEGATIVE
Ketones, ur: NEGATIVE
Nitrite: NEGATIVE
Specific Gravity, Urine: 1.02 (ref 1.000–1.030)
Total Protein, Urine: 30 — AB
Urine Glucose: NEGATIVE
Urobilinogen, UA: 1 (ref 0.0–1.0)
pH: 6 (ref 5.0–8.0)

## 2023-09-07 LAB — COMPREHENSIVE METABOLIC PANEL
ALT: 16 U/L (ref 0–53)
AST: 28 U/L (ref 0–37)
Albumin: 4.1 g/dL (ref 3.5–5.2)
Alkaline Phosphatase: 139 U/L — ABNORMAL HIGH (ref 39–117)
BUN: 28 mg/dL — ABNORMAL HIGH (ref 6–23)
CO2: 22 meq/L (ref 19–32)
Calcium: 9.2 mg/dL (ref 8.4–10.5)
Chloride: 109 meq/L (ref 96–112)
Creatinine, Ser: 1.78 mg/dL — ABNORMAL HIGH (ref 0.40–1.50)
GFR: 39.59 mL/min — ABNORMAL LOW (ref >60.00)
Glucose, Bld: 106 mg/dL — ABNORMAL HIGH (ref 70–99)
Potassium: 3.6 meq/L (ref 3.5–5.1)
Sodium: 142 meq/L (ref 135–145)
Total Bilirubin: 1.1 mg/dL (ref 0.2–1.2)
Total Protein: 8.4 g/dL — ABNORMAL HIGH (ref 6.0–8.3)

## 2023-09-07 LAB — VITAMIN B12: Vitamin B-12: 495 pg/mL (ref 211–911)

## 2023-09-07 LAB — VITAMIN D 25 HYDROXY (VIT D DEFICIENCY, FRACTURES): VITD: 64.68 ng/mL (ref 30.00–100.00)

## 2023-09-07 MED ORDER — PROMETHAZINE HCL 25 MG PO TABS: ORAL_TABLET | ORAL | 2 refills | Status: DC

## 2023-09-07 MED ORDER — PANTOPRAZOLE SODIUM 40 MG PO TBEC: 40.0000 mg | DELAYED_RELEASE_TABLET | Freq: Every day | ORAL | 3 refills | Status: AC

## 2023-09-07 MED ORDER — TRIAMCINOLONE ACETONIDE 0.5 % EX CREA: TOPICAL_CREAM | Freq: Three times a day (TID) | CUTANEOUS | 2 refills | Status: DC

## 2023-09-07 MED ORDER — ZINC 100 MG PO TABS: ORAL_TABLET | ORAL | 3 refills | Status: AC

## 2023-09-07 MED ORDER — IRON (FERROUS SULFATE) 325 (65 FE) MG PO TABS: 325.0000 mg | ORAL_TABLET | Freq: Every day | ORAL | 3 refills | Status: AC

## 2023-09-07 NOTE — Assessment & Plan Note (Signed)
On NAS diet  

## 2023-09-07 NOTE — Assessment & Plan Note (Signed)
Use Triamc in Eucerin lotion Low zinc level - take zinc

## 2023-09-07 NOTE — Assessment & Plan Note (Signed)
wife is sick in the ICU - discussed w/pt

## 2023-09-07 NOTE — Assessment & Plan Note (Signed)
On Vit D Risks associated with treatment noncompliance were discussed. Compliance was encouraged.  

## 2023-09-07 NOTE — Progress Notes (Signed)
Subjective:  Patient ID: Johnny Mcknight, male    DOB: August 11, 1956  Age: 67 y.o. MRN: [id number]  CC: Medical Management of Chronic Issues (3 mnth f/u)   HPI Kollin Raab presents for dermatitis, zinc deficiency, GERD  Outpatient Medications Prior to Visit  Medication Sig Dispense Refill   B Complex-Folic Acid (B COMPLEX PLUS) TABS Take 1 tablet by mouth daily. 100 tablet 3   Cholecalciferol (VITAMIN D3) 50 MCG (2000 UT) capsule Take 1 capsule (2,000 Units total) by mouth daily. 100 capsule 3   hydrocortisone (ANUSOL-HC) 2.5 % rectal cream Place rectally 2 (two) times daily. (Patient taking differently: Place 1 application  rectally 2 (two) times daily as needed for hemorrhoids.) 30 g 0   meloxicam (MOBIC) 15 MG tablet Take 15 mg by mouth daily.     Iron, Ferrous Sulfate, 325 (65 Fe) MG TABS Take 325 mg by mouth daily. 30 tablet 5   pantoprazole (PROTONIX) 40 MG tablet Take 1 tablet (40 mg total) by mouth daily. 90 tablet 3   promethazine (PHENERGAN) 25 MG tablet TAKE 1 TABLET BY MOUTH EVERY 8 HOURS AS NEEDED FOR NAUSEA FOR VOMITING OR  USE  FOR  HICCUPS 60 tablet 2   triamcinolone cream (KENALOG) 0.5 % APPLY 1 APPLICATION TOPICALLY 3 TIMES PER DAY 90 g 0   Zinc 100 MG TABS 1 po qd 100 tablet 3   No facility-administered medications prior to visit.    ROS: Review of Systems  Constitutional:  Negative for appetite change, fatigue and unexpected weight change.  HENT:  Negative for congestion, nosebleeds, sneezing, sore throat and trouble swallowing.   Eyes:  Negative for itching and visual disturbance.  Respiratory:  Negative for cough.   Cardiovascular:  Negative for chest pain, palpitations and leg swelling.  Gastrointestinal:  Negative for abdominal distention, blood in stool, diarrhea and nausea.  Genitourinary:  Negative for frequency and hematuria.  Musculoskeletal:  Negative for back pain, gait problem, joint swelling and neck pain.  Skin:  Positive for color change and rash.   Neurological:  Negative for dizziness, tremors, speech difficulty and weakness.  Psychiatric/Behavioral:  Negative for agitation, dysphoric mood and sleep disturbance. The patient is not nervous/anxious.     Objective:  BP 130/70 (BP Location: Left Arm, Patient Position: Sitting, Cuff Size: Normal)   Temp 98.6 F (37 C) (Oral)   Ht 6' (1.829 m)   Wt 168 lb (76.2 kg)   SpO2 97%   BMI 22.78 kg/m   BP Readings from Last 3 Encounters:  09/07/23 130/70  06/08/23 120/68  04/12/23 114/68    Wt Readings from Last 3 Encounters:  09/07/23 168 lb (76.2 kg)  06/19/23 166 lb (75.3 kg)  06/08/23 166 lb (75.3 kg)    Physical Exam Constitutional:      Appearance: Normal appearance.  Skin:    Findings: Rash present.  Neurological:     Coordination: Coordination abnormal.     Gait: Gait normal.   Scaly skin  Lab Results  Component Value Date   WBC 4.2 06/08/2023   HGB 9.7 (L) 06/08/2023   HCT 29.3 (L) 06/08/2023   PLT 94.0 (L) 06/08/2023   GLUCOSE 107 (H) 06/08/2023   CHOL 157 08/20/2018   TRIG 79.0 08/20/2018   HDL 84.20 08/20/2018   LDLCALC 57 08/20/2018   ALT 16 06/08/2023   AST 28 06/08/2023   NA 143 06/08/2023   K 4.4 06/08/2023   CL 112 06/08/2023   CREATININE 1.65 (H) 06/08/2023  BUN 19 06/08/2023   CO2 24 06/08/2023   TSH 2.43 03/24/2020   PSA 1.76 08/20/2018   INR 1.2 02/16/2022   HGBA1C 4.6 04/03/2018    MR Abdomen W Wo Contrast Result Date: 03/27/2022 CLINICAL DATA:  Abnormal CT, cirrhosis EXAM: MRI ABDOMEN WITHOUT AND WITH CONTRAST TECHNIQUE: Multiplanar multisequence MR imaging of the abdomen was performed both before and after the administration of intravenous contrast. CONTRAST:  14mL MULTIHANCE GADOBENATE DIMEGLUMINE 529 MG/ML IV SOLN COMPARISON:  CT abdomen 02/18/2022 FINDINGS: Lower chest: No acute findings. Hepatobiliary: Liver is nodular with diffuse heterogeneous parenchyma, consistent with cirrhosis. Diffusely inhomogeneous appearance of the  parenchyma on postcontrast sequences with no focal arterially enhancing/delayed washout mass identified. There is a subcentimeter cyst in the inferior right lobe segment 4/5. Gallbladder is nearly filled with small stones. No wall thickening or pericholecystic edema. No biliary ductal dilatation. Pancreas: No mass, inflammatory changes, or other parenchymal abnormality identified. Spleen:  Enlarged measuring up to 13.2 cm in length. Adrenals/Urinary Tract: Adrenal glands appear normal. Small cyst in the lower pole left kidney. No hydronephrosis. Stomach/Bowel: Colonic diverticulosis. Vascular/Lymphatic: No pathologically enlarged lymph nodes identified. 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 No abdominal aortic aneurysm demonstrated. Other:  No ascites. Musculoskeletal: No suspicious bone lesions identified. IMPRESSION: 1. Hepatic cirrhosis with no suspicious mass identified. Small hepatic cyst. 2. 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 3. Colonic diverticulosis. Electronically Signed   By: Jannifer Hick M.D.   On: 03/27/2022 18:24    Assessment & Plan:   Problem List Items Addressed This Visit     Stress at work   wife is sick in the ICU - discussed w/pt      B12 deficiency - Primary   On B12      Relevant Orders   Urinalysis   Iron, TIBC and Ferritin Panel   Comprehensive metabolic panel   CBC with Differential/Platelet   Vitamin B12   VITAMIN D 25 Hydroxy (Vit-D Deficiency, Fractures)   Vitamin D deficiency   On Vit D Risks associated with treatment noncompliance were discussed. Compliance was encouraged.      Relevant Orders   Urinalysis   Iron, TIBC and Ferritin Panel   Comprehensive metabolic panel   CBC with Differential/Platelet   Vitamin B12   VITAMIN D 25 Hydroxy (Vit-D Deficiency, Fractures)   Dry skin   Use Triamc in Eucerin lotion Low zinc level - take zinc      HTN (hypertension)   On  NAS diet      Relevant Orders   Urinalysis   Iron, TIBC and Ferritin Panel   Comprehensive metabolic panel   CBC with Differential/Platelet   Vitamin B12   VITAMIN D 25 Hydroxy (Vit-D Deficiency, Fractures)   Zinc deficiency    Taking Zinc every day, not bid: take bid      Relevant Orders   Zinc   Thiamine deficiency   On B complex Risks associated with treatment noncompliance were discussed. Compliance was encouraged.      Anemia   Check CBC, iron  Recurrent - h/o AVMs Last EGD 2023 Restart PPI GI f/u      Relevant Medications   Iron, Ferrous Sulfate, 325 (65 Fe) MG TABS   Other Relevant Orders   Urinalysis   Iron, TIBC and Ferritin Panel   Comprehensive metabolic panel   CBC with Differential/Platelet   Vitamin B12   VITAMIN D 25 Hydroxy (Vit-D Deficiency, Fractures)  Meds ordered this encounter  Medications   Iron, Ferrous Sulfate, 325 (65 Fe) MG TABS    Sig: Take 325 mg by mouth daily.    Dispense:  90 tablet    Refill:  3   pantoprazole (PROTONIX) 40 MG tablet    Sig: Take 1 tablet (40 mg total) by mouth daily.    Dispense:  90 tablet    Refill:  3   promethazine (PHENERGAN) 25 MG tablet    Sig: TAKE 1 TABLET BY MOUTH EVERY 8 HOURS AS NEEDED FOR NAUSEA FOR VOMITING OR  USE  FOR  HICCUPS    Dispense:  60 tablet    Refill:  2   triamcinolone cream (KENALOG) 0.5 %    Sig: Apply topically 3 (three) times daily.    Dispense:  90 g    Refill:  2   Zinc 100 MG TABS    Sig: 1 po qd    Dispense:  100 tablet    Refill:  3      Follow-up: No follow-ups on file.  Sonda Primes, MD

## 2023-09-07 NOTE — Assessment & Plan Note (Signed)
  Taking Zinc every day, not bid: take bid

## 2023-09-07 NOTE — Assessment & Plan Note (Signed)
Check CBC, iron  Recurrent - h/o AVMs Last EGD 2023 Restart PPI GI f/u

## 2023-09-07 NOTE — Assessment & Plan Note (Signed)
On B12 

## 2023-09-07 NOTE — Assessment & Plan Note (Signed)
On B complex Risks associated with treatment noncompliance were discussed. Compliance was encouraged.

## 2023-09-08 DIAGNOSIS — N390 Urinary tract infection, site not specified: Secondary | ICD-10-CM | POA: Insufficient documentation

## 2023-09-08 MED ORDER — CIPROFLOXACIN HCL 500 MG PO TABS: 500.0000 mg | ORAL_TABLET | Freq: Two times a day (BID) | ORAL | 0 refills | Status: AC

## 2023-09-08 NOTE — Assessment & Plan Note (Signed)
UTI versus prostatitis.  Prescribed Cipro

## 2023-09-08 NOTE — Addendum Note (Signed)
Addended by: Tresa Garter on: 09/08/2023 02:20 PM   Modules accepted: Orders

## 2023-09-13 LAB — ZINC: Zinc: 66 ug/dL (ref 60–130)

## 2023-09-13 LAB — IRON,TIBC AND FERRITIN PANEL
%SAT: 16 % — ABNORMAL LOW (ref 20–48)
Ferritin: 33 ng/mL (ref 24–380)
Iron: 57 ug/dL (ref 50–180)
TIBC: 360 ug/dL (ref 250–425)

## 2023-12-06 ENCOUNTER — Encounter: Payer: Self-pay | Admitting: Internal Medicine

## 2023-12-06 ENCOUNTER — Ambulatory Visit: Payer: Medicare HMO | Admitting: Internal Medicine

## 2023-12-06 VITALS — BP 120/70 | HR 87 | Temp 98.3°F | Ht 72.0 in | Wt 168.0 lb

## 2023-12-06 DIAGNOSIS — K7030 Alcoholic cirrhosis of liver without ascites: Secondary | ICD-10-CM | POA: Diagnosis not present

## 2023-12-06 DIAGNOSIS — K7460 Unspecified cirrhosis of liver: Secondary | ICD-10-CM | POA: Insufficient documentation

## 2023-12-06 DIAGNOSIS — K7010 Alcoholic hepatitis without ascites: Secondary | ICD-10-CM | POA: Diagnosis not present

## 2023-12-06 DIAGNOSIS — E559 Vitamin D deficiency, unspecified: Secondary | ICD-10-CM

## 2023-12-06 DIAGNOSIS — N390 Urinary tract infection, site not specified: Secondary | ICD-10-CM

## 2023-12-06 DIAGNOSIS — M25562 Pain in left knee: Secondary | ICD-10-CM | POA: Diagnosis not present

## 2023-12-06 DIAGNOSIS — G8929 Other chronic pain: Secondary | ICD-10-CM

## 2023-12-06 DIAGNOSIS — M25561 Pain in right knee: Secondary | ICD-10-CM

## 2023-12-06 DIAGNOSIS — E538 Deficiency of other specified B group vitamins: Secondary | ICD-10-CM

## 2023-12-06 DIAGNOSIS — E60 Dietary zinc deficiency: Secondary | ICD-10-CM | POA: Diagnosis not present

## 2023-12-06 LAB — CBC WITH DIFFERENTIAL/PLATELET
Basophils Absolute: 0 10*3/uL (ref 0.0–0.1)
Basophils Relative: 0.6 % (ref 0.0–3.0)
Eosinophils Absolute: 0.1 10*3/uL (ref 0.0–0.7)
Eosinophils Relative: 3.4 % (ref 0.0–5.0)
HCT: 32.8 % — ABNORMAL LOW (ref 39.0–52.0)
Hemoglobin: 11.2 g/dL — ABNORMAL LOW (ref 13.0–17.0)
Lymphocytes Relative: 24 % (ref 12.0–46.0)
Lymphs Abs: 1 10*3/uL (ref 0.7–4.0)
MCHC: 34 g/dL (ref 30.0–36.0)
MCV: 91.8 fl (ref 78.0–100.0)
Monocytes Absolute: 0.4 10*3/uL (ref 0.1–1.0)
Monocytes Relative: 8.9 % (ref 3.0–12.0)
Neutro Abs: 2.6 10*3/uL (ref 1.4–7.7)
Neutrophils Relative %: 63.1 % (ref 43.0–77.0)
Platelets: 96 10*3/uL — ABNORMAL LOW (ref 150.0–400.0)
RBC: 3.58 Mil/uL — ABNORMAL LOW (ref 4.22–5.81)
RDW: 14.5 % (ref 11.5–15.5)
WBC: 4.1 10*3/uL (ref 4.0–10.5)

## 2023-12-06 LAB — URINALYSIS, ROUTINE W REFLEX MICROSCOPIC
Bilirubin Urine: NEGATIVE
Ketones, ur: NEGATIVE
Leukocytes,Ua: NEGATIVE
Nitrite: NEGATIVE
Specific Gravity, Urine: 1.02 (ref 1.000–1.030)
Total Protein, Urine: NEGATIVE
Urine Glucose: NEGATIVE
Urobilinogen, UA: 0.2 (ref 0.0–1.0)
pH: 6 (ref 5.0–8.0)

## 2023-12-06 LAB — COMPREHENSIVE METABOLIC PANEL
ALT: 18 U/L (ref 0–53)
AST: 33 U/L (ref 0–37)
Albumin: 4.1 g/dL (ref 3.5–5.2)
Alkaline Phosphatase: 147 U/L — ABNORMAL HIGH (ref 39–117)
BUN: 21 mg/dL (ref 6–23)
CO2: 25 meq/L (ref 19–32)
Calcium: 9.1 mg/dL (ref 8.4–10.5)
Chloride: 109 meq/L (ref 96–112)
Creatinine, Ser: 1.38 mg/dL (ref 0.40–1.50)
GFR: 53.64 mL/min — ABNORMAL LOW (ref >60.00)
Glucose, Bld: 105 mg/dL — ABNORMAL HIGH (ref 70–99)
Potassium: 3.6 meq/L (ref 3.5–5.1)
Sodium: 141 meq/L (ref 135–145)
Total Bilirubin: 1 mg/dL (ref 0.2–1.2)
Total Protein: 8 g/dL (ref 6.0–8.3)

## 2023-12-06 NOTE — Assessment & Plan Note (Signed)
 On Vit D

## 2023-12-06 NOTE — Progress Notes (Signed)
 Subjective:  Patient ID: Johnny Mcknight, male    DOB: Apr 28, 1956  Age: 68 y.o. MRN: [id number]  CC: Medical Management of Chronic Issues (3 mnth f/u)   HPI Johnny Mcknight presents for R knee pain (shots did not help), low zinc, B12 def  Outpatient Medications Prior to Visit  Medication Sig Dispense Refill   B Complex-Folic Acid (B COMPLEX PLUS) TABS Take 1 tablet by mouth daily. 100 tablet 3   Cholecalciferol (VITAMIN D3) 50 MCG (2000 UT) capsule Take 1 capsule (2,000 Units total) by mouth daily. 100 capsule 3   hydrocortisone (ANUSOL-HC) 2.5 % rectal cream Place rectally 2 (two) times daily. (Patient taking differently: Place 1 application  rectally 2 (two) times daily as needed for hemorrhoids.) 30 g 0   Iron, Ferrous Sulfate, 325 (65 Fe) MG TABS Take 325 mg by mouth daily. 90 tablet 3   meloxicam (MOBIC) 15 MG tablet Take 15 mg by mouth daily.     pantoprazole (PROTONIX) 40 MG tablet Take 1 tablet (40 mg total) by mouth daily. 90 tablet 3   promethazine (PHENERGAN) 25 MG tablet TAKE 1 TABLET BY MOUTH EVERY 8 HOURS AS NEEDED FOR NAUSEA FOR VOMITING OR  USE  FOR  HICCUPS 60 tablet 2   triamcinolone cream (KENALOG) 0.5 % Apply topically 3 (three) times daily. 90 g 2   Zinc 100 MG TABS 1 po qd 100 tablet 3   No facility-administered medications prior to visit.    ROS: Review of Systems  Constitutional:  Negative for appetite change, fatigue and unexpected weight change.  HENT:  Negative for congestion, nosebleeds, sneezing, sore throat and trouble swallowing.   Eyes:  Negative for itching and visual disturbance.  Respiratory:  Negative for cough.   Cardiovascular:  Negative for chest pain, palpitations and leg swelling.  Gastrointestinal:  Negative for abdominal distention, blood in stool, diarrhea and nausea.  Genitourinary:  Negative for frequency and hematuria.  Musculoskeletal:  Positive for arthralgias and gait problem. Negative for back pain, joint swelling and neck pain.  Skin:   Positive for color change. Negative for rash.  Neurological:  Negative for dizziness, tremors, speech difficulty and weakness.  Psychiatric/Behavioral:  Negative for agitation, dysphoric mood and sleep disturbance. The patient is not nervous/anxious.     Objective:  BP 120/70   Pulse 87   Temp 98.3 F (36.8 C) (Oral)   Ht 6' (1.829 m)   Wt 168 lb (76.2 kg)   SpO2 98%   BMI 22.78 kg/m   BP Readings from Last 3 Encounters:  12/06/23 120/70  09/07/23 130/70  06/08/23 120/68    Wt Readings from Last 3 Encounters:  12/06/23 168 lb (76.2 kg)  09/07/23 168 lb (76.2 kg)  06/19/23 166 lb (75.3 kg)    Physical Exam Constitutional:      General: He is not in acute distress.    Appearance: Normal appearance. He is well-developed.     Comments: NAD  Eyes:     Conjunctiva/sclera: Conjunctivae normal.     Pupils: Pupils are equal, round, and reactive to light.  Neck:     Thyroid: No thyromegaly.     Vascular: No JVD.  Cardiovascular:     Rate and Rhythm: Normal rate and regular rhythm.     Heart sounds: Normal heart sounds. No murmur heard.    No friction rub. No gallop.  Pulmonary:     Effort: Pulmonary effort is normal. No respiratory distress.     Breath sounds: Normal  breath sounds. No wheezing or rales.  Chest:     Chest wall: No tenderness.  Abdominal:     General: Bowel sounds are normal. There is no distension.     Palpations: Abdomen is soft. There is no mass.     Tenderness: There is no abdominal tenderness. There is no guarding or rebound.  Musculoskeletal:        General: No tenderness. Normal range of motion.     Cervical back: Normal range of motion.  Lymphadenopathy:     Cervical: No cervical adenopathy.  Skin:    General: Skin is warm and dry.     Findings: Rash present.  Neurological:     Mental Status: He is alert and oriented to person, place, and time.     Cranial Nerves: No cranial nerve deficit.     Motor: No abnormal muscle tone.      Coordination: Coordination normal.     Gait: Gait abnormal.     Deep Tendon Reflexes: Reflexes are normal and symmetric.  Psychiatric:        Behavior: Behavior normal.        Thought Content: Thought content normal.        Judgment: Judgment normal.   Scaly pigmented rash R knee w/pain Liver edge is palpable  Lab Results  Component Value Date   WBC 5.5 09/07/2023   HGB 10.6 (L) 09/07/2023   HCT 31.1 (L) 09/07/2023   PLT 105.0 (L) 09/07/2023   GLUCOSE 106 (H) 09/07/2023   CHOL 157 08/20/2018   TRIG 79.0 08/20/2018   HDL 84.20 08/20/2018   LDLCALC 57 08/20/2018   ALT 16 09/07/2023   AST 28 09/07/2023   NA 142 09/07/2023   K 3.6 09/07/2023   CL 109 09/07/2023   CREATININE 1.78 (H) 09/07/2023   BUN 28 (H) 09/07/2023   CO2 22 09/07/2023   TSH 2.43 03/24/2020   PSA 1.76 08/20/2018   INR 1.2 02/16/2022   HGBA1C 4.6 04/03/2018    MR Abdomen W Wo Contrast Result Date: 03/27/2022 CLINICAL DATA:  Abnormal CT, cirrhosis EXAM: MRI ABDOMEN WITHOUT AND WITH CONTRAST TECHNIQUE: Multiplanar multisequence MR imaging of the abdomen was performed both before and after the administration of intravenous contrast. CONTRAST:  14mL MULTIHANCE GADOBENATE DIMEGLUMINE 529 MG/ML IV SOLN COMPARISON:  CT abdomen 02/18/2022 FINDINGS: Lower chest: No acute findings. Hepatobiliary: Liver is nodular with diffuse heterogeneous parenchyma, consistent with cirrhosis. Diffusely inhomogeneous appearance of the parenchyma on postcontrast sequences with no focal arterially enhancing/delayed washout mass identified. There is a subcentimeter cyst in the inferior right lobe segment 4/5. Gallbladder is nearly filled with small stones. No wall thickening or pericholecystic edema. No biliary ductal dilatation. Pancreas: No mass, inflammatory changes, or other parenchymal abnormality identified. Spleen:  Enlarged measuring up to 13.2 cm in length. Adrenals/Urinary Tract: Adrenal glands appear normal. Small cyst in the lower  pole left kidney. No hydronephrosis. Stomach/Bowel: Colonic diverticulosis. Vascular/Lymphatic: No pathologically enlarged lymph nodes identified. 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 No abdominal aortic aneurysm demonstrated. Other:  No ascites. Musculoskeletal: No suspicious bone lesions identified. IMPRESSION: 1. Hepatic cirrhosis with no suspicious mass identified. Small hepatic cyst. 2. 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 3. Colonic diverticulosis. Electronically Signed   By: Jannifer Hick M.D.   On: 03/27/2022 18:24    Assessment & Plan:   Problem List Items Addressed This Visit     Hypomagnesemia   Labs, po magnesium  Knee pain, bilateral   OA R knee pain (shots did not help)      B12 deficiency   On B12      Vitamin D deficiency   On Vit D       Zinc deficiency   Zinc level was nl      Alcoholic hepatitis   Dr Terrilee Croak 2023 Abd MRI IMPRESSION: 1. Hepatic cirrhosis with no suspicious mass identified. Small hepatic cyst. 2. 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  Beer sometimes      Relevant Orders   Comprehensive metabolic panel   CBC with Differential/Platelet   AFP tumor marker   Urinary tract infection   Re-test UA      Relevant Orders   Urinalysis   Cirrhosis of liver (HCC) - Primary   Dr Terrilee Croak 2023 Abd MRI IMPRESSION: 1. Hepatic cirrhosis with no suspicious mass identified. Small hepatic cyst. 2. 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  Beer sometimes  Monitor labs, AFP      Relevant Orders   Comprehensive metabolic panel   CBC with Differential/Platelet   AFP tumor marker      No orders of the defined types were placed in this encounter.     Follow-up: Return in about 3 months (around 03/07/2024) for a follow-up visit.  Sonda Primes, MD

## 2023-12-06 NOTE — Assessment & Plan Note (Addendum)
 Dr Terrilee Croak 2023 Abd MRI IMPRESSION: 1. Hepatic cirrhosis with no suspicious mass identified. Small hepatic cyst. 2. 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  Beer sometimes

## 2023-12-06 NOTE — Assessment & Plan Note (Signed)
 Dr Terrilee Croak 2023 Abd MRI IMPRESSION: 1. Hepatic cirrhosis with no suspicious mass identified. Small hepatic cyst. 2. 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  Beer sometimes  Monitor labs, AFP

## 2023-12-06 NOTE — Assessment & Plan Note (Signed)
 On B12

## 2023-12-06 NOTE — Assessment & Plan Note (Signed)
 OA R knee pain (shots did not help)

## 2023-12-06 NOTE — Assessment & Plan Note (Signed)
 Labs, po magnesium

## 2023-12-06 NOTE — Patient Instructions (Signed)
 Johnny Mcknight

## 2023-12-06 NOTE — Assessment & Plan Note (Signed)
 Zinc level was nl

## 2023-12-06 NOTE — Assessment & Plan Note (Signed)
 Re-test UA

## 2023-12-07 LAB — AFP TUMOR MARKER: AFP-Tumor Marker: 1.5 ng/mL (ref <6.1)

## 2023-12-10 ENCOUNTER — Encounter: Payer: Self-pay | Admitting: Internal Medicine

## 2024-03-06 DIAGNOSIS — H5203 Hypermetropia, bilateral: Secondary | ICD-10-CM | POA: Diagnosis not present

## 2024-03-06 DIAGNOSIS — H524 Presbyopia: Secondary | ICD-10-CM | POA: Diagnosis not present

## 2024-03-07 ENCOUNTER — Ambulatory Visit: Admitting: Internal Medicine

## 2024-03-07 ENCOUNTER — Encounter: Payer: Self-pay | Admitting: Internal Medicine

## 2024-03-07 VITALS — BP 130/72 | HR 81 | Temp 98.1°F | Ht 72.0 in | Wt 178.0 lb

## 2024-03-07 DIAGNOSIS — E538 Deficiency of other specified B group vitamins: Secondary | ICD-10-CM | POA: Diagnosis not present

## 2024-03-07 DIAGNOSIS — K7030 Alcoholic cirrhosis of liver without ascites: Secondary | ICD-10-CM

## 2024-03-07 DIAGNOSIS — E60 Dietary zinc deficiency: Secondary | ICD-10-CM | POA: Diagnosis not present

## 2024-03-07 DIAGNOSIS — E519 Thiamine deficiency, unspecified: Secondary | ICD-10-CM

## 2024-03-07 DIAGNOSIS — R110 Nausea: Secondary | ICD-10-CM | POA: Diagnosis not present

## 2024-03-07 DIAGNOSIS — K219 Gastro-esophageal reflux disease without esophagitis: Secondary | ICD-10-CM

## 2024-03-07 MED ORDER — TRIAMCINOLONE ACETONIDE 0.5 % EX CREA: TOPICAL_CREAM | Freq: Three times a day (TID) | CUTANEOUS | 3 refills | Status: AC

## 2024-03-07 MED ORDER — PROMETHAZINE HCL 25 MG PO TABS: ORAL_TABLET | ORAL | 2 refills | Status: AC

## 2024-03-07 NOTE — Assessment & Plan Note (Signed)
 Zinc level was nl

## 2024-03-07 NOTE — Assessment & Plan Note (Signed)
 On B complex Risks associated with treatment noncompliance were discussed. Compliance was encouraged.

## 2024-03-07 NOTE — Assessment & Plan Note (Signed)
 On Protonix

## 2024-03-07 NOTE — Assessment & Plan Note (Signed)
 Pt gained 10 lbs (using meals on wheels) Pepcid  prn.  Promethazine  as needed.

## 2024-03-07 NOTE — Assessment & Plan Note (Signed)
 On B12

## 2024-03-07 NOTE — Patient Instructions (Signed)
 Johnny Mcknight

## 2024-03-07 NOTE — Progress Notes (Signed)
 Subjective:  Patient ID: Johnny Mcknight, male    DOB: October 07, 1955  Age: 68 y.o. MRN: [id number]  CC: Medical Management of Chronic Issues (3 mnth f/u)   HPI Johnny Mcknight presents for nausea, liver disease, rash - gained 10 lbs (using meals on wheels)  Outpatient Medications Prior to Visit  Medication Sig Dispense Refill   B Complex-Folic Acid  (B COMPLEX PLUS) TABS Take 1 tablet by mouth daily. 100 tablet 3   Cholecalciferol (VITAMIN D3) 50 MCG (2000 UT) capsule Take 1 capsule (2,000 Units total) by mouth daily. 100 capsule 3   hydrocortisone  (ANUSOL -HC) 2.5 % rectal cream Place rectally 2 (two) times daily. (Patient taking differently: Place 1 application  rectally 2 (two) times daily as needed for hemorrhoids.) 30 g 0   Iron , Ferrous Sulfate , 325 (65 Fe) MG TABS Take 325 mg by mouth daily. 90 tablet 3   meloxicam  (MOBIC ) 15 MG tablet Take 15 mg by mouth daily.     pantoprazole  (PROTONIX ) 40 MG tablet Take 1 tablet (40 mg total) by mouth daily. 90 tablet 3   Zinc  100 MG TABS 1 po qd 100 tablet 3   promethazine  (PHENERGAN ) 25 MG tablet TAKE 1 TABLET BY MOUTH EVERY 8 HOURS AS NEEDED FOR NAUSEA FOR VOMITING OR  USE  FOR  HICCUPS 60 tablet 2   triamcinolone  cream (KENALOG ) 0.5 % Apply topically 3 (three) times daily. 90 g 2   No facility-administered medications prior to visit.    ROS: Review of Systems  Constitutional:  Negative for appetite change, fatigue and unexpected weight change.  HENT:  Negative for congestion, nosebleeds, sneezing, sore throat and trouble swallowing.   Eyes:  Negative for itching and visual disturbance.  Respiratory:  Negative for cough.   Cardiovascular:  Negative for chest pain, palpitations and leg swelling.  Gastrointestinal:  Negative for abdominal distention, blood in stool, diarrhea and nausea.  Genitourinary:  Negative for frequency and hematuria.  Musculoskeletal:  Positive for gait problem. Negative for back pain, joint swelling and neck pain.  Skin:   Positive for color change and rash.  Neurological:  Negative for dizziness, tremors, speech difficulty and weakness.  Psychiatric/Behavioral:  Negative for agitation, dysphoric mood and sleep disturbance. The patient is not nervous/anxious.     Objective:  BP 130/72   Pulse 81   Temp 98.1 F (36.7 C) (Oral)   Ht 6' (1.829 m)   Wt 178 lb (80.7 kg)   SpO2 98%   BMI 24.14 kg/m   BP Readings from Last 3 Encounters:  03/07/24 130/72  12/06/23 120/70  09/07/23 130/70    Wt Readings from Last 3 Encounters:  03/07/24 178 lb (80.7 kg)  12/06/23 168 lb (76.2 kg)  09/07/23 168 lb (76.2 kg)    Physical Exam Constitutional:      General: He is not in acute distress.    Appearance: Normal appearance. He is well-developed.     Comments: NAD   Eyes:     Conjunctiva/sclera: Conjunctivae normal.     Pupils: Pupils are equal, round, and reactive to light.   Neck:     Thyroid : No thyromegaly.     Vascular: No JVD.   Cardiovascular:     Rate and Rhythm: Normal rate and regular rhythm.     Heart sounds: Normal heart sounds. No murmur heard.    No friction rub. No gallop.  Pulmonary:     Effort: Pulmonary effort is normal. No respiratory distress.     Breath sounds:  Normal breath sounds. No wheezing or rales.  Chest:     Chest wall: No tenderness.  Abdominal:     General: Bowel sounds are normal. There is no distension.     Palpations: Abdomen is soft. There is no mass.     Tenderness: There is no abdominal tenderness. There is no guarding or rebound.   Musculoskeletal:        General: No tenderness. Normal range of motion.     Cervical back: Normal range of motion.  Lymphadenopathy:     Cervical: No cervical adenopathy.   Skin:    General: Skin is warm and dry.     Findings: No rash.   Neurological:     Mental Status: He is alert and oriented to person, place, and time.     Cranial Nerves: No cranial nerve deficit.     Motor: No abnormal muscle tone.     Coordination:  Coordination normal.     Gait: Gait normal.     Deep Tendon Reflexes: Reflexes are normal and symmetric.   Psychiatric:        Behavior: Behavior normal.        Thought Content: Thought content normal.        Judgment: Judgment normal.     Lab Results  Component Value Date   WBC 4.1 12/06/2023   HGB 11.2 (L) 12/06/2023   HCT 32.8 (L) 12/06/2023   PLT 96.0 (L) 12/06/2023   GLUCOSE 105 (H) 12/06/2023   CHOL 157 08/20/2018   TRIG 79.0 08/20/2018   HDL 84.20 08/20/2018   LDLCALC 57 08/20/2018   ALT 18 12/06/2023   AST 33 12/06/2023   NA 141 12/06/2023   K 3.6 12/06/2023   CL 109 12/06/2023   CREATININE 1.38 12/06/2023   BUN 21 12/06/2023   CO2 25 12/06/2023   TSH 2.43 03/24/2020   PSA 1.76 08/20/2018   INR 1.2 02/16/2022   HGBA1C 4.6 04/03/2018    MR Abdomen W Wo Contrast Result Date: 03/27/2022 CLINICAL DATA:  Abnormal CT, cirrhosis EXAM: MRI ABDOMEN WITHOUT AND WITH CONTRAST TECHNIQUE: Multiplanar multisequence MR imaging of the abdomen was performed both before and after the administration of intravenous contrast. CONTRAST:  14mL MULTIHANCE  GADOBENATE DIMEGLUMINE  529 MG/ML IV SOLN COMPARISON:  CT abdomen 02/18/2022 FINDINGS: Lower chest: No acute findings. Hepatobiliary: Liver is nodular with diffuse heterogeneous parenchyma, consistent with cirrhosis. Diffusely inhomogeneous appearance of the parenchyma on postcontrast sequences with no focal arterially enhancing/delayed washout mass identified. There is a subcentimeter cyst in the inferior right lobe segment 4/5. Gallbladder is nearly filled with small stones. No wall thickening or pericholecystic edema. No biliary ductal dilatation. Pancreas: No mass, inflammatory changes, or other parenchymal abnormality identified. Spleen:  Enlarged measuring up to 13.2 cm in length. Adrenals/Urinary Tract: Adrenal glands appear normal. Small cyst in the lower pole left kidney. No hydronephrosis. Stomach/Bowel: Colonic diverticulosis.  Vascular/Lymphatic: No pathologically enlarged lymph nodes identified. 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 No abdominal aortic aneurysm demonstrated. Other:  No ascites. Musculoskeletal: No suspicious bone lesions identified. IMPRESSION: 1. Hepatic cirrhosis with no suspicious mass identified. Small hepatic cyst. 2. 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 3. Colonic diverticulosis. Electronically Signed   By: Armond Lands M.D.   On: 03/27/2022 18:24    Assessment & Plan:   Problem List Items Addressed This Visit     GERD   On Protonix       B12 deficiency  On B12      Zinc  deficiency - Primary   Zinc  level was nl      Nausea   Pt gained 10 lbs (using meals on wheels) Pepcid  prn.  Promethazine  as needed.      Thiamine  deficiency   On B complex Risks associated with treatment noncompliance were discussed. Compliance was encouraged.      Liver cirrhosis, alcoholic (HCC)   Monitor AFP, US          Meds ordered this encounter  Medications   promethazine  (PHENERGAN ) 25 MG tablet    Sig: TAKE 1 TABLET BY MOUTH EVERY 8 HOURS AS NEEDED FOR NAUSEA FOR VOMITING OR  USE  FOR  HICCUPS    Dispense:  60 tablet    Refill:  2   triamcinolone  cream (KENALOG ) 0.5 %    Sig: Apply topically 3 (three) times daily.    Dispense:  90 g    Refill:  3      Follow-up: Return in about 3 months (around 06/07/2024) for a follow-up visit.  Anitra Barn, MD

## 2024-03-07 NOTE — Assessment & Plan Note (Signed)
Monitor AFP, [place]

## 2024-03-07 NOTE — Assessment & Plan Note (Signed)
 Dr Deidre Fat sometimes  Monitor labs, AFP

## 2024-06-10 ENCOUNTER — Ambulatory Visit: Admitting: Internal Medicine

## 2024-06-10 ENCOUNTER — Encounter: Payer: Self-pay | Admitting: Internal Medicine

## 2024-06-10 VITALS — BP 130/82 | HR 79 | Temp 98.1°F | Ht 72.0 in | Wt 171.6 lb

## 2024-06-10 DIAGNOSIS — S80861A Insect bite (nonvenomous), right lower leg, initial encounter: Secondary | ICD-10-CM

## 2024-06-10 DIAGNOSIS — K7030 Alcoholic cirrhosis of liver without ascites: Secondary | ICD-10-CM

## 2024-06-10 DIAGNOSIS — E538 Deficiency of other specified B group vitamins: Secondary | ICD-10-CM

## 2024-06-10 DIAGNOSIS — W57XXXA Bitten or stung by nonvenomous insect and other nonvenomous arthropods, initial encounter: Secondary | ICD-10-CM

## 2024-06-10 NOTE — Assessment & Plan Note (Signed)
 R lower lat leg ?insect bite 2 days ago: better. Dressed w/abx oint/bandaid Keflex po if worse

## 2024-06-10 NOTE — Progress Notes (Signed)
 Subjective:  Patient ID: Johnny Mcknight, male    DOB: [birth year]  Age: 68 y.o. MRN: [id number]  CC: Follow-up (Patient states a spot on right leg. Noticed couple days ago. When first noticed it was causing pain. Currently not causing pain. )   HPI Johnny Mcknight presents for R lower lat leg ?insect bite 2 days ago: painful. He had a single mark, in a day - the second one appeared  Outpatient Medications Prior to Visit  Medication Sig Dispense Refill   B Complex-Folic Acid  (B COMPLEX PLUS) TABS Take 1 tablet by mouth daily. 100 tablet 3   Cholecalciferol (VITAMIN D3) 50 MCG (2000 UT) capsule Take 1 capsule (2,000 Units total) by mouth daily. 100 capsule 3   hydrocortisone  (ANUSOL -HC) 2.5 % rectal cream Place rectally 2 (two) times daily. 30 g 0   Iron , Ferrous Sulfate , 325 (65 Fe) MG TABS Take 325 mg by mouth daily. 90 tablet 3   meloxicam  (MOBIC ) 15 MG tablet Take 15 mg by mouth daily.     pantoprazole  (PROTONIX ) 40 MG tablet Take 1 tablet (40 mg total) by mouth daily. 90 tablet 3   promethazine  (PHENERGAN ) 25 MG tablet TAKE 1 TABLET BY MOUTH EVERY 8 HOURS AS NEEDED FOR NAUSEA FOR VOMITING OR  USE  FOR  HICCUPS 60 tablet 2   triamcinolone  cream (KENALOG ) 0.5 % Apply topically 3 (three) times daily. 90 g 3   Zinc  100 MG TABS 1 po qd 100 tablet 3   No facility-administered medications prior to visit.    ROS: Review of Systems  Constitutional:  Negative for chills, diaphoresis, fatigue and unexpected weight change.  Musculoskeletal:  Negative for arthralgias.  Neurological:  Positive for numbness. Negative for weakness.  Psychiatric/Behavioral:  The patient is not nervous/anxious.     Objective:  BP 130/82   Pulse 79   Temp 98.1 F (36.7 C) (Oral)   Ht 6' (1.829 m)   Wt 171 lb 9.6 oz (77.8 kg)   SpO2 99%   BMI 23.27 kg/m   BP Readings from Last 3 Encounters:  06/10/24 130/82  03/07/24 130/72  12/06/23 120/70    Wt Readings from Last 3 Encounters:  06/10/24 171 lb 9.6 oz (77.8  kg)  03/07/24 178 lb (80.7 kg)  12/06/23 168 lb (76.2 kg)    Physical Exam Constitutional:      General: He is not in acute distress.    Appearance: He is well-developed.     Comments: NAD  Eyes:     Conjunctiva/sclera: Conjunctivae normal.     Pupils: Pupils are equal, round, and reactive to light.  Neck:     Thyroid : No thyromegaly.     Vascular: No JVD.  Cardiovascular:     Rate and Rhythm: Normal rate and regular rhythm.     Heart sounds: Normal heart sounds. No murmur heard.    No friction rub. No gallop.  Pulmonary:     Effort: Pulmonary effort is normal. No respiratory distress.     Breath sounds: Normal breath sounds. No wheezing or rales.  Chest:     Chest wall: No tenderness.  Abdominal:     General: Bowel sounds are normal. There is no distension.     Palpations: Abdomen is soft. There is no mass.     Tenderness: There is no abdominal tenderness. There is no guarding or rebound.  Musculoskeletal:        General: No tenderness. Normal range of motion.     Cervical  back: Normal range of motion.     Right lower leg: No edema.     Left lower leg: No edema.  Lymphadenopathy:     Cervical: No cervical adenopathy.  Skin:    General: Skin is warm and dry.     Findings: Lesion present. No bruising, erythema or rash.  Neurological:     Mental Status: He is alert and oriented to person, place, and time.     Cranial Nerves: No cranial nerve deficit.     Motor: No abnormal muscle tone.     Coordination: Coordination normal.     Gait: Gait normal.     Deep Tendon Reflexes: Reflexes are normal and symmetric.  Psychiatric:        Behavior: Behavior normal.        Thought Content: Thought content normal.        Judgment: Judgment normal.   R lower lat leg ?insect bite - small scab x2  Lab Results  Component Value Date   WBC 4.1 12/06/2023   HGB 11.2 (L) 12/06/2023   HCT 32.8 (L) 12/06/2023   PLT 96.0 (L) 12/06/2023   GLUCOSE 105 (H) 12/06/2023   CHOL 157  08/20/2018   TRIG 79.0 08/20/2018   HDL 84.20 08/20/2018   LDLCALC 57 08/20/2018   ALT 18 12/06/2023   AST 33 12/06/2023   NA 141 12/06/2023   K 3.6 12/06/2023   CL 109 12/06/2023   CREATININE 1.38 12/06/2023   BUN 21 12/06/2023   CO2 25 12/06/2023   TSH 2.43 03/24/2020   PSA 1.76 08/20/2018   INR 1.2 02/16/2022   HGBA1C 4.6 04/03/2018    MR Abdomen W Wo Contrast Result Date: 03/27/2022 CLINICAL DATA:  Abnormal CT, cirrhosis EXAM: MRI ABDOMEN WITHOUT AND WITH CONTRAST TECHNIQUE: Multiplanar multisequence MR imaging of the abdomen was performed both before and after the administration of intravenous contrast. CONTRAST:  14mL MULTIHANCE  GADOBENATE DIMEGLUMINE  529 MG/ML IV SOLN COMPARISON:  CT abdomen 02/18/2022 FINDINGS: Lower chest: No acute findings. Hepatobiliary: Liver is nodular with diffuse heterogeneous parenchyma, consistent with cirrhosis. Diffusely inhomogeneous appearance of the parenchyma on postcontrast sequences with no focal arterially enhancing/delayed washout mass identified. There is a subcentimeter cyst in the inferior right lobe segment 4/5. Gallbladder is nearly filled with small stones. No wall thickening or pericholecystic edema. No biliary ductal dilatation. Pancreas: No mass, inflammatory changes, or other parenchymal abnormality identified. Spleen:  Enlarged measuring up to 13.2 cm in length. Adrenals/Urinary Tract: Adrenal glands appear normal. Small cyst in the lower pole left kidney. No hydronephrosis. Stomach/Bowel: Colonic diverticulosis. Vascular/Lymphatic: No pathologically enlarged lymph nodes identified. 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 No abdominal aortic aneurysm demonstrated. Other:  No ascites. Musculoskeletal: No suspicious bone lesions identified. IMPRESSION: 1. Hepatic cirrhosis with no suspicious mass identified. Small hepatic cyst. 2. 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 3.  Colonic diverticulosis. Electronically Signed   By: Catarina Pouch M.D.   On: 03/27/2022 18:24    Assessment & Plan:   Problem List Items Addressed This Visit     B12 deficiency   On B12      Cirrhosis of liver (HCC)   F/u w/Dr JONELLE Bring  Monitor labs, AFP      Relevant Orders   Comprehensive metabolic panel with GFR   CBC with Differential/Platelet   Insect bite - Primary   R lower lat leg ?insect bite 2 days ago: better. Dressed w/abx oint/bandaid Keflex po  if worse         No orders of the defined types were placed in this encounter.     Follow-up: Return in about 3 months (around 09/09/2024) for a follow-up visit.  Marolyn Noel, MD

## 2024-06-10 NOTE — Assessment & Plan Note (Signed)
 On B12

## 2024-06-10 NOTE — Assessment & Plan Note (Signed)
 F/u w/Dr JONELLE Bring  Monitor labs, AFP

## 2024-06-27 ENCOUNTER — Ambulatory Visit (INDEPENDENT_AMBULATORY_CARE_PROVIDER_SITE_OTHER)

## 2024-06-27 VITALS — Ht 72.0 in | Wt 171.0 lb

## 2024-06-27 DIAGNOSIS — Z0000 Encounter for general adult medical examination without abnormal findings: Secondary | ICD-10-CM

## 2024-06-27 NOTE — Patient Instructions (Addendum)
 Johnny Mcknight,  Thank you for taking the time for your Medicare Wellness Visit. I appreciate your continued commitment to your health goals. Please review the care plan we discussed, and feel free to reach out if I can assist you further.  Medicare recommends these wellness visits once per year to help you and your care team stay ahead of potential health issues. These visits are designed to focus on prevention, allowing your provider to concentrate on managing your acute and chronic conditions during your regular appointments.  Please note that Annual Wellness Visits do not include a physical exam. Some assessments may be limited, especially if the visit was conducted virtually. If needed, we may recommend a separate in-person follow-up with your provider.  Ongoing Care Seeing your primary care provider every 3 to 6 months helps us  monitor your health and provide consistent, personalized care.   Referrals If a referral was made during today's visit and you haven't received any updates within two weeks, please contact the referred provider directly to check on the status.  Recommended Screenings:  Health Maintenance  Topic Date Due   DTaP/Tdap/Td vaccine (1 - Tdap) Never done   Pneumococcal Vaccine for age over 6 (2 of 2 - PCV) 09/24/2021   Medicare Annual Wellness Visit  06/27/2025   Colon Cancer Screening  03/02/2032   Hepatitis C Screening  Completed   Meningitis B Vaccine  Aged Out   Flu Shot  Discontinued   COVID-19 Vaccine  Discontinued   Zoster (Shingles) Vaccine  Discontinued       06/19/2023    9:53 AM  Advanced Directives  Does Patient Have a Medical Advance Directive? No   Advance Care Planning is important because it: Ensures you receive medical care that aligns with your values, goals, and preferences. Provides guidance to your family and loved ones, reducing the emotional burden of decision-making during critical moments.  Vision: Annual vision screenings are  recommended for early detection of glaucoma, cataracts, and diabetic retinopathy. These exams can also reveal signs of chronic conditions such as diabetes and high blood pressure.  Dental: Annual dental screenings help detect early signs of oral cancer, gum disease, and other conditions linked to overall health, including heart disease and diabetes.

## 2024-06-27 NOTE — Progress Notes (Addendum)
 Subjective:   Johnny Mcknight is a 68 y.o. who presents for a Medicare Wellness preventive visit.  As a reminder, Annual Wellness Visits don't include a physical exam, and some assessments may be limited, especially if this visit is performed virtually. We may recommend an in-person follow-up visit with your provider if needed.  Visit Complete: Virtual I connected with  Johnny Mcknight on 06/27/24 by a audio enabled telemedicine application and verified that I am speaking with the correct person using two identifiers.  Patient Location: Home  Provider Location: Office/Clinic  I discussed the limitations of evaluation and management by telemedicine. The patient expressed understanding and agreed to proceed.  Vital Signs: Because this visit was a virtual/telehealth visit, some criteria may be missing or patient reported. Any vitals not documented were not able to be obtained and vitals that have been documented are patient reported.  VideoDeclined- This patient declined Librarian, academic. Therefore the visit was completed with audio only.  Persons Participating in Visit: Patient.  AWV Questionnaire: No: Patient Medicare AWV questionnaire was not completed prior to this visit.  Cardiac Risk Factors include: advanced age (>27men, >58 women);hypertension     Objective:    Today's Vitals   06/27/24 0946  Weight: 171 lb (77.6 kg)  Height: 6' (1.829 m)   Body mass index is 23.19 kg/m.     06/27/2024    9:56 AM 06/19/2023    9:53 AM 05/30/2022    9:50 AM 03/05/2022   12:47 PM 03/02/2022    1:28 PM 02/28/2022    5:44 PM 02/28/2022    2:30 PM  Advanced Directives  Does Patient Have a Medical Advance Directive? No No No No No No No  Would patient like information on creating a medical advance directive? No - Patient declined  No - Patient declined No - Patient declined  No - Patient declined     Current Medications (verified) Outpatient Encounter Medications as of  06/27/2024  Medication Sig   B Complex-Folic Acid  (B COMPLEX PLUS) TABS Take 1 tablet by mouth daily.   Cholecalciferol (VITAMIN D3) 50 MCG (2000 UT) capsule Take 1 capsule (2,000 Units total) by mouth daily.   hydrocortisone  (ANUSOL -HC) 2.5 % rectal cream Place rectally 2 (two) times daily.   Iron , Ferrous Sulfate , 325 (65 Fe) MG TABS Take 325 mg by mouth daily.   meloxicam  (MOBIC ) 15 MG tablet Take 15 mg by mouth daily.   pantoprazole  (PROTONIX ) 40 MG tablet Take 1 tablet (40 mg total) by mouth daily.   promethazine  (PHENERGAN ) 25 MG tablet TAKE 1 TABLET BY MOUTH EVERY 8 HOURS AS NEEDED FOR NAUSEA FOR VOMITING OR  USE  FOR  HICCUPS   triamcinolone  cream (KENALOG ) 0.5 % Apply topically 3 (three) times daily.   Zinc  100 MG TABS 1 po qd   No facility-administered encounter medications on file as of 06/27/2024.    Allergies (verified) Doxycycline, Omeprazole -sodium bicarbonate, and Propranolol   History: Past Medical History:  Diagnosis Date   GERD (gastroesophageal reflux disease)    Polyneuropathy, alcoholic 03/23/2017   Gabapentin  prn d/c B complex po 11/22 Re-start B complex po Risks associated with treatment noncompliance were discussed. Compliance was encouraged. 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  On B complex Risks associated with treatment noncompliance were discussed. Compliance was encouraged.    Vitamin D  deficiency 05/14/2018   2019   Zinc  deficiency 04/03/2020   2021 Zinc   220 mg bid Risks associated with treatment noncompliance were discussed. Compliance was encouraged.   Past Surgical History:  Procedure Laterality Date   BIOPSY  02/18/2022   Procedure: BIOPSY;  Surgeon: Charlanne Groom, MD;  Location: [facility];  Service: Gastroenterology;;   BIOPSY  03/02/2022   Procedure: BIOPSY;  Surgeon: Eda Iha, MD;  Location: [facility];  Service: Gastroenterology;;   BIOPSY  03/05/2022   Procedure:  BIOPSY;  Surgeon: Eda Iha, MD;  Location: [facility];  Service: Gastroenterology;;   COLONOSCOPY WITH PROPOFOL  N/A 02/18/2022   Procedure: COLONOSCOPY WITH PROPOFOL ;  Surgeon: Charlanne Groom, MD;  Location: [facility];  Service: Gastroenterology;  Laterality: N/A;   COLONOSCOPY WITH PROPOFOL  N/A 03/02/2022   Procedure: COLONOSCOPY WITH PROPOFOL ;  Surgeon: Eda Iha, MD;  Location: [facility];  Service: Gastroenterology;  Laterality: N/A;   ENTEROSCOPY N/A 03/05/2022   Procedure: ENTEROSCOPY;  Surgeon: Eda Iha, MD;  Location: [facility];  Service: Gastroenterology;  Laterality: N/A;   ESOPHAGOGASTRODUODENOSCOPY (EGD) WITH PROPOFOL  N/A 02/18/2022   Procedure: ESOPHAGOGASTRODUODENOSCOPY (EGD) WITH PROPOFOL ;  Surgeon: Charlanne Groom, MD;  Location: [facility];  Service: Gastroenterology;  Laterality: N/A;   ESOPHAGOGASTRODUODENOSCOPY (EGD) WITH PROPOFOL  N/A 03/03/2022   Procedure: ESOPHAGOGASTRODUODENOSCOPY (EGD) WITH PROPOFOL ;  Surgeon: Eda Iha, MD;  Location: [facility];  Service: Gastroenterology;  Laterality: N/A;   GIVENS CAPSULE STUDY  03/03/2022   Procedure: GIVENS CAPSULE STUDY;  Surgeon: Eda Iha, MD;  Location: [facility];  Service: Gastroenterology;;   HOT HEMOSTASIS N/A 03/05/2022   Procedure: HOT HEMOSTASIS (ARGON PLASMA COAGULATION/BICAP);  Surgeon: Eda Iha, MD;  Location: [facility];  Service: Gastroenterology;  Laterality: N/A;   POLYPECTOMY  02/18/2022   Procedure: POLYPECTOMY;  Surgeon: Charlanne Groom, MD;  Location: [facility];  Service: Gastroenterology;;   TEE WITHOUT CARDIOVERSION N/A 09/29/2015   Procedure: TRANSESOPHAGEAL ECHOCARDIOGRAM (TEE);  Surgeon: Oneil JAYSON Parchment, MD;  Location: [facility];  Service: Cardiovascular;  Laterality: N/A;   Family History  Problem Relation Age of Onset   Hypertension Other    Heart disease Father 5       MI   Hypertension Father    Hypertension Mother    Diabetes Mother     Diabetes Maternal Aunt    Stroke Maternal Aunt    Diabetes Maternal Grandmother    Social History   Socioeconomic History   Marital status: Widowed    Spouse name: Not on file   Number of children: 0   Years of education: Not on file   Highest education level: Not on file  Occupational History   Occupation: Disabled.  Tobacco Use   Smoking status: Some Days    Types: Pipe   Smokeless tobacco: Never  Vaping Use   Vaping status: Never Used  Substance and Sexual Activity   Alcohol use: Not Currently   Drug use: No   Sexual activity: Yes  Other Topics Concern   Not on file  Social History Narrative   Lives with someone.   Social Drivers of Corporate investment banker Strain: Low Risk  (06/27/2024)   Overall Financial Resource Strain (CARDIA)    Difficulty of Paying Living Expenses: Not hard at all  Food Insecurity: No Food Insecurity (06/27/2024)   Hunger Vital Sign    Worried About Running Out of Food in the Last Year: Never true    Ran Out of Food in the Last Year: Never true  Transportation Needs: No Transportation Needs (06/27/2024)   PRAPARE - Transportation    Lack of  Transportation (Medical): No    Lack of Transportation (Non-Medical): No  Physical Activity: Inactive (06/27/2024)   Exercise Vital Sign    Days of Exercise per Week: 0 days    Minutes of Exercise per Session: 0 min  Stress: No Stress Concern Present (06/27/2024)   [employer] of Occupational Health - Occupational Stress Questionnaire    Feeling of Stress: Not at all  Social Connections: Moderately Isolated (06/27/2024)   Social Connection and Isolation Panel    Frequency of Communication with Friends and Family: More than three times a week    Frequency of Social Gatherings with Friends and Family: Three times a week    Attends Religious Services: 1 to 4 times per year    Active Member of Clubs or Organizations: No    Attends Banker Meetings: Never    Marital Status: Widowed     Tobacco Counseling Ready to quit: No Counseling given: Yes    Clinical Intake:  Pre-visit preparation completed: Yes  Pain : No/denies pain     BMI - recorded: 23.19 Nutritional Status: BMI of 19-24  Normal Nutritional Risks: None Diabetes: No  Lab Results  Component Value Date   HGBA1C 4.6 04/03/2018   HGBA1C 5.7 09/15/2009     How often do you need to have someone help you when you read instructions, pamphlets, or other written materials from your doctor or pharmacy?: 1 - Never  Interpreter Needed?: No  Information entered by :: Verdie Saba, CMA   Activities of Daily Living     06/27/2024    9:48 AM  In your present state of health, do you have any difficulty performing the following activities:  Hearing? 0  Vision? 0  Difficulty concentrating or making decisions? 0  Walking or climbing stairs? 0  Dressing or bathing? 0  Doing errands, shopping? 0  Preparing Food and eating ? N  Using the Toilet? N  In the past six months, have you accidently leaked urine? N  Do you have problems with loss of bowel control? N  Managing your Medications? N  Managing your Finances? N  Housekeeping or managing your Housekeeping? N    Patient Care Team: Plotnikov, Karlynn GAILS, MD as PCP - General (Internal Medicine) Kay Kemps, MD as Consulting Physician (Orthopedic Surgery)  I have updated your Care Teams any recent Medical Services you may have received from other providers in the past year.     Assessment:   This is a routine wellness examination for Johnny Mcknight.  Hearing/Vision screen Hearing Screening - Comments:: Denies hearing difficulties   Vision Screening - Comments:: Wears rx glasses    Goals Addressed               This Visit's Progress     Patient Stated (pt-stated)        Patient stated he plans to stay active       Depression Screen     06/27/2024    9:49 AM 06/10/2024   10:20 AM 06/19/2023    9:56 AM 06/08/2023   10:52 AM 04/12/2023    10:08 AM 01/11/2023   10:07 AM 09/21/2022    9:13 AM  PHQ 2/9 Scores  PHQ - 2 Score 0 0 0 0 0 0 0  PHQ- 9 Score 0  1    0    Fall Risk     06/27/2024    9:57 AM 06/10/2024   10:19 AM 06/19/2023    9:53 AM 06/08/2023  10:52 AM 04/12/2023   10:08 AM  Fall Risk   Falls in the past year? 0 0 0 0 0  Number falls in past yr: 0 0 0 0 0  Injury with Fall? 0 0 0 0 0  Risk for fall due to : No Fall Risks No Fall Risks No Fall Risks No Fall Risks No Fall Risks  Follow up Falls evaluation completed;Falls prevention discussed Falls evaluation completed Falls prevention discussed;Falls evaluation completed Falls evaluation completed Falls evaluation completed    MEDICARE RISK AT HOME:  Medicare Risk at Home Any stairs in or around the home?: No If so, are there any without handrails?: No Home free of loose throw rugs in walkways, pet beds, electrical cords, etc?: Yes Adequate lighting in your home to reduce risk of falls?: Yes Life alert?: No Use of a cane, walker or w/c?: No Grab bars in the bathroom?: No Shower chair or bench in shower?: Yes Elevated toilet seat or a handicapped toilet?: No  TIMED UP AND GO:  Was the test performed?  No  Cognitive Function: 6CIT completed        06/27/2024    9:56 AM 06/19/2023    9:54 AM 05/30/2022   11:11 AM  6CIT Screen  What Year? 0 points 0 points 0 points  What month? 0 points 0 points 0 points  What time? 0 points 0 points 0 points  Count back from 20 0 points 0 points 0 points  Months in reverse 0 points 0 points 0 points  Repeat phrase 0 points 0 points 0 points  Total Score 0 points 0 points 0 points    Immunizations Immunization History  Administered Date(s) Administered   Fluad Trivalent(High Dose 65+) 06/08/2023   Influenza,inj,Quad PF,6+ Mos 06/16/2022   PFIZER(Purple Top)SARS-COV-2 Vaccination 12/19/2019, 01/01/2020   Pneumococcal Polysaccharide-23 09/24/2020    Screening Tests Health Maintenance  Topic Date Due    DTaP/Tdap/Td (1 - Tdap) Never done   Pneumococcal Vaccine: 50+ Years (2 of 2 - PCV) 09/24/2021   Medicare Annual Wellness (AWV)  06/27/2025   Colonoscopy  03/02/2032   Hepatitis C Screening  Completed   Meningococcal B Vaccine  Aged Out   Influenza Vaccine  Discontinued   COVID-19 Vaccine  Discontinued   Zoster Vaccines- Shingrix  Discontinued    Health Maintenance Items Addressed:  06/27/2024  Additional Screening:  Vision Screening: Recommended annual ophthalmology exams for early detection of glaucoma and other disorders of the eye. Is the patient up to date with their annual eye exam?  No  Who is the provider or what is the name of the office in which the patient attends annual eye exams? Unable to recall name of Ophthalmologist   Dental Screening: Recommended annual dental exams for proper oral hygiene  Community Resource Referral / Chronic Care Management: CRR required this visit?  No   CCM required this visit?  No   Plan:    I have personally reviewed and noted the following in the patient's chart:   Medical and social history Use of alcohol, tobacco or illicit drugs  Current medications and supplements including opioid prescriptions. Patient is not currently taking opioid prescriptions. Functional ability and status Nutritional status Physical activity Advanced directives List of other physicians Hospitalizations, surgeries, and ER visits in previous 12 months Vitals Screenings to include cognitive, depression, and falls Referrals and appointments  In addition, I have reviewed and discussed with patient certain preventive protocols, quality metrics, and best practice recommendations. A written  personalized care plan for preventive services as well as general preventive health recommendations were provided to patient.   Verdie CHRISTELLA Saba, CMA   06/27/2024   After Visit Summary: (MyChart) Due to this being a telephonic visit, the after visit summary with patients  personalized plan was offered to patient via MyChart   Notes: Nothing significant to report at this time.  Medical screening examination/treatment/procedure(s) were performed by non-physician practitioner and as supervising physician I was immediately available for consultation/collaboration.  I agree with above. Karlynn Noel, MD

## 2024-07-26 DIAGNOSIS — G709 Myoneural disorder, unspecified: Secondary | ICD-10-CM | POA: Diagnosis not present

## 2024-07-26 DIAGNOSIS — I2510 Atherosclerotic heart disease of native coronary artery without angina pectoris: Secondary | ICD-10-CM | POA: Diagnosis not present

## 2024-07-26 DIAGNOSIS — K7030 Alcoholic cirrhosis of liver without ascites: Secondary | ICD-10-CM | POA: Diagnosis not present

## 2024-07-26 DIAGNOSIS — K760 Fatty (change of) liver, not elsewhere classified: Secondary | ICD-10-CM | POA: Diagnosis not present

## 2024-07-26 DIAGNOSIS — K7010 Alcoholic hepatitis without ascites: Secondary | ICD-10-CM | POA: Diagnosis not present

## 2024-07-26 DIAGNOSIS — F1020 Alcohol dependence, uncomplicated: Secondary | ICD-10-CM | POA: Diagnosis not present

## 2024-07-26 DIAGNOSIS — K766 Portal hypertension: Secondary | ICD-10-CM | POA: Diagnosis not present

## 2024-07-26 DIAGNOSIS — G621 Alcoholic polyneuropathy: Secondary | ICD-10-CM | POA: Diagnosis not present

## 2024-07-26 DIAGNOSIS — D696 Thrombocytopenia, unspecified: Secondary | ICD-10-CM | POA: Diagnosis not present

## 2024-07-26 DIAGNOSIS — I099 Rheumatic heart disease, unspecified: Secondary | ICD-10-CM | POA: Diagnosis not present

## 2024-07-26 DIAGNOSIS — Z791 Long term (current) use of non-steroidal anti-inflammatories (NSAID): Secondary | ICD-10-CM | POA: Diagnosis not present

## 2024-07-26 DIAGNOSIS — Z818 Family history of other mental and behavioral disorders: Secondary | ICD-10-CM | POA: Diagnosis not present

## 2024-07-26 DIAGNOSIS — M1990 Unspecified osteoarthritis, unspecified site: Secondary | ICD-10-CM | POA: Diagnosis not present

## 2024-07-26 DIAGNOSIS — N189 Chronic kidney disease, unspecified: Secondary | ICD-10-CM | POA: Diagnosis not present

## 2024-07-26 DIAGNOSIS — I700 Atherosclerosis of aorta: Secondary | ICD-10-CM | POA: Diagnosis not present

## 2024-07-26 DIAGNOSIS — I8510 Secondary esophageal varices without bleeding: Secondary | ICD-10-CM | POA: Diagnosis not present

## 2024-09-16 ENCOUNTER — Other Ambulatory Visit

## 2024-09-16 DIAGNOSIS — K7030 Alcoholic cirrhosis of liver without ascites: Secondary | ICD-10-CM | POA: Diagnosis not present

## 2024-09-16 LAB — CBC WITH DIFFERENTIAL/PLATELET
Basophils Absolute: 0 K/uL (ref 0.0–0.1)
Basophils Relative: 0.6 % (ref 0.0–3.0)
Eosinophils Absolute: 0.1 K/uL (ref 0.0–0.7)
Eosinophils Relative: 3.5 % (ref 0.0–5.0)
HCT: 35 % — ABNORMAL LOW (ref 39.0–52.0)
Hemoglobin: 11.9 g/dL — ABNORMAL LOW (ref 13.0–17.0)
Lymphocytes Relative: 24.6 % (ref 12.0–46.0)
Lymphs Abs: 0.8 K/uL (ref 0.7–4.0)
MCHC: 33.9 g/dL (ref 30.0–36.0)
MCV: 90.4 fl (ref 78.0–100.0)
Monocytes Absolute: 0.3 K/uL (ref 0.1–1.0)
Monocytes Relative: 10 % (ref 3.0–12.0)
Neutro Abs: 1.9 K/uL (ref 1.4–7.7)
Neutrophils Relative %: 61.3 % (ref 43.0–77.0)
Platelets: 76 K/uL — ABNORMAL LOW (ref 150.0–400.0)
RBC: 3.87 Mil/uL — ABNORMAL LOW (ref 4.22–5.81)
RDW: 15.5 % (ref 11.5–15.5)
WBC: 3.2 K/uL — ABNORMAL LOW (ref 4.0–10.5)

## 2024-09-16 LAB — COMPREHENSIVE METABOLIC PANEL WITH GFR
ALT: 15 U/L (ref 3–53)
AST: 26 U/L (ref 5–37)
Albumin: 4.2 g/dL (ref 3.5–5.2)
Alkaline Phosphatase: 129 U/L — ABNORMAL HIGH (ref 39–117)
BUN: 19 mg/dL (ref 6–23)
CO2: 26 meq/L (ref 19–32)
Calcium: 9.1 mg/dL (ref 8.4–10.5)
Chloride: 107 meq/L (ref 96–112)
Creatinine, Ser: 1.59 mg/dL — ABNORMAL HIGH (ref 0.40–1.50)
GFR: 45 mL/min — ABNORMAL LOW
Glucose, Bld: 125 mg/dL — ABNORMAL HIGH (ref 70–99)
Potassium: 3.8 meq/L (ref 3.5–5.1)
Sodium: 142 meq/L (ref 135–145)
Total Bilirubin: 1 mg/dL (ref 0.2–1.2)
Total Protein: 7.8 g/dL (ref 6.0–8.3)

## 2024-09-17 ENCOUNTER — Ambulatory Visit (INDEPENDENT_AMBULATORY_CARE_PROVIDER_SITE_OTHER): Admitting: Internal Medicine

## 2024-09-17 ENCOUNTER — Encounter: Payer: Self-pay | Admitting: Internal Medicine

## 2024-09-17 VITALS — Ht 72.0 in | Wt 175.0 lb

## 2024-09-17 DIAGNOSIS — M25562 Pain in left knee: Secondary | ICD-10-CM | POA: Diagnosis not present

## 2024-09-17 DIAGNOSIS — K7460 Unspecified cirrhosis of liver: Secondary | ICD-10-CM

## 2024-09-17 DIAGNOSIS — K7030 Alcoholic cirrhosis of liver without ascites: Secondary | ICD-10-CM

## 2024-09-17 DIAGNOSIS — E60 Dietary zinc deficiency: Secondary | ICD-10-CM | POA: Diagnosis not present

## 2024-09-17 DIAGNOSIS — E538 Deficiency of other specified B group vitamins: Secondary | ICD-10-CM

## 2024-09-17 DIAGNOSIS — G8929 Other chronic pain: Secondary | ICD-10-CM | POA: Diagnosis not present

## 2024-09-17 DIAGNOSIS — M25561 Pain in right knee: Secondary | ICD-10-CM

## 2024-09-17 DIAGNOSIS — R234 Changes in skin texture: Secondary | ICD-10-CM

## 2024-09-17 DIAGNOSIS — E559 Vitamin D deficiency, unspecified: Secondary | ICD-10-CM | POA: Diagnosis not present

## 2024-09-17 MED ORDER — MELOXICAM 15 MG PO TABS: 15.0000 mg | ORAL_TABLET | Freq: Every day | ORAL | 0 refills | Status: AC | PRN

## 2024-09-17 NOTE — Progress Notes (Signed)
 "  Subjective:  Patient ID: Johnny Mcknight, male    DOB: 01-Dec-1955  Age: 68 y.o. MRN: [id number]  CC: No chief complaint on file.   HPI Johnny Mcknight presents for R knee pain. He saw a careers adviser - not ready for TKR yet, GERD, liver cirrhosis, B12 def  Outpatient Medications Prior to Visit  Medication Sig Dispense Refill   B Complex-Folic Acid  (B COMPLEX PLUS) TABS Take 1 tablet by mouth daily. 100 tablet 3   Cholecalciferol (VITAMIN D3) 50 MCG (2000 UT) capsule Take 1 capsule (2,000 Units total) by mouth daily. 100 capsule 3   hydrocortisone  (ANUSOL -HC) 2.5 % rectal cream Place rectally 2 (two) times daily. 30 g 0   Iron , Ferrous Sulfate , 325 (65 Fe) MG TABS Take 325 mg by mouth daily. 90 tablet 3   pantoprazole  (PROTONIX ) 40 MG tablet Take 1 tablet (40 mg total) by mouth daily. 90 tablet 3   promethazine  (PHENERGAN ) 25 MG tablet TAKE 1 TABLET BY MOUTH EVERY 8 HOURS AS NEEDED FOR NAUSEA FOR VOMITING OR  USE  FOR  HICCUPS 60 tablet 2   triamcinolone  cream (KENALOG ) 0.5 % Apply topically 3 (three) times daily. 90 g 3   Zinc  100 MG TABS 1 po qd 100 tablet 3   meloxicam  (MOBIC ) 15 MG tablet Take 15 mg by mouth daily.     No facility-administered medications prior to visit.    ROS: Review of Systems  Constitutional:  Negative for appetite change, fatigue and unexpected weight change.  HENT:  Negative for congestion, nosebleeds, sneezing, sore throat and trouble swallowing.   Eyes:  Negative for itching and visual disturbance.  Respiratory:  Negative for cough.   Cardiovascular:  Negative for chest pain, palpitations and leg swelling.  Gastrointestinal:  Negative for abdominal distention, blood in stool, diarrhea and nausea.  Genitourinary:  Negative for frequency and hematuria.  Musculoskeletal:  Positive for arthralgias and gait problem. Negative for back pain, joint swelling and neck pain.  Skin:  Negative for rash.  Neurological:  Negative for dizziness, tremors, speech difficulty and  weakness.  Psychiatric/Behavioral:  Negative for agitation, decreased concentration, dysphoric mood, sleep disturbance and suicidal ideas. The patient is not nervous/anxious.     Objective:  Ht 6' (1.829 m)   Wt 175 lb (79.4 kg)   BMI 23.73 kg/m   BP Readings from Last 3 Encounters:  06/10/24 130/82  03/07/24 130/72  12/06/23 120/70    Wt Readings from Last 3 Encounters:  09/17/24 175 lb (79.4 kg)  06/27/24 171 lb (77.6 kg)  06/10/24 171 lb 9.6 oz (77.8 kg)    Physical Exam Constitutional:      General: He is not in acute distress.    Appearance: Normal appearance. He is well-developed.     Comments: NAD  Eyes:     Conjunctiva/sclera: Conjunctivae normal.     Pupils: Pupils are equal, round, and reactive to light.  Neck:     Thyroid : No thyromegaly.     Vascular: No JVD.  Cardiovascular:     Rate and Rhythm: Normal rate and regular rhythm.     Heart sounds: Normal heart sounds. No murmur heard.    No friction rub. No gallop.  Pulmonary:     Effort: Pulmonary effort is normal. No respiratory distress.     Breath sounds: Normal breath sounds. No wheezing or rales.  Chest:     Chest wall: No tenderness.  Abdominal:     General: Bowel sounds are normal. There is  no distension.     Palpations: Abdomen is soft. There is no mass.     Tenderness: There is no abdominal tenderness. There is no guarding or rebound.  Musculoskeletal:        General: Tenderness present. Normal range of motion.     Cervical back: Normal range of motion.  Lymphadenopathy:     Cervical: No cervical adenopathy.  Skin:    General: Skin is warm and dry.     Findings: No rash.  Neurological:     Mental Status: He is alert and oriented to person, place, and time.     Cranial Nerves: No cranial nerve deficit.     Motor: No abnormal muscle tone.     Coordination: Coordination normal.     Gait: Gait normal.     Deep Tendon Reflexes: Reflexes are normal and symmetric.  Psychiatric:         Behavior: Behavior normal.        Thought Content: Thought content normal.        Judgment: Judgment normal.    R knee w/pain on ROM   Lab Results  Component Value Date   WBC 3.2 (L) 09/16/2024   HGB 11.9 (L) 09/16/2024   HCT 35.0 (L) 09/16/2024   PLT 76.0 (L) 09/16/2024   GLUCOSE 125 (H) 09/16/2024   CHOL 157 08/20/2018   TRIG 79.0 08/20/2018   HDL 84.20 08/20/2018   LDLCALC 57 08/20/2018   ALT 15 09/16/2024   AST 26 09/16/2024   NA 142 09/16/2024   K 3.8 09/16/2024   CL 107 09/16/2024   CREATININE 1.59 (H) 09/16/2024   BUN 19 09/16/2024   CO2 26 09/16/2024   TSH 2.43 03/24/2020   PSA 1.76 08/20/2018   INR 1.2 02/16/2022   HGBA1C 4.6 04/03/2018    MR Abdomen W Wo Contrast Result Date: 03/27/2022 CLINICAL DATA:  Abnormal CT, cirrhosis EXAM: MRI ABDOMEN WITHOUT AND WITH CONTRAST TECHNIQUE: Multiplanar multisequence MR imaging of the abdomen was performed both before and after the administration of intravenous contrast. CONTRAST:  14mL MULTIHANCE  GADOBENATE DIMEGLUMINE  529 MG/ML IV SOLN COMPARISON:  CT abdomen 02/18/2022 FINDINGS: Lower chest: No acute findings. Hepatobiliary: Liver is nodular with diffuse heterogeneous parenchyma, consistent with cirrhosis. Diffusely inhomogeneous appearance of the parenchyma on postcontrast sequences with no focal arterially enhancing/delayed washout mass identified. There is a subcentimeter cyst in the inferior right lobe segment 4/5. Gallbladder is nearly filled with small stones. No wall thickening or pericholecystic edema. No biliary ductal dilatation. Pancreas: No mass, inflammatory changes, or other parenchymal abnormality identified. Spleen:  Enlarged measuring up to 13.2 cm in length. Adrenals/Urinary Tract: Adrenal glands appear normal. Small cyst in the lower pole left kidney. No hydronephrosis. Stomach/Bowel: Colonic diverticulosis. Vascular/Lymphatic: No pathologically enlarged lymph nodes identified. Small varices mostly in the left  upper abdomen including gastroesophageal varices. No abdominal aortic aneurysm demonstrated. Other:  No ascites. Musculoskeletal: No suspicious bone lesions identified. IMPRESSION: 1. Hepatic cirrhosis with no suspicious mass identified. Small hepatic cyst. 2. 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 3. Colonic diverticulosis. Electronically Signed   By: Catarina Pouch M.D.   On: 03/27/2022 18:24    Assessment & Plan:   Problem List Items Addressed This Visit     Knee pain, bilateral    R knee pain. He saw a careers adviser, Dr Kay - not ready for TKR yet F/u w/Ortho Meloxicam  rare use      B12 deficiency   On B12  Vitamin D  deficiency   On Vit D       Zinc  deficiency   Zinc  level was nl      Scaly skin - Primary   Rash is likely related to zinc  deficiency.  Increase Zinc  intake      Cirrhosis of liver (HCC)   Liver US  Shingrix adviced         Meds ordered this encounter  Medications   meloxicam  (MOBIC ) 15 MG tablet    Sig: Take 1 tablet (15 mg total) by mouth daily as needed for pain.    Dispense:  30 tablet    Refill:  0      Follow-up: Return in about 4 months (around 01/16/2025) for a follow-up visit.  Marolyn Noel, MD "

## 2024-09-17 NOTE — Assessment & Plan Note (Signed)
 On Vit D

## 2024-09-17 NOTE — Assessment & Plan Note (Signed)
Rash is likely related to zinc deficiency.  Increase Zinc intake

## 2024-09-17 NOTE — Assessment & Plan Note (Signed)
 Zinc level was nl

## 2024-09-17 NOTE — Assessment & Plan Note (Signed)
 On B12

## 2024-09-17 NOTE — Patient Instructions (Signed)

## 2024-09-17 NOTE — Assessment & Plan Note (Addendum)
 Liver US  Shingrix adviced

## 2024-09-17 NOTE — Assessment & Plan Note (Signed)
"   R knee pain. He saw a careers adviser, Dr Kay - not ready for TKR yet F/u w/Ortho Meloxicam  rare use "

## 2025-01-16 ENCOUNTER — Ambulatory Visit: Admitting: Internal Medicine

## 2025-07-01 ENCOUNTER — Ambulatory Visit

## 2025-07-01 ENCOUNTER — Encounter: Admitting: Internal Medicine
